# Patient Record
Sex: Female | Born: 1950
Health system: Southern US, Community
[De-identification: ages and names within clinical notes are randomized; demographics above are authoritative.]

## PROBLEM LIST (undated history)

## (undated) DIAGNOSIS — Z923 Personal history of irradiation: Secondary | ICD-10-CM

## (undated) DIAGNOSIS — M8580 Other specified disorders of bone density and structure, unspecified site: Secondary | ICD-10-CM

## (undated) DIAGNOSIS — C50212 Malignant neoplasm of upper-inner quadrant of left female breast: Secondary | ICD-10-CM

## (undated) DIAGNOSIS — I10 Essential (primary) hypertension: Secondary | ICD-10-CM

## (undated) DIAGNOSIS — IMO0001 Reserved for inherently not codable concepts without codable children: Secondary | ICD-10-CM

## (undated) DIAGNOSIS — IMO0002 Reserved for concepts with insufficient information to code with codable children: Secondary | ICD-10-CM

## (undated) HISTORY — DX: Malignant neoplasm of upper-inner quadrant of left female breast: C50.212

## (undated) HISTORY — DX: Reserved for inherently not codable concepts without codable children: IMO0001

## (undated) HISTORY — DX: Other specified disorders of bone density and structure, unspecified site: M85.80

## (undated) HISTORY — DX: Reserved for concepts with insufficient information to code with codable children: IMO0002

---
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
## 1978-06-14 HISTORY — PX: ABDOMINAL HYSTERECTOMY: SHX81

## 2007-08-07 ENCOUNTER — Emergency Department (HOSPITAL_COMMUNITY): Admission: EM | Admit: 2007-08-07 | Discharge: 2007-08-07 | Payer: Self-pay | Admitting: Emergency Medicine

## 2009-03-06 ENCOUNTER — Emergency Department (HOSPITAL_COMMUNITY): Admission: EM | Admit: 2009-03-06 | Discharge: 2009-03-06 | Payer: Self-pay | Admitting: Emergency Medicine

## 2010-03-24 ENCOUNTER — Encounter (INDEPENDENT_AMBULATORY_CARE_PROVIDER_SITE_OTHER): Payer: Self-pay | Admitting: *Deleted

## 2010-03-24 ENCOUNTER — Ambulatory Visit: Payer: Self-pay | Admitting: Internal Medicine

## 2010-03-24 LAB — CONVERTED CEMR LAB
ALT: 12 units/L (ref 0–35)
AST: 16 units/L (ref 0–37)
Basophils Absolute: 0 10*3/uL (ref 0.0–0.1)
Basophils Relative: 0 % (ref 0–1)
CO2: 28 meq/L (ref 19–32)
Calcium: 9.9 mg/dL (ref 8.4–10.5)
Chloride: 104 meq/L (ref 96–112)
Cholesterol: 178 mg/dL (ref 0–200)
Hemoglobin: 13.4 g/dL (ref 12.0–15.0)
Lymphocytes Relative: 41 % (ref 12–46)
Neutro Abs: 3.6 10*3/uL (ref 1.7–7.7)
Neutrophils Relative %: 50 % (ref 43–77)
Platelets: 317 10*3/uL (ref 150–400)
Potassium: 4.6 meq/L (ref 3.5–5.3)
RDW: 14.3 % (ref 11.5–15.5)
Sodium: 144 meq/L (ref 135–145)
TSH: 1.228 microintl units/mL (ref 0.350–4.500)
Total Protein: 7.7 g/dL (ref 6.0–8.3)
VLDL: 23 mg/dL (ref 0–40)

## 2010-05-15 ENCOUNTER — Encounter (INDEPENDENT_AMBULATORY_CARE_PROVIDER_SITE_OTHER): Payer: Self-pay | Admitting: *Deleted

## 2010-05-15 LAB — CONVERTED CEMR LAB: Microalb, Ur: 5.42 mg/dL — ABNORMAL HIGH (ref 0.00–1.89)

## 2010-05-21 ENCOUNTER — Ambulatory Visit (HOSPITAL_COMMUNITY)
Admission: RE | Admit: 2010-05-21 | Discharge: 2010-05-21 | Payer: Self-pay | Source: Home / Self Care | Attending: Internal Medicine | Admitting: Internal Medicine

## 2010-09-18 LAB — CBC
MCV: 88 fL (ref 78.0–100.0)
Platelets: 285 10*3/uL (ref 150–400)
RBC: 5.22 MIL/uL — ABNORMAL HIGH (ref 3.87–5.11)
WBC: 10.3 10*3/uL (ref 4.0–10.5)

## 2010-09-18 LAB — DIFFERENTIAL
Eosinophils Absolute: 0.1 10*3/uL (ref 0.0–0.7)
Lymphocytes Relative: 26 % (ref 12–46)
Lymphs Abs: 2.7 10*3/uL (ref 0.7–4.0)
Monocytes Relative: 6 % (ref 3–12)
Neutrophils Relative %: 67 % (ref 43–77)

## 2010-09-18 LAB — URINALYSIS, ROUTINE W REFLEX MICROSCOPIC
Glucose, UA: NEGATIVE mg/dL
Specific Gravity, Urine: 1.026 (ref 1.005–1.030)
pH: 5.5 (ref 5.0–8.0)

## 2010-09-18 LAB — BASIC METABOLIC PANEL
BUN: 11 mg/dL (ref 6–23)
Chloride: 103 mEq/L (ref 96–112)
Creatinine, Ser: 0.96 mg/dL (ref 0.4–1.2)
GFR calc Af Amer: >60 mL/min (ref >60)
GFR calc non Af Amer: 60 mL/min — ABNORMAL LOW (ref >60)
Potassium: 3.5 mEq/L (ref 3.5–5.1)

## 2010-09-18 LAB — URINE MICROSCOPIC-ADD ON

## 2010-11-25 ENCOUNTER — Emergency Department (HOSPITAL_COMMUNITY)
Admission: EM | Admit: 2010-11-25 | Discharge: 2010-11-25 | Disposition: A | Discharge status: Home / Self Care | Payer: Self-pay | Attending: Emergency Medicine | Admitting: Emergency Medicine

## 2010-11-25 DIAGNOSIS — L2989 Other pruritus: Secondary | ICD-10-CM | POA: Insufficient documentation

## 2010-11-25 DIAGNOSIS — I10 Essential (primary) hypertension: Secondary | ICD-10-CM | POA: Insufficient documentation

## 2010-11-25 DIAGNOSIS — H9209 Otalgia, unspecified ear: Secondary | ICD-10-CM | POA: Insufficient documentation

## 2010-11-25 DIAGNOSIS — H9210 Otorrhea, unspecified ear: Secondary | ICD-10-CM | POA: Insufficient documentation

## 2010-11-25 DIAGNOSIS — L298 Other pruritus: Secondary | ICD-10-CM | POA: Insufficient documentation

## 2010-11-25 DIAGNOSIS — Z79899 Other long term (current) drug therapy: Secondary | ICD-10-CM | POA: Insufficient documentation

## 2010-12-12 ENCOUNTER — Emergency Department (HOSPITAL_COMMUNITY)
Admission: EM | Admit: 2010-12-12 | Discharge: 2010-12-12 | Disposition: A | Discharge status: Home / Self Care | Payer: Self-pay | Attending: Emergency Medicine | Admitting: Emergency Medicine

## 2010-12-12 DIAGNOSIS — L2989 Other pruritus: Secondary | ICD-10-CM | POA: Insufficient documentation

## 2010-12-12 DIAGNOSIS — R21 Rash and other nonspecific skin eruption: Secondary | ICD-10-CM | POA: Insufficient documentation

## 2010-12-12 DIAGNOSIS — I10 Essential (primary) hypertension: Secondary | ICD-10-CM | POA: Insufficient documentation

## 2010-12-12 DIAGNOSIS — IMO0002 Reserved for concepts with insufficient information to code with codable children: Secondary | ICD-10-CM | POA: Insufficient documentation

## 2010-12-12 DIAGNOSIS — L255 Unspecified contact dermatitis due to plants, except food: Secondary | ICD-10-CM | POA: Insufficient documentation

## 2010-12-12 DIAGNOSIS — Z79899 Other long term (current) drug therapy: Secondary | ICD-10-CM | POA: Insufficient documentation

## 2010-12-12 DIAGNOSIS — L298 Other pruritus: Secondary | ICD-10-CM | POA: Insufficient documentation

## 2013-04-08 ENCOUNTER — Emergency Department (HOSPITAL_COMMUNITY)
Admission: EM | Admit: 2013-04-08 | Discharge: 2013-04-08 | Disposition: A | Discharge status: Home / Self Care | Payer: BC Managed Care – PPO | Attending: Emergency Medicine | Admitting: Emergency Medicine

## 2013-04-08 ENCOUNTER — Encounter (HOSPITAL_COMMUNITY): Payer: Self-pay | Admitting: Emergency Medicine

## 2013-04-08 DIAGNOSIS — IMO0002 Reserved for concepts with insufficient information to code with codable children: Secondary | ICD-10-CM | POA: Insufficient documentation

## 2013-04-08 DIAGNOSIS — L0390 Cellulitis, unspecified: Secondary | ICD-10-CM

## 2013-04-08 MED ORDER — CEPHALEXIN 500 MG PO CAPS: 500.0000 mg | ORAL_CAPSULE | Freq: Four times a day (QID) | ORAL | Status: DC

## 2013-04-08 MED ORDER — CEFTRIAXONE SODIUM 1 G IJ SOLR
1.0000 g | Freq: Once | INTRAMUSCULAR | Status: AC
  Administered 2013-04-08: 1 g via INTRAMUSCULAR
  Filled 2013-04-08: qty 10

## 2013-04-08 NOTE — ED Notes (Signed)
Pt presents to department for evaluation of redness/swelling to inner L arm. Ongoing for several days. States she thinks this could be insect bite or bee sting. Respirations unlabored. Pt is alert and oriented x4.

## 2013-04-08 NOTE — ED Provider Notes (Signed)
CSN: [id number]     Arrival date & time 04/08/13  1214 History  This chart was scribed for non-physician practitioner, Wynetta Emery, PA-C,working with Doug Sou, MD, by Karle Plumber, ED Scribe.  This patient was seen in room TR05C/TR05C and the patient's care was started at 12:59 PM.  Chief Complaint  Patient presents with  . Allergic Reaction   The history is provided by the patient. No language interpreter was used.   HPI Comments:  Marissa Williams is a 62 y.o. female who presents to the Emergency Department complaining of an allergic reaction to a bee sting on her left inner arm onset two days ago. Pt states she is not allergic to bees, but she reports worsening itching and swelling. She states that since she is not allergic to bees she did not think she should have it looked at, but with the worsening symptoms she was advised by her supervisor to have it checked. Pt denies fever, nausea, vomiting, or red streaking to the arm. Pt denies any chronic illnesses or allergies to any medications.   No past medical history on file. History reviewed. No pertinent past surgical history. No family history on file. History  Substance Use Topics  . Smoking status: Never Smoker   . Smokeless tobacco: Not on file  . Alcohol Use: No   OB History   Grav Para Term Preterm Abortions TAB SAB Ect Mult Living                 Review of Systems A complete 10 system review of systems was obtained and all systems are negative except as noted in the HPI and PMH.   Allergies  Review of patient's allergies indicates no known allergies.  Home Medications  No current outpatient prescriptions on file. Triage Vitals: BP 155/88  Pulse 97  Temp(Src) 98.3 F (36.8 C) (Oral)  Resp 16  Wt 164 lb 8 oz (74.617 kg)  SpO2 98% Physical Exam  Nursing note and vitals reviewed. Constitutional: She is oriented to person, place, and time. She appears well-developed and well-nourished. No distress.  HENT:   Head: Normocephalic.  Eyes: Conjunctivae and EOM are normal.  Cardiovascular: Normal rate.   Pulmonary/Chest: Effort normal. No stridor.  Musculoskeletal: Normal range of motion.  Neurological: She is alert and oriented to person, place, and time.  Skin:  Expired 10 cm area of cellulitis to left upper medial arm. No signs of abscess or fluctuance.  Psychiatric: She has a normal mood and affect.    ED Course  Procedures (including critical care time) DIAGNOSTIC STUDIES: Oxygen Saturation is 98% on RA, normal by my interpretation.   COORDINATION OF CARE: 01:02 PM- Advised pt to take OTC Motrin for pain and inflammation. Will start pt on oral antibiotic and advised to return with worsened or new symptoms. Outlined area of erythema with pen and advised pt to keep a close eye to see if redness and swelling spread. Pt verbalizes understanding and agrees to plan.  Medications - No data to display  Labs Review Labs Reviewed - No data to display Imaging Review No results found.  EKG Interpretation   None       MDM  No diagnosis found.  Filed Vitals:   04/08/13 1217 04/08/13 1350  BP: 155/88 117/77  Pulse: 97 77  Temp: 98.3 F (36.8 C)   TempSrc: Oral   Resp: 16 15  Weight: 164 lb 8 oz (74.617 kg)   SpO2: 98% 97%  Marissa Williams is a 63 y.o. female with cellulitis to left upper arm after bee sting. No systemic complaints. Area is outlined and patient started on Keflex. We have discussed return precautions extensively. She has verbalized her understanding seems reliable to followup.  Medications  cefTRIAXone (ROCEPHIN) injection 1 g (1 g Intramuscular Given 04/08/13 1327)    Pt is hemodynamically stable, appropriate for, and amenable to discharge at this time. Pt verbalized understanding and agrees with care plan. All questions answered. Outpatient follow-up and specific return precautions discussed.    Discharge Medication List as of 04/08/2013  1:20 PM    START  taking these medications   Details  cephALEXin (KEFLEX) 500 MG capsule Take 1 capsule (500 mg total) by mouth 4 (four) times daily., Starting 04/08/2013, Until Discontinued, Print        I personally performed the services described in this documentation, which was scribed in my presence. The recorded information has been reviewed and is accurate.  Note: Portions of this report may have been transcribed using voice recognition software. Every effort was made to ensure accuracy; however, inadvertent computerized transcription errors may be present    Wynetta Emery, PA-C 04/11/13 [phone]

## 2013-04-12 NOTE — ED Provider Notes (Signed)
Medical screening examination/treatment/procedure(s) were performed by non-physician practitioner and as supervising physician I was immediately available for consultation/collaboration.  EKG Interpretation   None        Doug Sou, MD 04/12/13 [phone]

## 2014-01-17 ENCOUNTER — Other Ambulatory Visit: Payer: Self-pay | Admitting: Obstetrics and Gynecology

## 2014-01-17 DIAGNOSIS — Z1231 Encounter for screening mammogram for malignant neoplasm of breast: Secondary | ICD-10-CM

## 2014-02-12 ENCOUNTER — Ambulatory Visit (HOSPITAL_COMMUNITY)
Admission: RE | Admit: 2014-02-12 | Discharge: 2014-02-12 | Disposition: A | Discharge status: Home / Self Care | Payer: Self-pay | Source: Ambulatory Visit | Attending: Obstetrics and Gynecology | Admitting: Obstetrics and Gynecology

## 2014-02-12 ENCOUNTER — Encounter (HOSPITAL_COMMUNITY): Payer: Self-pay

## 2014-02-12 VITALS — BP 150/82 | Temp 97.3°F | Ht 63.0 in | Wt 165.2 lb

## 2014-02-12 DIAGNOSIS — Z1239 Encounter for other screening for malignant neoplasm of breast: Secondary | ICD-10-CM

## 2014-02-12 DIAGNOSIS — Z1231 Encounter for screening mammogram for malignant neoplasm of breast: Secondary | ICD-10-CM

## 2014-02-12 HISTORY — DX: Essential (primary) hypertension: I10

## 2014-02-12 NOTE — Progress Notes (Signed)
No complaints today.  Pap Smear:  Pap smear not completed today. Last Pap smear was 05/15/2010 and normal. Per patient has no history of an abnormal Pap smear. Patient has a history of a hysterectomy 34 years ago due to a prolapsed uterus. Patient no longer needs Pap smears due to her history of a hysterectomy for benign reasons per BCCCP and ACOG guidelines. Last Pap smear result is in EPIC.  Physical exam: Breasts Right breast is slightly larger than the left breast. No skin abnormalities bilateral breasts. No nipple retraction bilateral breasts. No nipple discharge bilateral breasts. No lymphadenopathy. No lumps palpated bilateral breasts. No complaints of pain or tenderness on exam. Patient escorted to mammography for a screening mammogram.        Pelvic/Bimanual No Pap smear completed today since patient has a history of a hysterectomy for benign reasons. Pap smear not indicated per BCCCP guidelines.

## 2014-02-12 NOTE — Patient Instructions (Signed)
Explained to Marissa Williams that she did not need a Pap smear today due to a history of a hysterectomy for benign reasons. Told patient that she does not need any further Pap smears due to her history of a hysterectomy for benign reasons and no history of an abnormal Pap smear. Let [facility] know [facility] will follow up with her within the next couple weeks with results by letter or phone. Marissa Williams verbalized understanding. Patient escorted to mammography for a screening mammogram.  [name], Arvil Chaco, RN 11:41 AM

## 2014-02-15 ENCOUNTER — Ambulatory Visit: Payer: BC Managed Care – PPO

## 2014-02-15 ENCOUNTER — Other Ambulatory Visit: Payer: BC Managed Care – PPO

## 2014-02-15 ENCOUNTER — Ambulatory Visit (HOSPITAL_BASED_OUTPATIENT_CLINIC_OR_DEPARTMENT_OTHER): Payer: BC Managed Care – PPO

## 2014-02-15 VITALS — BP 144/85 | HR 78 | Temp 98.4°F | Resp 16 | Ht 62.5 in | Wt 164.6 lb

## 2014-02-15 DIAGNOSIS — Z0000 Encounter for general adult medical examination without abnormal findings: Secondary | ICD-10-CM

## 2014-02-15 LAB — HEMOGLOBIN A1C
HEMOGLOBIN A1C: 5.4 % (ref <5.7)
Mean Plasma Glucose: 108 mg/dL (ref <117)

## 2014-02-15 LAB — LIPID PANEL
CHOLESTEROL: 159 mg/dL (ref 0–200)
HDL: 46 mg/dL (ref >39)
LDL Cholesterol: 100 mg/dL — ABNORMAL HIGH (ref 0–99)
TRIGLYCERIDES: 65 mg/dL (ref <150)
Total CHOL/HDL Ratio: 3.5 Ratio
VLDL: 13 mg/dL (ref 0–40)

## 2014-02-15 LAB — GLUCOSE (CC13): GLUCOSE: 104 mg/dL (ref 70–140)

## 2014-02-15 NOTE — Patient Instructions (Signed)
Discussed health assessment with patient. Informed patient of different programs that we have for for nutrition and blood pressure. She will be called with results of lab work and we will then discussed any further follow up the patient needs. . Patient verbalized understanding.

## 2014-02-15 NOTE — Progress Notes (Signed)
Patient is a new patient to the [facility] program and is currently a B[facility]CP patient effective 0/06/2013.   Clinical Measurements: Patient is 5 ft. 21/2 inches, weight 164.6 lbs, waist circumference 32 inches, and hip circumference 42.5 inches.   Medical History: Patient has a history of hypertension and takes HCTZ everyday. Patient does not have a history of diabetes or high cholesterol. Per patient no diagnosed history of coronary heart disease, heart attack, heart failure, stroke/TIA, vascular disease or congenital heart defects.   Blood Pressure, Self-measurement: Patient states has not ever been told to check her blood pressure reason to check Blood pressure outside doctors office.  Nutrition Assessment: Patient stated that eats no fruits every day. Patient states she eats one serving of vegetables a day. Per patient does not eat 3 or more ounces of whole grains daily. Patient doesn't eat two or more servings of fish weekly.  Patient states she does not drink more than 36 ounces or 450 calories of beverages with added sugars weekly. Patient states she drinks a lot of water. Patient stated she does watch her salt intake.   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 Per patient no vigorous exercise.  Smoking Status: Patient quit smoking five years ago and is not around smoking.   Quality of Life Assessment: In assessing patient's quality of life she stated that out of the past 30 days that she has felt her health is good all of them. Patient also stated that in the past 30 days that her mental health was good all days including stress, depression and problems with emotions. Patient did state that out of the past 30 days she felt her physical or mental health had not kept her from doing her usual activities including self-care, work or recreation.   Plan: Lab work will be done today including a lipid  panel, blood glucose, and Hgb A1C. Will call lab results when they are finished. Patient will return for Health Coaching.

## 2014-03-07 ENCOUNTER — Telehealth: Payer: Self-pay

## 2014-03-07 NOTE — Telephone Encounter (Addendum)
Called patient lab results: Bld. Glucose - 104, HgbA1C - 5.4, Cholesterol 159, HDL - 46, LDL - 100, Triglycerides - 65. Discussed the importance of exercise and fiber to improve LDL's. Stated she understood.Patient stated that she needs a PCP for high blood pressure. Per patient has about a months worth of medicine left.  PLAN: Get patient appointment at [facility].

## 2014-04-08 ENCOUNTER — Telehealth: Payer: Self-pay

## 2014-04-08 NOTE — Telephone Encounter (Signed)
Called to tell about doctor's appointment on 04/16/14 at 11:15 AM. Asked patient to return call.

## 2014-04-16 ENCOUNTER — Ambulatory Visit: Payer: Self-pay | Attending: Family Medicine | Admitting: Family Medicine

## 2014-04-16 ENCOUNTER — Encounter: Payer: Self-pay | Admitting: Family Medicine

## 2014-04-16 VITALS — BP 147/90 | HR 83 | Temp 97.7°F | Resp 16 | Ht 63.0 in | Wt 165.0 lb

## 2014-04-16 DIAGNOSIS — Z90710 Acquired absence of both cervix and uterus: Secondary | ICD-10-CM | POA: Insufficient documentation

## 2014-04-16 DIAGNOSIS — I10 Essential (primary) hypertension: Secondary | ICD-10-CM | POA: Insufficient documentation

## 2014-04-16 LAB — COMPLETE METABOLIC PANEL WITH GFR
ALT: 11 U/L (ref 0–35)
AST: 18 U/L (ref 0–37)
Albumin: 4 g/dL (ref 3.5–5.2)
Alkaline Phosphatase: 53 U/L (ref 39–117)
BUN: 12 mg/dL (ref 6–23)
CALCIUM: 9.6 mg/dL (ref 8.4–10.5)
CHLORIDE: 104 meq/L (ref 96–112)
CO2: 28 meq/L (ref 19–32)
CREATININE: 0.68 mg/dL (ref 0.50–1.10)
GFR, Est Non African American: >89 mL/min
Glucose, Bld: 87 mg/dL (ref 70–99)
Potassium: 4.4 mEq/L (ref 3.5–5.3)
Sodium: 140 mEq/L (ref 135–145)
Total Bilirubin: 0.5 mg/dL (ref 0.2–1.2)
Total Protein: 7.1 g/dL (ref 6.0–8.3)

## 2014-04-16 MED ORDER — HYDROCHLOROTHIAZIDE 25 MG PO TABS: 25.0000 mg | ORAL_TABLET | Freq: Every day | ORAL | Status: DC

## 2014-04-16 NOTE — Patient Instructions (Signed)
Marissa Williams,  Thank you for coming in today. It was a pleasure meeting you. I look forward to being your primary doctor.  1. Hypertension  Complete metabolic panel  to check sodium Increases HCTZ to 25 mg daily    F/u in 3-4 weeks for RN BP check   F/u with me in 6 months

## 2014-04-16 NOTE — Progress Notes (Signed)
   Subjective:    Patient ID: Marissa Williams, female    DOB: 04/29/1951, 63 y.o.   MRN: [id number] CC: establish care  HPI 1. CHRONIC HYPERTENSION since 2008   Disease Monitoring  Blood pressure range: checks at work sometimes 130/70  Chest pain: no   Dyspnea: no   Claudication: no   Medication compliance: yes  Medication Side Effects  Lightheadedness: no   Urinary frequency: no   Edema: no   Impotence: no   Preventitive Healthcare:  Exercise: yes, walks 1-2 times every 2-3 weeks   Diet Pattern: 3 meals per day   Salt Restriction: yes   Surg Hx: s/p hysterectomy for uterine prolapse Fam Hx: negative for CAD  Soc hx: non smoker  Review of Systems As per HPI     Objective:   Physical Exam BP 147/90 mmHg  Pulse 83  Temp(Src) 97.7 F (36.5 C) (Oral)  Resp 16  Ht 5\' 3"  (1.6 m)  Wt 165 lb (74.844 kg)  BMI 29.24 kg/m2  SpO2 99%  BP Readings from Last 3 Encounters:  04/16/14 147/90  02/15/14 144/85  02/12/14 150/82  General appearance: alert, cooperative and no distress  Lungs: clear to auscultation bilaterally Heart: regular rate and rhythm, S1, S2 normal, no murmur, click, rub or gallop Extremities: extremities normal, atraumatic, no cyanosis or edema       Assessment & Plan:

## 2014-04-16 NOTE — Progress Notes (Signed)
Establish Care Pt stated doing good and need medication refills Rx HZTC 12.5MG 

## 2014-04-16 NOTE — Assessment & Plan Note (Signed)
A: chronic HTN above goal.  Meds: compliant  P; CMP to check sodium Increases HCTZ to 25 mg daily  F/u in 3-4 weeks for RN BP check  F/u with me in 6 months

## 2014-04-19 ENCOUNTER — Telehealth: Payer: Self-pay | Admitting: *Deleted

## 2014-04-19 NOTE — Telephone Encounter (Signed)
-----   Message from Minerva Ends, MD sent at 04/17/2014  9:07 AM EST ----- Normal CMP Continue medication regimen Repeat labs in 1 year recommended

## 2014-04-19 NOTE — Telephone Encounter (Signed)
Pt aware of lab results Advice to continue medication as prescribe

## 2014-06-14 HISTORY — PX: BREAST LUMPECTOMY: SHX2

## 2014-10-30 ENCOUNTER — Telehealth: Payer: Self-pay

## 2014-10-30 NOTE — Telephone Encounter (Signed)
Called to follow up for health coaching and left message to return call,.

## 2014-11-07 ENCOUNTER — Telehealth: Payer: Self-pay

## 2014-11-07 NOTE — Telephone Encounter (Signed)
Patient returned call and left message. Upon calling the patient back, the patient stated that she had a job with [employer]. Patient also said that she has insurance. Reminded the patient that would no longer be eligible for [facility]CP or [employer]

## 2014-12-17 ENCOUNTER — Other Ambulatory Visit: Payer: Self-pay

## 2014-12-17 DIAGNOSIS — Z1231 Encounter for screening mammogram for malignant neoplasm of breast: Secondary | ICD-10-CM

## 2015-01-08 ENCOUNTER — Ambulatory Visit: Payer: Self-pay | Admitting: Family Medicine

## 2015-01-16 ENCOUNTER — Ambulatory Visit: Payer: 59 | Attending: Family Medicine | Admitting: Family Medicine

## 2015-01-16 ENCOUNTER — Encounter: Payer: Self-pay | Admitting: Family Medicine

## 2015-01-16 VITALS — BP 110/71 | HR 93 | Temp 98.7°F | Resp 16 | Ht 63.0 in | Wt 155.0 lb

## 2015-01-16 DIAGNOSIS — H6121 Impacted cerumen, right ear: Secondary | ICD-10-CM | POA: Insufficient documentation

## 2015-01-16 DIAGNOSIS — N63 Unspecified lump in breast: Secondary | ICD-10-CM | POA: Diagnosis not present

## 2015-01-16 DIAGNOSIS — N951 Menopausal and female climacteric states: Secondary | ICD-10-CM | POA: Insufficient documentation

## 2015-01-16 DIAGNOSIS — N6322 Unspecified lump in the left breast, upper inner quadrant: Secondary | ICD-10-CM | POA: Insufficient documentation

## 2015-01-16 MED ORDER — CARBAMIDE PEROXIDE 6.5 % OT SOLN: 5.0000 [drp] | Freq: Two times a day (BID) | OTIC | Status: DC

## 2015-01-16 MED ORDER — VENLAFAXINE HCL ER 75 MG PO CP24: 75.0000 mg | ORAL_CAPSULE | Freq: Every day | ORAL | Status: DC

## 2015-01-16 NOTE — Progress Notes (Signed)
Annual physical Mass on lt breast no pain

## 2015-01-16 NOTE — Assessment & Plan Note (Signed)
Hot flashes: Due to decline in hormone levels It sounds like what you had before was as estrogen patch called Alora, Climara, or Vivelle-Dot Will start non-hormonal Effexor 75 mg once daily Natural options are Evening primrose or black cohosh

## 2015-01-16 NOTE — Assessment & Plan Note (Signed)
R ear wax:  Debrox drop for 5 days to soften the wax followed by irrigation is recommended

## 2015-01-16 NOTE — Progress Notes (Signed)
   Subjective:    Patient ID: Marissa Williams, female    DOB: February 01, 1951, 64 y.o.   MRN: [id number] CC: wellness visit    HPI 64 yo F presents for f/u visit  1. L breast mass: noted in L upper inner breast one month ago on self breast exam. No pain or tenderness. No nipple discharge.   2. Hot flashes: daily and nightly, unchanged for many years. Had a patch but she cannot remember the name of the medicine for hot flashes but d/cd this due to cost. Would like to avoid estrogens.    History  Substance Use Topics  . Smoking status: Never Smoker   . Smokeless tobacco: Never Used  . Alcohol Use: No   Review of Systems  Constitutional: Negative for fever, chills and unexpected weight change.  Eyes: Negative for visual disturbance.  Respiratory: Negative for shortness of breath.   Cardiovascular: Negative for chest pain.  Gastrointestinal: Negative for abdominal pain and blood in stool.  Genitourinary: Negative for dysuria, vaginal discharge and pelvic pain.  Musculoskeletal: Negative for myalgias, back pain, joint swelling, arthralgias, gait problem and neck pain.  Skin: Negative for rash.  Allergic/Immunologic: Negative for immunocompromised state.  Neurological: Negative for dizziness and headaches.  Hematological: Negative for adenopathy. Does not bruise/bleed easily.  Psychiatric/Behavioral: Negative for suicidal ideas and dysphoric mood.       Objective:   Physical Exam  Constitutional: She is oriented to person, place, and time. She appears well-developed and well-nourished. No distress.  HENT:  Head: Normocephalic and atraumatic.  Right Ear: External ear normal.  Left Ear: Tympanic membrane, external ear and ear canal normal. Left ear injected TM: cerumen in R ear   Eyes: Conjunctivae and EOM are normal. Pupils are equal, round, and reactive to light.  Neck: Normal range of motion. Neck supple. No thyromegaly present.  Cardiovascular: Normal rate, regular rhythm, normal heart  sounds and intact distal pulses.   Pulmonary/Chest: Effort normal and breath sounds normal. Right breast exhibits no inverted nipple, no mass, no nipple discharge, no skin change and no tenderness. Left breast exhibits mass. Left breast exhibits no inverted nipple, no skin change and no tenderness. Breasts are asymmetrical.    Abdominal: Soft. Bowel sounds are normal. She exhibits no mass. There is no tenderness.  Musculoskeletal: Normal range of motion. She exhibits no edema or tenderness.  Lymphadenopathy:    She has no cervical adenopathy.  Neurological: She is alert and oriented to person, place, and time.  Skin: Skin is warm and dry. No rash noted.  Psychiatric: She has a normal mood and affect.  BP 110/71 mmHg  Pulse 93  Temp(Src) 98.7 F (37.1 C) (Oral)  Resp 16  Ht 5\' 3"  (1.6 m)  Wt 155 lb (70.308 kg)  BMI 27.46 kg/m2  SpO2 98%    Assessment & Plan:

## 2015-01-16 NOTE — Patient Instructions (Signed)
Marissa Williams,  Thank you for coming in today It was very nice to see you today  1. Breast lump: area of increase density Diagnostic mammogram and ultrasound ordered  2. Hot flashes: Due to decline in hormone levels It sounds like what you had before was as estrogen patch called Alora, Climara, or Vivelle-Dot Will start non-hormonal Effexor 75 mg once daily Natural options are Evening primrose or black cohosh   3. R ear wax:  Debrox drop for 5 days to soften the wax followed by irrigation is recommended  You can return for RN for visit in 1-2 weeks for R ear irrigation Follow up with me in 6 months  Flu shot are available in late September/early October  Dr. Adrian Blackwater

## 2015-01-16 NOTE — Assessment & Plan Note (Signed)
Breast lump: area of increase density Patient does have a short hx of exogenous estrogen for hot flashes  Diagnostic mammogram and ultrasound ordered

## 2015-01-21 ENCOUNTER — Ambulatory Visit
Admission: RE | Admit: 2015-01-21 | Discharge: 2015-01-21 | Disposition: A | Discharge status: Home / Self Care | Payer: 59 | Source: Ambulatory Visit | Attending: Family Medicine | Admitting: Family Medicine

## 2015-01-21 ENCOUNTER — Other Ambulatory Visit: Payer: Self-pay | Admitting: Family Medicine

## 2015-01-21 DIAGNOSIS — N6322 Unspecified lump in the left breast, upper inner quadrant: Secondary | ICD-10-CM

## 2015-01-23 ENCOUNTER — Telehealth: Payer: Self-pay | Admitting: *Deleted

## 2015-01-23 ENCOUNTER — Telehealth: Payer: Self-pay | Admitting: Family Medicine

## 2015-01-23 DIAGNOSIS — C50912 Malignant neoplasm of unspecified site of left female breast: Secondary | ICD-10-CM

## 2015-01-23 NOTE — Telephone Encounter (Signed)
Called patient. Left VM Requested call back if she has questions about her results or care plan:  Of note, patient is aware that breast biopsy was positive for grade 2 invasive ductal carcinoma in L breast with referral to cancer center, appt 01/29/2015.

## 2015-01-23 NOTE — Telephone Encounter (Signed)
Left vm for pt to return call regarding [facility] appt for 01/29/15 

## 2015-01-24 ENCOUNTER — Telehealth: Payer: Self-pay | Admitting: *Deleted

## 2015-01-24 NOTE — Telephone Encounter (Signed)
Left another vm for pt to return call regarding [facility] appt. Contact information given.

## 2015-01-27 ENCOUNTER — Encounter: Payer: Self-pay | Admitting: *Deleted

## 2015-01-27 ENCOUNTER — Telehealth: Payer: Self-pay | Admitting: *Deleted

## 2015-01-27 DIAGNOSIS — C50212 Malignant neoplasm of upper-inner quadrant of left female breast: Secondary | ICD-10-CM

## 2015-01-27 DIAGNOSIS — Z170 Estrogen receptor positive status [ER+]: Secondary | ICD-10-CM

## 2015-01-27 HISTORY — DX: Malignant neoplasm of upper-inner quadrant of left female breast: C50.212

## 2015-01-27 NOTE — Telephone Encounter (Signed)
Left detailed msg on identified vm of time and location for [facility] appt.

## 2015-01-29 ENCOUNTER — Ambulatory Visit (HOSPITAL_BASED_OUTPATIENT_CLINIC_OR_DEPARTMENT_OTHER): Payer: 59 | Admitting: Oncology

## 2015-01-29 ENCOUNTER — Other Ambulatory Visit (HOSPITAL_BASED_OUTPATIENT_CLINIC_OR_DEPARTMENT_OTHER): Payer: 59

## 2015-01-29 ENCOUNTER — Encounter: Payer: Self-pay | Admitting: Physical Therapy

## 2015-01-29 ENCOUNTER — Encounter: Payer: Self-pay | Admitting: General Practice

## 2015-01-29 ENCOUNTER — Ambulatory Visit
Admission: RE | Admit: 2015-01-29 | Discharge: 2015-01-29 | Disposition: A | Discharge status: Home / Self Care | Payer: 59 | Source: Ambulatory Visit | Attending: Radiation Oncology | Admitting: Radiation Oncology

## 2015-01-29 ENCOUNTER — Ambulatory Visit: Payer: 59

## 2015-01-29 ENCOUNTER — Encounter: Payer: Self-pay | Admitting: Nurse Practitioner

## 2015-01-29 ENCOUNTER — Ambulatory Visit: Payer: 59 | Attending: General Surgery | Admitting: Physical Therapy

## 2015-01-29 ENCOUNTER — Encounter: Payer: Self-pay | Admitting: Oncology

## 2015-01-29 ENCOUNTER — Other Ambulatory Visit: Payer: Self-pay | Admitting: General Surgery

## 2015-01-29 VITALS — BP 149/75 | HR 88 | Temp 97.9°F | Resp 18 | Ht 63.0 in | Wt 150.7 lb

## 2015-01-29 DIAGNOSIS — C50212 Malignant neoplasm of upper-inner quadrant of left female breast: Secondary | ICD-10-CM

## 2015-01-29 DIAGNOSIS — M25512 Pain in left shoulder: Secondary | ICD-10-CM | POA: Insufficient documentation

## 2015-01-29 DIAGNOSIS — Z170 Estrogen receptor positive status [ER+]: Secondary | ICD-10-CM | POA: Diagnosis not present

## 2015-01-29 DIAGNOSIS — C50912 Malignant neoplasm of unspecified site of left female breast: Secondary | ICD-10-CM

## 2015-01-29 DIAGNOSIS — M25612 Stiffness of left shoulder, not elsewhere classified: Secondary | ICD-10-CM | POA: Diagnosis present

## 2015-01-29 DIAGNOSIS — R293 Abnormal posture: Secondary | ICD-10-CM | POA: Diagnosis present

## 2015-01-29 LAB — CBC WITH DIFFERENTIAL/PLATELET
BASO%: 0.6 % (ref 0.0–2.0)
Basophils Absolute: 0 10*3/uL (ref 0.0–0.1)
EOS%: 1.3 % (ref 0.0–7.0)
Eosinophils Absolute: 0.1 10*3/uL (ref 0.0–0.5)
HCT: 39.8 % (ref 34.8–46.6)
HEMOGLOBIN: 13.8 g/dL (ref 11.6–15.9)
LYMPH%: 37.3 % (ref 14.0–49.7)
MCH: 28.4 pg (ref 25.1–34.0)
MCHC: 34.6 g/dL (ref 31.5–36.0)
MCV: 82.2 fL (ref 79.5–101.0)
MONO#: 0.5 10*3/uL (ref 0.1–0.9)
MONO%: 6.9 % (ref 0.0–14.0)
NEUT%: 53.9 % (ref 38.4–76.8)
NEUTROS ABS: 3.7 10*3/uL (ref 1.5–6.5)
Platelets: 304 10*3/uL (ref 145–400)
RBC: 4.84 10*6/uL (ref 3.70–5.45)
RDW: 13.5 % (ref 11.2–14.5)
WBC: 6.8 10*3/uL (ref 3.9–10.3)
lymph#: 2.6 10*3/uL (ref 0.9–3.3)

## 2015-01-29 LAB — COMPREHENSIVE METABOLIC PANEL (CC13)
ALBUMIN: 4 g/dL (ref 3.5–5.0)
ALK PHOS: 56 U/L (ref 40–150)
ALT: 13 U/L (ref 0–55)
AST: 17 U/L (ref 5–34)
Anion Gap: 10 mEq/L (ref 3–11)
BUN: 16.8 mg/dL (ref 7.0–26.0)
CO2: 27 mEq/L (ref 22–29)
Calcium: 9.5 mg/dL (ref 8.4–10.4)
Chloride: 104 mEq/L (ref 98–109)
Creatinine: 0.8 mg/dL (ref 0.6–1.1)
EGFR: 87 mL/min/{1.73_m2} — ABNORMAL LOW (ref >90)
GLUCOSE: 159 mg/dL — AB (ref 70–140)
POTASSIUM: 3.4 meq/L — AB (ref 3.5–5.1)
SODIUM: 141 meq/L (ref 136–145)
TOTAL PROTEIN: 7.8 g/dL (ref 6.4–8.3)
Total Bilirubin: 0.56 mg/dL (ref 0.20–1.20)

## 2015-01-29 NOTE — Therapy (Signed)
[place], [place], [address] Phone: [phone]   Fax:  [phone]  Physical Therapy Evaluation  Patient Details  Name: Marissa Williams MRN: [id number] Date of Birth: September 29, 1950 Referring Provider:  Excell Seltzer, MD  Encounter Date: 01/29/2015      PT End of Session - 01/29/15 1609    Visit Number 1   Number of Visits 1   PT Start Time 1420   PT Stop Time 1445   PT Time Calculation (min) 25 min   Activity Tolerance Patient tolerated treatment well   Behavior During Therapy [facility] for tasks assessed/performed      Past Medical History  Diagnosis Date  . Hypertension Dx 2008  . Breast cancer of upper-inner quadrant of left female breast 01/27/2015    Past Surgical History  Procedure Laterality Date  . Tubal ligation  1970  . Abdominal hysterectomy  1980    uterine prolapse     There were no vitals filed for this visit.  Visit Diagnosis:  Carcinoma of left breast upper inner quadrant - Plan: PT plan of care cert/re-cert  Abnormal posture - Plan: PT plan of care cert/re-cert  Left shoulder pain - Plan: PT plan of care cert/re-cert  Shoulder stiffness, left - Plan: PT plan of care cert/re-cert      Subjective Assessment - 01/29/15 1556    Subjective Patient was seen today for a baseline assessment of her newly diagnosed left breast cancer   Patient is accompained by: Family member   Pertinent History Patient was diagnosed on 01/22/15 with left upper inner quadrant breast cancer.  It is ER positive, PR negative, and HER2 negative.  The Ki67 is 20%.   Patient Stated Goals Reduce lymphedema risk and learn post op shoulder ROM HEP   Currently in Pain? 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  No   Is the patient reluctant to leave their home because of a fear of falling?  No   Home Environment   Living Environment Private residence   Living Arrangements Alone   Available Help at Discharge Family   Prior Function   Level of Independence Independent   Vocation Full time employment   Psychologist, counselling at [place] She does not exercise   Cognition   Overall Cognitive Status Within Functional Limits for tasks assessed   Posture/Postural Control   Posture/Postural Control Postural limitations   Postural Limitations Rounded Shoulders;Forward head  ROM / Strength   AROM / PROM / Strength AROM;Strength   AROM   AROM Assessment Site Shoulder   Right/Left Shoulder Right;Left   Right Shoulder Extension 62 Degrees   Right Shoulder Flexion 152 Degrees   Right Shoulder ABduction 175 Degrees   Right Shoulder Internal Rotation 70 Degrees   Right Shoulder External Rotation 74 Degrees   Left Shoulder Extension 58 Degrees   Left Shoulder Flexion 135 Degrees   Left Shoulder ABduction 146 Degrees   Left Shoulder Internal Rotation 37 Degrees  Pain and tightness with left internal rotation   Left Shoulder External Rotation 65 Degrees   Strength   Overall Strength Within functional limits for tasks performed   Strength Assessment Site  Shoulder   Right/Left Shoulder Right           LYMPHEDEMA/ONCOLOGY QUESTIONNAIRE - 01/29/15 1605    Type   Cancer Type Left breast cancer   Lymphedema Assessments   Lymphedema Assessments Upper extremities   Right Upper Extremity Lymphedema   10 cm Proximal to Olecranon Process 28.4 cm   Olecranon Process 24.3 cm   10 cm Proximal to Ulnar Styloid Process 21.2 cm   Just Proximal to Ulnar Styloid Process 15.9 cm   Across Hand at [employer] 19.7 cm   At [place] of 2nd Digit 6.4 cm   Left Upper Extremity Lymphedema   10 cm Proximal to Olecranon Process 28.4 cm   Olecranon Process 24.4 cm   10 cm Proximal to Ulnar Styloid Process 21 cm   Just Proximal to Ulnar Styloid Process 15.8 cm   Across Hand at [employer] 19.6 cm   At [place] of 2nd Digit 6.3 cm      Patient was instructed today in a home exercise program today for post op shoulder range of motion. These included active assist shoulder flexion in sitting, scapular retraction, wall walking with shoulder abduction, and hands behind head external rotation.  She was encouraged to do these twice a day, holding 3 seconds and repeating 5 times when permitted by her physician.          PT Education - 01/29/15 1607    Education provided Yes   Education Details Post op shoulder ROM HEP and lymphedema risk reduction   Person(s) Educated Patient   Methods Explanation;Demonstration;Handout   Comprehension Returned demonstration;Verbalized understanding              Breast Clinic Goals - 01/29/15 1618    Patient will be able to verbalize understanding of pertinent lymphedema risk reduction practices relevant to her diagnosis specifically related to skin care.   Time 1   Period Days   Status Achieved   Patient will be able to return demonstrate and/or verbalize understanding of the post-op home exercise program related to regaining shoulder range of motion.   Time 1   Period Days   Status Achieved   Patient will be  able to verbalize understanding of the importance of attending the postoperative After Breast Cancer Class for further lymphedema risk reduction education and therapeutic exercise.   Time 1   Period Days   Status Achieved              Plan - 01/29/15 1613    Clinical Impression Statement Patient was diagnosed on 01/22/15 with left upper inner quadrant breast cancer.  It is ER positive, PR negative, and HER2 negative.  The Ki67 is 20%.  She is planning to have a left lumpectomy with  a sentinel node biopsy followed by Oncotype testing, radiation and anti-estrogen therapy.  She may benefit from post op T to regain shoulder ROM and strength and reduce lymphedema risk.   Pt will benefit from skilled therapeutic intervention in order to improve on the following deficits Decreased range of motion;Impaired UE functional use;Pain;Decreased knowledge of precautions;Decreased strength   Rehab Potential Excellent   Clinical Impairments Affecting Rehab Potential none   PT Frequency One time visit   PT Treatment/Interventions Patient/family education;Therapeutic exercise   Consulted and Agree with Plan of Care Patient;Family member/caregiver   Family Member Consulted 2 adult sons     Patient will follow up at outpatient cancer rehab if needed following surgery.  If the patient requires physical therapy at that time, a specific plan will be dictated and sent to the referring physician for approval. The patient was educated today on appropriate basic range of motion exercises to begin post operatively and the importance of attending the After Breast Cancer class following surgery.  Patient was educated today on lymphedema risk reduction practices as it pertains to recommendations that will benefit the patient immediately following surgery.  She verbalized good understanding.  No additional physical therapy is indicated at this time.       Problem List Patient Active Problem List   Diagnosis Date Noted   . Breast cancer of upper-inner quadrant of left female breast 01/27/2015  . Right ear impacted cerumen 01/16/2015  . Hot flashes, menopausal 01/16/2015  . Essential hypertension 04/16/2014   Annia Friendly, PT 01/29/2015 4:20 PM  [place], [place], [address] Phone: [phone]   Fax:  [phone]

## 2015-01-29 NOTE — Progress Notes (Signed)
Marissa Williams is a very pleasant 64 y.o. female from [place], [place] with newly diagnosed grade 1-2 invasive ductal carcinoma of the left breast.  Biopsy results revealed the tumor's prognostic profile is ER positive, PR negative, and HER2/neu negative. Ki67 is 20%.  She presents today with her sons to the [facility]) for treatment consideration and recommendations from the breast surgeon, radiation oncologist, and medical oncologist.     I briefly met with Marissa Williams and her sons during her [facility] visit today. We discussed the purpose of the Survivorship Clinic, which will include monitoring for recurrence, coordinating completion of age and gender-appropriate cancer screenings, promotion of overall wellness, as well as managing potential late/long-term side effects of anti-cancer treatments.    The treatment plan for Marissa Williams will likely include surgery, radiation therapy, and anti-estrogen therapy.  As of today, the intent of treatment for Marissa Williams is cure, therefore she will be eligible for the Survivorship Clinic upon her completion of treatment.  Her survivorship care plan (SCP) document will be drafted and updated throughout the course of her treatment trajectory. She will receive the SCP in an office visit with myself in the Survivorship Clinic once she has completed treatment.   Marissa Williams was encouraged to ask questions and all questions were answered to her satisfaction.  She was given my business card and encouraged to contact me with any concerns regarding survivorship.  I look forward to participating in her care.   Kenn File, [place] [phone]

## 2015-01-29 NOTE — Progress Notes (Signed)
Checked in new patient for [facility]. Pt made pymt today of $40 for copay. Pt was given breast packet. Pt also given my card for any financial questions or concerns. Pt also given ACS application to fill out at earliest convenience.

## 2015-01-29 NOTE — Progress Notes (Signed)
[facility] Psychosocial Distress Screening Spiritual Care  Met with Marissa Williams and sons Marissa Williams and Marissa Williams at [facility] to introduce [facility] team/resources, reviewing distress screen per protocol.  The patient scored a 0 on the Psychosocial Distress Thermometer which indicates mild distress. Also assessed for distress and other psychosocial needs.   ON[facility]N DISTRESS SCREENING 01/29/2015  Screening Type Initial Screening  Distress experienced in past week (1-10) 0  Information Concerns Type Lack of info about treatment;Lack of info about diagnosis;Lack of info about complementary therapy choices;Lack of info about maintaining fitness  Referral to support programs Yes  Other Spiritual Care, Alight Guides   Per pt, information concerns resolved through [facility]  Marissa Williams reports good support from church and family (has several grandchildren and great grands).  She is very excited about support programming.  Marissa Williams was buoyant and built rapport easily (with much genuine laughter), welcoming opportunity to share her story and to make space for her sons to process their feelings.  Provided pastoral presence, reflective listening, normalization of feelings, and encouragement.    Follow up needed: Yes.  Marissa Williams refer to [facility] per pt request.  Pt/family aware of ongoing chaplain/support team availability; Marissa Williams plans to call me.  Please also page as needs arise.    [place], [place] Pager [phone] Voicemail  [phone]

## 2015-01-29 NOTE — Progress Notes (Signed)
Radiation Oncology         [phone] ________________________________  Initial Outpatient Consultation  Name: Marissa Williams MRN: [id number]  Date: 01/29/2015  DOB: August 05, 1950  CX:KGYJEHU, Lennox Laity, MD  Excell Seltzer, MD   REFERRING PHYSICIAN: Excell Seltzer, MD  DIAGNOSIS: The encounter diagnosis was Breast cancer of upper-inner quadrant of left female breast.   invasive mammary carcinoma,  Grade II, ER+ PR(-) HER-2(-) Ki67 20%  HISTORY OF PRESENT ILLNESS::Marissa Williams is a 64 y.o. female who is here out of the courtesy of Dr. Excell Seltzer.  She is seen in the multidisciplinary breast clinic.  Patient noticed a palpable mass in the upper medial left breast 4-6 weeks ago. She underwent an ultrasound on 01/21/15 which revealed a 9 mm mass in the upper inner quadrant of the left breast. A biopsy was performed on 8/9 revealing invasive mammary carcinoma Grade II of the left breast. This was found to be ER+, PR(-), HER-2(-), Ki67 20%. Pathology report included below in laboratory data.      PREVIOUS RADIATION THERAPY: No  PAST MEDICAL HISTORY:  has a past medical history of Hypertension (Dx 2008) and Breast cancer of upper-inner quadrant of left female breast (01/27/2015).    PAST SURGICAL HISTORY: Past Surgical History  Procedure Laterality Date  . Tubal ligation  1970  . Abdominal hysterectomy  1980    uterine prolapse     FAMILY HISTORY: family history includes Alzheimer's disease in her father; Cancer in her mother. There is no history of Heart disease or Diabetes.  SOCIAL HISTORY:  reports that she has never smoked. She has never used smokeless tobacco. She reports that she drinks alcohol. She reports that she does not use illicit drugs.  ALLERGIES: Review of patient's allergies indicates no known allergies.  MEDICATIONS:  Current Outpatient Prescriptions  Medication Sig Dispense Refill  . hydrochlorothiazide (HYDRODIURIL) 25 MG tablet Take 1 tablet (25 mg total) by mouth  daily. 90 tablet 3   No current facility-administered medications for this encounter.    REVIEW OF SYSTEMS:  A 15 point review of systems is documented in the electronic medical record. This was obtained by the nursing staff. However, I reviewed this with the patient to discuss relevant findings and make appropriate changes. She denies any pain within the left or right breast, nipple discharge or bleeding. The patient denies any new bony pain headaches dizziness or blurred vision.   PHYSICAL EXAM:  Vitals with BMI 01/29/2015  Height $Remov'5\' 3"'fGkeka$   Weight 150 lbs 11 oz  BMI 31.4  Systolic 970  Diastolic 75  Pulse 88  Respirations 18   No palpable cervical or supraclavicular adenopathy. Lungs are clear to auscultation. Heart has regular rhythm and rate. Both breasts are pendulous. Right breast reveals no palpable mass or nipple discharge.  Left breast in the high upper inner quadrant there is an approximately 1 cm mass. No other palpable mass in the left breast or any nipple discharge or bleeding.   ECOG = 0  LABORATORY DATA:  Lab Results  Component Value Date   WBC 6.8 01/29/2015   HGB 13.8 01/29/2015   HCT 39.8 01/29/2015   MCV 82.2 01/29/2015   PLT 304 01/29/2015   NEUTROABS 3.7 01/29/2015   Lab Results  Component Value Date   NA 141 01/29/2015   K 3.4* 01/29/2015   CL 104 04/16/2014   CO2 27 01/29/2015   GLUCOSE 159* 01/29/2015   CREATININE 0.8 01/29/2015   CALCIUM 9.5 01/29/2015   Addendum FINAL  for MYKAH, BELLOMO [phone]) Patient: Marissa Williams Collected: 01/21/2015 Client: [facility] Accession: [id number] Received: 01/21/2015 Curlene Dolphin, MD DOB: 11/04/1950 Age: 56 Gender: F Reported: 01/23/2015 [place] Patient Ph: [phone] MRN #: [id number] [place], [place] [address] Client Acc#: Chart #: [id number] Phone: [phone] Fax: CC: [facility] CC CC: Nadean Corwin REPORT OF SURGICAL PATHOLOGY REASON FOR ADDENDUM, AMENDMENT OR  CORRECTION: [id number]: Addendum issued to comment on results of immunohistochemistry for E-cadherin. 01/23/15 08:58:32 AM (ecj) ADDITIONAL INFORMATION: FLUORESCENCE IN-SITU HYBRIDIZATION Results: HER2 - NEGATIVE RATIO OF HER2/CEP17 SIGNALS 1.48 AVERAGE HER2 COPY NUMBER PER CELL 2.00 Reference Range: NEGATIVE HER2/CEP17 Ratio <2.0 and average HER2 copy number <4.0 EQUIVOCAL HER2/CEP17 Ratio <2.0 and average HER2 copy number >=4.0 and <6.0 POSITIVE HER2/CEP17 Ratio >=2.0 or <2.0 and average HER2 copy number >=6.0 Enid Cutter MD Pathologist, Electronic Signature ( Signed 01/28/2015) PROGNOSTIC INDICATORS - ACIS Results: IMMUNOHISTOCHEMICAL AND MORPHOMETRIC ANALYSIS BY THE AUTOMATED CELLULAR IMAGING SYSTEM (ACIS) Estrogen Receptor: 100%, POSITIVE, STRONG STAINING INTENSITY (PERFORMED MANUALLY) Progesterone Receptor: 0%, NEGATIVE (PERFORMED MANUALLY) Proliferation Marker Ki67: 20% (PERFORMED MANUALLY) COMMENT: The negative hormone receptor study(ies) in this case has internal positive control. REFERENCE RANGE 1 of 3 Amended copy Addendum FINAL for Marissa Williams [phone]) ADDITIONAL INFORMATION:(continued) ESTROGEN RECEPTOR NEGATIVE <1% POSITIVE =>1% PROGESTERONE RECEPTOR NEGATIVE <1% POSITIVE =>1% All controls stained appropriately Claudette Laws MD Pathologist, Electronic Signature ( Signed 01/23/2015) FINAL DIAGNOSIS Diagnosis Breast, left, needle core biopsy, 10:30 o'clock, 8 to 9 CMFN - INVASIVE MAMMARY CARCINOMA. Microscopic Comment The features favor grade 2 invasive ductal carcinoma. E-cadherin will be performed. Breast prognostic profile will be performed. Called to [facility] on 01/22/15. (JDP:gt, 01/22/15) Addendum: Immunohistochemistry for E-cadherin shows diffuse strong positivity consistent with ductal carcinoma. (JDP:ecj 01/23/2015) Claudette Laws MD Pathologist, Electronic Signature (Case signed 01/22/2015) Corrected Report  Signer Claudette Laws MD Pathologist, Electronic Signature (Case signed 01/23/2015) Specimen Gross and Clinical Information Specimen Comment In formalin 3:45, extracted <1 min; palp mass upper inner quadrant Specimen(s) Obtained: Breast, left, needle core biopsy, 10:30 o'clock, 8 to 9 CMFN 2 of 3 Amended copy Addendum FINAL for BRETTANY, SYDNEY [phone]) Specimen Clinical Information Palp mass upper inner quadrant Gross Received in formalin are 3 cores of soft, tan-red tissue which measure 0.8 x 0.3 x 0.3 cm and up to 1.7 x 0.3 x 0.2 cm. Time in Formalin 3:45 pm. Submitted in toto in 1 block(s) Stain(s) used in Diagnosis: The following stain(s) were used in diagnosing the case: Her2 FISH, PR-ACIS, KI-67-ACIS, ER-ACIS, E-CAD. The control(s) stained appropriately. Disclaimer HER2 IQFISH pharmDX (code [id number]) is a direct fluorescence in-situ hybridization assay designed to quantitatively determine HER2 gene amplification in formalin-fixed, paraffin-embedded tissue specimens. It is performed at [facility] and is reported using ASCO/CAP scoring criteria published in 2013. Some of these immunohistochemical stains may have been developed and the performance characteristics determined by [facility]. Some may not have been cleared or approved by the U.S. Food and Drug Administration. The FDA has determined that such clearance or approval is not necessary. This test is used for clinical purposes. It should not be regarded as investigational or for research. This laboratory is certified under the [place] (CLIA-88) as qualified to perform high complexity clinical laboratory testing. PR progesterone receptor (PgR 636), immunohistochemical stains are performed on formalin fixed, paraffin embedded tissue using a 3,3"-diaminobenzidine (DAB) chromogen and [employer]AK[employer] Autostainer System. The staining intensity of the nucleus is scored  morphometrically using the Automated  Cellular Imaging System (ACIS) and is reported as the percentage of tumor cell nuclei demonstrating specific nuclear staining. Ki-67 (Mib-1), immunohistochemical stains are performed on formalin fixed, paraffin embedded tissue using a 3,3"-diaminobenzidine (DAB) chromogen and [employer]. The staining intensity of the nucleus is scored morphometrically using the Automated Cellular Imaging System (ACIS) and is reported as the percentage of tumor cell nuclei demonstrating specific nuclear staining. Estrogen receptor (SP1), immunohistochemical stains are performed on formalin fixed, paraffin embedded tissue using a 3,3"-diaminobenzidine (DAB) chromogen and DAKO Autostainer System. The staining intensity of the nucleus is scored morphometrically using the Automated Cellular Imaging System (ACIS) and is reported as the percentage of tumor cell nuclei demonstrating specific nuclear staining. Report signed out from the following location(s) Technical Component was performed at [facility]. [address],[place] [address].JASN:[id number],PFX:[id number]., Interpretation was performed at [place], [place], [place] [address]. CLIA #: [id number], 3 of 3 Amended copy   RADIOGRAPHY: Mm Digital Diagnostic Unilat L  01/21/2015   CLINICAL DATA:  Ultrasound-guided biopsy of a palpable suspicious mass in the upper inner quadrant of the left breast was performed today.  EXAM: DIAGNOSTIC LEFT MAMMOGRAM POST ULTRASOUND BIOPSY  COMPARISON:  Previous exam(s).  FINDINGS: Mammographic images were obtained following ultrasound guided biopsy of the spiculated palpable mass in the 10:30 position of the left breast, approximately 8-9 cm from the nipple. A ribbon shaped biopsy clip is satisfactorily positioned within the mass. Biopsy changes are noted adjacent to the mass.  IMPRESSION: Satisfactory position of ribbon shaped  biopsy clip.  Final Assessment: Post Procedure Mammograms for Marker Placement   Electronically Signed   By: Curlene Dolphin M.D.   On: 01/21/2015 16:39   US Breast Ltd Uni Left Inc Axilla  01/21/2015   CLINICAL DATA:  Palpable lump upper inner left breast in the 10:30 to 11 o'clock position.  EXAM: DIGITAL DIAGNOSTIC LEFT MAMMOGRAM WITH 3D TOMOSYNTHESIS WITH CAD  ULTRASOUND LEFT BREAST  COMPARISON:  Previous exam(s).  ACR Breast Density Category b: There are scattered areas of fibroglandular density.  FINDINGS: Metallic skin marker was placed site of patient concern in the upper inner left breast. Best seen in the CC projection and in the spot tangential view is a spiculated mass in the region of the palpable lump that measures approximately 10 mm on mammography. In the MLO projection, the anterior spiculations are visible, but the dominant portion of the mass is posterior to the imaging field.  Mammographic images were processed with CAD.  On physical exam, there is a palpable lump in the 10:30 position of the left breast approximately 8-9 cm from the nipple.  Targeted ultrasound is performed, showing an irregular hypoechoic mass with angular margins and posterior acoustic shadowing that measures 6 x 7 x 9 mm. No internal vascular flow is appreciated. This mass abuts the pectoralis muscle, but does not appear to invade it. Ultrasound of the left axilla shows normal appearing axillary lymph nodes.  IMPRESSION: 9 mm spiculated mass upper-inner quadrant left breast accounting for the palpable lump. This mass is suspicious for malignancy.  RECOMMENDATION: Ultrasound-guided biopsy is suggested. The patient can stay today for biopsy. Procedure was discussed in detail with the patient and an order was obtained for the biopsy, which will be dictated separately.  I have discussed the findings and recommendations with the patient. Results were also provided in writing at the conclusion of the visit. If applicable, a  reminder letter will be sent to the patient  regarding the next appointment.  BI-RADS CATEGORY  5: Highly suggestive of malignancy.   Electronically Signed   By: Curlene Dolphin M.D.   On: 01/21/2015 16:20   Mm Diag Breast Tomo Uni Left  01/21/2015   CLINICAL DATA:  Palpable lump upper inner left breast in the 10:30 to 11 o'clock position.  EXAM: DIGITAL DIAGNOSTIC LEFT MAMMOGRAM WITH 3D TOMOSYNTHESIS WITH CAD  ULTRASOUND LEFT BREAST  COMPARISON:  Previous exam(s).  ACR Breast Density Category b: There are scattered areas of fibroglandular density.  FINDINGS: Metallic skin marker was placed site of patient concern in the upper inner left breast. Best seen in the CC projection and in the spot tangential view is a spiculated mass in the region of the palpable lump that measures approximately 10 mm on mammography. In the MLO projection, the anterior spiculations are visible, but the dominant portion of the mass is posterior to the imaging field.  Mammographic images were processed with CAD.  On physical exam, there is a palpable lump in the 10:30 position of the left breast approximately 8-9 cm from the nipple.  Targeted ultrasound is performed, showing an irregular hypoechoic mass with angular margins and posterior acoustic shadowing that measures 6 x 7 x 9 mm. No internal vascular flow is appreciated. This mass abuts the pectoralis muscle, but does not appear to invade it. Ultrasound of the left axilla shows normal appearing axillary lymph nodes.  IMPRESSION: 9 mm spiculated mass upper-inner quadrant left breast accounting for the palpable lump. This mass is suspicious for malignancy.  RECOMMENDATION: Ultrasound-guided biopsy is suggested. The patient can stay today for biopsy. Procedure was discussed in detail with the patient and an order was obtained for the biopsy, which will be dictated separately.  I have discussed the findings and recommendations with the patient. Results were also provided in writing at the  conclusion of the visit. If applicable, a reminder letter will be sent to the patient regarding the next appointment.  BI-RADS CATEGORY  5: Highly suggestive of malignancy.   Electronically Signed   By: Curlene Dolphin M.D.   On: 01/21/2015 16:20   [place] Lt Breast Bx W Loc Dev 1st Lesion Img Bx Spec US Guide  01/23/2015   ADDENDUM REPORT: 01/22/2015 16:44  ADDENDUM: Pathology reveals Grade II invasive mammary (ductal) carcinoma of the Left breast. This was found to be concordant by Dr. Curlene Dolphin. Pathology results were discussed with the patient via telephone. The patient reported no problems with the biopsy site and is doing well. Post biopsy instructions were reviewed and questions were answered. The patient was encouraged to call [facility] with any additional questions and or concerns. The patient was referred to [facility] on January 29, 2015.  Pathology results reported by Terie Purser RN on January 22, 2015.   Electronically Signed   By: Curlene Dolphin M.D.   On: 01/22/2015 16:44   01/23/2015   CLINICAL DATA:  Palpable mass upper inner quadrant left breast. Mammogram and ultrasound characteristics of the mass are suspicious for malignancy.  EXAM: ULTRASOUND GUIDED LEFT BREAST CORE NEEDLE BIOPSY  COMPARISON:  Previous exam(s).  FINDINGS: I met with the patient and we discussed the procedure of ultrasound-guided biopsy, including benefits and alternatives. We discussed the high likelihood of a successful procedure. We discussed the risks of the procedure, including infection, bleeding, tissue injury, clip migration, and inadequate sampling. Informed written consent was given.  The usual time-out protocol was performed immediately prior to the procedure.  Using sterile technique and 2% Lidocaine as local anesthetic, under direct ultrasound visualization, a 14 gauge spring-loaded device was used to perform  biopsy of a palpable suspicious mass at 10:30 position approximately 9 cm from the nipple using a medial to lateral approach. At the conclusion of the procedure a tissue marker clip was deployed into the biopsy cavity. Follow up 2 view mammogram was performed and dictated separately.  IMPRESSION: Ultrasound guided biopsy of the left breast. No apparent complications.  Electronically Signed: By: Curlene Dolphin M.D. On: 01/21/2015 16:22      IMPRESSION: Clinical Stage IA invasive ductal carcinoma of the left breast. The patient would be a good candidate for lumpectomy and sentinel biopsy with radiation directed at the left breast. I discussed the treatment course side effects and potential toxicities of radiation therapy with the patient and her sons. The patient appears to understand and wishes to proceed with this treatment approach Patient will also have oncotype DX testing to see if she would benefit from adjuvant chemotherapy prior to radiation therapy. After she completes her radiation therapy she will proceed with adjuvant hormonal therapy.       This document serves as a record of services personally performed by Gery Pray, MD. It was created on his behalf by Arlyce Harman, a trained medical scribe. The creation of this record is based on the scribe's personal observations and the provider's statements to them. This document has been checked and approved by the attending provider.  ------------------------------------------------  Blair Promise, PhD, MD

## 2015-01-29 NOTE — Progress Notes (Signed)
[place]  [phone] Fax:([phone]     ID: Mlissa Tamayo DOB: [birth year]  MR#: [id number]  FXT#:[id number]  Patient Care Team: Boykin Nearing, MD as PCP - General (Family Medicine) Excell Seltzer, MD as Consulting Physician (General Surgery) Chauncey Cruel, MD as Consulting Physician (Oncology) Gery Pray, MD as Consulting Physician (Radiation Oncology) Mauro Kaufmann, RN as Registered Nurse Rockwell Germany, RN as Registered Nurse Sylvan Cheese, NP as Nurse Practitioner (Nurse Practitioner) PCP: Minerva Ends, MD OTHER MD:  CHIEF COMPLAINT: Estrogen receptor positive breast cancer  CURRENT TREATMENT: Awaiting definitive surgery   BREAST CANCER HISTORY: Maribell noted a change in her left breast sometime in June 2016. She had had bilateral screening mammography at [facility] 02/12/2014 with no suspicious findings. Eventually [name] brought her concern to medical attention and her primary care physician set her up for bilateral left diagnostic mammography with tomography and [facility] August 03/04/2015. The breast density was category B. In the region of a palpable mass (10:30 position left breast 9 cm from the nipple) there was a spiculated mass measuring approximately 10 mm on mammography. On ultrasound this was an irregular hypoechoic mass measuring 9 mm. There was no internal vascular flow. The mass abuts the pectoralis muscle but does not appear to invade it. Left axillary ultrasound showed normal appearing lymph nodes.  Biopsy of the left breast mass in question 01/21/2015 showed ([id number]) an invasive ductal carcinoma, E-cadherin positive, grade 2, estrogen receptor 100% positive, with strong staining intensity; progesterone receptor negative, with an MIB-1 of 20% and no HER-2 amplification, the signals ratio being 1.48 and the number per cell 2.00.  Her subsequent history is as  detailed below  INTERVAL HISTORY: [name] was evaluated in the multidisciplinary breast cancer clinic 01/29/2015 accompanied by her sons Delorise Shiner and [place].Her case was also presented in the multidisciplinary breast cancer conference that same morning. At that time a preliminary plan was proposed: Breast conserving surgery with sentinel lymph node sampling, Oncotype DX, then radiation treatments and hormone therapy  REVIEW OF SYSTEMS: Aside from the mass itself, there   There were no specific symptoms leading to the original mammogram, which was routinely scheduled. The patient denies unusual headaches, visual changes, nausea, vomiting, stiff neck, dizziness, or gait imbalance. There has been no cough, phlegm production, or pleurisy, no chest pain or pressure, and no change in bowel or bladder habits. The patient denies fever, rash, bleeding, unexplained fatigue or unexplained weight loss. A detailed review of systems was otherwise entirely negative.  PAST MEDICAL HISTORY: Past Medical History  Diagnosis Date  . Hypertension Dx 2008  . Breast cancer of upper-inner quadrant of left female breast 01/27/2015    PAST SURGICAL HISTORY: Past Surgical History  Procedure Laterality Date  . Tubal ligation  1970  . Abdominal hysterectomy  1980    uterine prolapse     FAMILY HISTORY Family History  Problem Relation Age of Onset  . Alzheimer's disease Father   . Cancer Mother     throat cancer- smoker and ETOH   . Heart disease Neg Hx   . Diabetes Neg Hx    the patient's father died at the age of 26 with Alzheimer's disease. The patient's mother died from what seemed to have been DTs at the age of 54. She had a history of throat cancer and EtOH. The patient was an only child. There is no other history of cancer in the family  to her knowledge   GYNECOLOGIC HISTORY:  No LMP recorded. Patient has had a hysterectomy.  menarche age 13, first live birth age 64. The patient is GX P4. She underwent  hysterectomy without salpingo-oophorectomy in 1980. She did not take hormone replacement.   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 This is a new job for her, only 4 months. She is very keen to continue working there and does not want her cancer treatment to interfere if at all possible. She is single, and lives by herself, with no pets. Her son [name] lives in [place] where he works as an Surveyor, minerals. Her son [name] is a Pharmacist, hospital working in [place]. Daughter [name] lives in [place] currently under unfortunate circumstances. The patient's fourth child a son, was born premature and died at 83 months from lung problems. [name] has 7 grandchildren and 3 great-grandchildren. She attends a local [employer]:  not in place    HEALTH MAINTENANCE: Social History  Substance Use Topics  . Smoking status: Never Smoker   . Smokeless tobacco: Never Used  . Alcohol Use: Yes     Comment: occas     Colonoscopy: October 2014/[facility]  PAP: status post hysterectomy   Bone density: never   Lipid panel:  No Known Allergies  Current Outpatient Prescriptions  Medication Sig Dispense Refill  . hydrochlorothiazide (HYDRODIURIL) 25 MG tablet Take 1 tablet (25 mg total) by mouth daily. 90 tablet 3   No current facility-administered medications for this visit.    OBJECTIVE: Middle-aged African-American woman who appears younger than stated age 64 Vitals:   01/29/15 1249  BP: 149/75  Pulse: 88  Temp: 97.9 F (36.6 C)  Resp: 18     Body mass index is 26.7 kg/(m^2).    ECOG FS:0 - Asymptomatic  Ocular: Sclerae unicteric, pupils equal, round and reactive to light Ear-nose-throat: Oropharynx clear and moist Lymphatic: No cervical or supraclavicular adenopathy Lungs no rales or rhonchi, good excursion bilaterally Heart regular rate and rhythm, no murmur appreciated Abd soft, nontender, positive bowel sounds MSK no  focal spinal tenderness, no joint edema Neuro: non-focal, well-oriented, appropriate affect Breasts: The right breast is unremarkable. The left breast is status post recent biopsy. The palpable masses in the superior aspect of the breast. It is firm, movable, and difficult to delineate. There is no associated erythema. There is no palpable adenopathy  LAB RESULTS:  CMP     Component Value Date/Time   NA 141 01/29/2015 1204   NA 140 04/16/2014 1141   K 3.4* 01/29/2015 1204   K 4.4 04/16/2014 1141   CL 104 04/16/2014 1141   CO2 27 01/29/2015 1204   CO2 28 04/16/2014 1141   GLUCOSE 159* 01/29/2015 1204   GLUCOSE 87 04/16/2014 1141   BUN 16.8 01/29/2015 1204   BUN 12 04/16/2014 1141   CREATININE 0.8 01/29/2015 1204   CREATININE 0.68 04/16/2014 1141   CREATININE 0.76 03/24/2010 2121   CALCIUM 9.5 01/29/2015 1204   CALCIUM 9.6 04/16/2014 1141   PROT 7.8 01/29/2015 1204   PROT 7.1 04/16/2014 1141   ALBUMIN 4.0 01/29/2015 1204   ALBUMIN 4.0 04/16/2014 1141   AST 17 01/29/2015 1204   AST 18 04/16/2014 1141   ALT 13 01/29/2015 1204   ALT 11 04/16/2014 1141   ALKPHOS 56 01/29/2015 1204   ALKPHOS 53 04/16/2014 1141   BILITOT 0.56 01/29/2015 1204   BILITOT 0.5 04/16/2014 1141  GFRNONAA >89 04/16/2014 1141   GFRNONAA 60* 03/06/2009 1800   GFRAA >89 04/16/2014 1141   GFRAA  03/06/2009 1800    >60        The eGFR has been calculated using the MDRD equation. This calculation has not been validated in all clinical situations. eGFR's persistently <60 mL/min signify possible Chronic Kidney Disease.    INo results found for: SPEP, UPEP  Lab Results  Component Value Date   WBC 6.8 01/29/2015   NEUTROABS 3.7 01/29/2015   HGB 13.8 01/29/2015   HCT 39.8 01/29/2015   MCV 82.2 01/29/2015   PLT 304 01/29/2015      Chemistry      Component Value Date/Time   NA 141 01/29/2015 1204   NA 140 04/16/2014 1141   K 3.4* 01/29/2015 1204   K 4.4 04/16/2014 1141   CL 104 04/16/2014  1141   CO2 27 01/29/2015 1204   CO2 28 04/16/2014 1141   BUN 16.8 01/29/2015 1204   BUN 12 04/16/2014 1141   CREATININE 0.8 01/29/2015 1204   CREATININE 0.68 04/16/2014 1141   CREATININE 0.76 03/24/2010 2121      Component Value Date/Time   CALCIUM 9.5 01/29/2015 1204   CALCIUM 9.6 04/16/2014 1141   ALKPHOS 56 01/29/2015 1204   ALKPHOS 53 04/16/2014 1141   AST 17 01/29/2015 1204   AST 18 04/16/2014 1141   ALT 13 01/29/2015 1204   ALT 11 04/16/2014 1141   BILITOT 0.56 01/29/2015 1204   BILITOT 0.5 04/16/2014 1141       No results found for: LABCA2  No components found for: LABCA125  No results for input(s): INR in the last 168 hours.  Urinalysis    Component Value Date/Time   COLORURINE YELLOW 03/06/2009 1804   APPEARANCEUR CLEAR 03/06/2009 1804   LABSPEC 1.026 03/06/2009 1804   PHURINE 5.5 03/06/2009 1804   GLUCOSEU NEGATIVE 03/06/2009 1804   HGBUR LARGE* 03/06/2009 1804   BILIRUBINUR SMALL* 03/06/2009 1804   KETONESUR 40* 03/06/2009 1804   PROTEINUR NEGATIVE 03/06/2009 1804   UROBILINOGEN 1.0 03/06/2009 1804   NITRITE NEGATIVE 03/06/2009 1804   LEUKOCYTESUR TRACE* 03/06/2009 1804    STUDIES: Mm Digital Diagnostic Unilat L  01/21/2015   CLINICAL DATA:  Ultrasound-guided biopsy of a palpable suspicious mass in the upper inner quadrant of the left breast was performed today.  EXAM: DIAGNOSTIC LEFT MAMMOGRAM POST ULTRASOUND BIOPSY  COMPARISON:  Previous exam(s).  FINDINGS: Mammographic images were obtained following ultrasound guided biopsy of the spiculated palpable mass in the 10:30 position of the left breast, approximately 8-9 cm from the nipple. A ribbon shaped biopsy clip is satisfactorily positioned within the mass. Biopsy changes are noted adjacent to the mass.  IMPRESSION: Satisfactory position of ribbon shaped biopsy clip.  Final Assessment: Post Procedure Mammograms for Marker Placement   Electronically Signed   By: Curlene Dolphin M.D.   On: 01/21/2015 16:39    US Breast Ltd Uni Left Inc Axilla  01/21/2015   CLINICAL DATA:  Palpable lump upper inner left breast in the 10:30 to 11 o'clock position.  EXAM: DIGITAL DIAGNOSTIC LEFT MAMMOGRAM WITH 3D TOMOSYNTHESIS WITH CAD  ULTRASOUND LEFT BREAST  COMPARISON:  Previous exam(s).  ACR Breast Density Category b: There are scattered areas of fibroglandular density.  FINDINGS: Metallic skin marker was placed site of patient concern in the upper inner left breast. Best seen in the CC projection and in the spot tangential view is a spiculated mass in the region of  the palpable lump that measures approximately 10 mm on mammography. In the MLO projection, the anterior spiculations are visible, but the dominant portion of the mass is posterior to the imaging field.  Mammographic images were processed with CAD.  On physical exam, there is a palpable lump in the 10:30 position of the left breast approximately 8-9 cm from the nipple.  Targeted ultrasound is performed, showing an irregular hypoechoic mass with angular margins and posterior acoustic shadowing that measures 6 x 7 x 9 mm. No internal vascular flow is appreciated. This mass abuts the pectoralis muscle, but does not appear to invade it. Ultrasound of the left axilla shows normal appearing axillary lymph nodes.  IMPRESSION: 9 mm spiculated mass upper-inner quadrant left breast accounting for the palpable lump. This mass is suspicious for malignancy.  RECOMMENDATION: Ultrasound-guided biopsy is suggested. The patient can stay today for biopsy. Procedure was discussed in detail with the patient and an order was obtained for the biopsy, which will be dictated separately.  I have discussed the findings and recommendations with the patient. Results were also provided in writing at the conclusion of the visit. If applicable, a reminder letter will be sent to the patient regarding the next appointment.  BI-RADS CATEGORY  5: Highly suggestive of malignancy.   Electronically Signed    By: Curlene Dolphin M.D.   On: 01/21/2015 16:20   Mm Diag Breast Tomo Uni Left  01/21/2015   CLINICAL DATA:  Palpable lump upper inner left breast in the 10:30 to 11 o'clock position.  EXAM: DIGITAL DIAGNOSTIC LEFT MAMMOGRAM WITH 3D TOMOSYNTHESIS WITH CAD  ULTRASOUND LEFT BREAST  COMPARISON:  Previous exam(s).  ACR Breast Density Category b: There are scattered areas of fibroglandular density.  FINDINGS: Metallic skin marker was placed site of patient concern in the upper inner left breast. Best seen in the CC projection and in the spot tangential view is a spiculated mass in the region of the palpable lump that measures approximately 10 mm on mammography. In the MLO projection, the anterior spiculations are visible, but the dominant portion of the mass is posterior to the imaging field.  Mammographic images were processed with CAD.  On physical exam, there is a palpable lump in the 10:30 position of the left breast approximately 8-9 cm from the nipple.  Targeted ultrasound is performed, showing an irregular hypoechoic mass with angular margins and posterior acoustic shadowing that measures 6 x 7 x 9 mm. No internal vascular flow is appreciated. This mass abuts the pectoralis muscle, but does not appear to invade it. Ultrasound of the left axilla shows normal appearing axillary lymph nodes.  IMPRESSION: 9 mm spiculated mass upper-inner quadrant left breast accounting for the palpable lump. This mass is suspicious for malignancy.  RECOMMENDATION: Ultrasound-guided biopsy is suggested. The patient can stay today for biopsy. Procedure was discussed in detail with the patient and an order was obtained for the biopsy, which will be dictated separately.  I have discussed the findings and recommendations with the patient. Results were also provided in writing at the conclusion of the visit. If applicable, a reminder letter will be sent to the patient regarding the next appointment.  BI-RADS CATEGORY  5: Highly suggestive  of malignancy.   Electronically Signed   By: Curlene Dolphin M.D.   On: 01/21/2015 16:20   [place] Lt Breast Bx W Loc Dev 1st Lesion Img Bx Spec US Guide  01/23/2015   ADDENDUM REPORT: 01/22/2015 16:44  ADDENDUM: Pathology reveals Grade II  invasive mammary (ductal) carcinoma of the Left breast. This was found to be concordant by Dr. Curlene Dolphin. Pathology results were discussed with the patient via telephone. The patient reported no problems with the biopsy site and is doing well. Post biopsy instructions were reviewed and questions were answered. The patient was encouraged to call [facility] with any additional questions and or concerns. The patient was referred to [facility] on January 29, 2015.  Pathology results reported by Terie Purser RN on January 22, 2015.   Electronically Signed   By: Curlene Dolphin M.D.   On: 01/22/2015 16:44   01/23/2015   CLINICAL DATA:  Palpable mass upper inner quadrant left breast. Mammogram and ultrasound characteristics of the mass are suspicious for malignancy.  EXAM: ULTRASOUND GUIDED LEFT BREAST CORE NEEDLE BIOPSY  COMPARISON:  Previous exam(s).  FINDINGS: I met with the patient and we discussed the procedure of ultrasound-guided biopsy, including benefits and alternatives. We discussed the high likelihood of a successful procedure. We discussed the risks of the procedure, including infection, bleeding, tissue injury, clip migration, and inadequate sampling. Informed written consent was given. The usual time-out protocol was performed immediately prior to the procedure.  Using sterile technique and 2% Lidocaine as local anesthetic, under direct ultrasound visualization, a 14 gauge spring-loaded device was used to perform biopsy of a palpable suspicious mass at 10:30 position approximately 9 cm from the nipple using a medial to lateral approach. At the conclusion of the procedure  a tissue marker clip was deployed into the biopsy cavity. Follow up 2 view mammogram was performed and dictated separately.  IMPRESSION: Ultrasound guided biopsy of the left breast. No apparent complications.  Electronically Signed: By: Curlene Dolphin M.D. On: 01/21/2015 16:22    ASSESSMENT: 64 y.o. [place] woman status post left breast biopsy 01/21/2015 for a clinical T1b N0, stage IA invasive ductal carcinoma, grade 1 or 2, strongly estrogen receptor positive, progesterone receptor negative, with no HER-2 amplification and an MIB-1 of 20%  (1) breast conserving surgery with sentinel lymph node sampling pending  (2) Oncotype to clarify the chemotherapy decision  (3) adjuvant radiation to follow surgery  (4) anti-estrogens for 5-10 years to follow radiation  PLAN: We spent the better part of today's hour-long appointment discussing the biology of breast cancer in general, and the specifics of the patient's tumor in particular. Tuleen has a good understanding of the difference between local and systemic treatments. She is concerned about radiation but was reassured that only the breast received radiation and not any other part of the body.  With regards to systemic treatment she knows she will be a very good candidate for antiestrogen therapy and not at all a candidate for anti-HER-2 treatments. The chemotherapy question is more problematic. The benefit of chemotherapy is likely to be marginal in this stage I node-negative estrogen receptor positive tumor.  For that reason we are going to request an Oncotype DX test to be sent from the definitive surgical sample. We discussed the possible results and I am hopeful the tumor score will fall in the "low risk" category so she can avoid chemotherapy.  Nkechi is very interested in nutrition and I strongly recommended fresh as opposed to processed foods, emphasize vegetables, Iven Finn is fine, and cut back on that "Whites". She has difficulties with dairy  anyway because of lactose intolerance I also recommended exercise (but note that her job is physically  very active).  The patient has a good understanding of the overall plan. She agrees with it. She knows the goal of treatment in her case is cure. She will call with any problems that may develop before her next visit with me, which will be 2-3 weeks after her definitive surgery.Marland Kitchen  Chauncey Cruel, MD   01/29/2015 6:19 PM Medical Oncology and Hematology [facility] [address] [place], [place] [address] Tel. [phone]    Fax. [phone]

## 2015-01-29 NOTE — Patient Instructions (Signed)

## 2015-01-31 ENCOUNTER — Other Ambulatory Visit: Payer: Self-pay | Admitting: General Surgery

## 2015-01-31 DIAGNOSIS — C50912 Malignant neoplasm of unspecified site of left female breast: Secondary | ICD-10-CM

## 2015-02-03 ENCOUNTER — Telehealth: Payer: Self-pay | Admitting: *Deleted

## 2015-02-03 NOTE — Telephone Encounter (Signed)
Left message for a return phone call from [facility] 8/17.  Awaiting patient response.

## 2015-02-03 NOTE — Telephone Encounter (Signed)
-----   Message from Rockwell Germany, RN sent at 02/03/2015  3:11 PM EDT ----- Regarding: Care Plan Patient was seen in [facility] 8/17.  She is scheduled for the following.  Surgery - Lumpectomy with SLN - 8/25 Dr. Jana Hakim - 9/27  An oncotype will be sent when final pathology results are ready.  Please let me know if you have any questions.  Thanks,  Varney Biles

## 2015-02-05 ENCOUNTER — Encounter (HOSPITAL_BASED_OUTPATIENT_CLINIC_OR_DEPARTMENT_OTHER): Payer: Self-pay | Admitting: *Deleted

## 2015-02-05 ENCOUNTER — Encounter (HOSPITAL_BASED_OUTPATIENT_CLINIC_OR_DEPARTMENT_OTHER)
Admission: RE | Admit: 2015-02-05 | Discharge: 2015-02-05 | Disposition: A | Discharge status: Home / Self Care | Payer: 59 | Source: Ambulatory Visit | Attending: General Surgery | Admitting: General Surgery

## 2015-02-05 ENCOUNTER — Ambulatory Visit
Admission: RE | Admit: 2015-02-05 | Discharge: 2015-02-05 | Disposition: A | Discharge status: Home / Self Care | Payer: 59 | Source: Ambulatory Visit | Attending: General Surgery | Admitting: General Surgery

## 2015-02-05 DIAGNOSIS — C50912 Malignant neoplasm of unspecified site of left female breast: Secondary | ICD-10-CM

## 2015-02-05 DIAGNOSIS — Z170 Estrogen receptor positive status [ER+]: Secondary | ICD-10-CM | POA: Diagnosis not present

## 2015-02-05 DIAGNOSIS — I10 Essential (primary) hypertension: Secondary | ICD-10-CM | POA: Diagnosis not present

## 2015-02-05 DIAGNOSIS — Z811 Family history of alcohol abuse and dependence: Secondary | ICD-10-CM | POA: Diagnosis not present

## 2015-02-05 DIAGNOSIS — F1010 Alcohol abuse, uncomplicated: Secondary | ICD-10-CM | POA: Diagnosis not present

## 2015-02-05 DIAGNOSIS — Z9889 Other specified postprocedural states: Secondary | ICD-10-CM | POA: Diagnosis not present

## 2015-02-05 DIAGNOSIS — Z90711 Acquired absence of uterus with remaining cervical stump: Secondary | ICD-10-CM | POA: Diagnosis not present

## 2015-02-05 NOTE — Progress Notes (Signed)
Dr. Orene Desanctis reviewed EKG and ok for surgery

## 2015-02-05 NOTE — H&P (Signed)
History of Present Illness Marissa Williams T. Kalan Yeley MD; 01/29/2015 2:11 PM) The patient is a 64 year old female who presents with breast cancer. She is a postmenopausal female referred by Dr. Curlene Dolphin for evaluation of recently diagnosed carcinoma of the left breast. She recently noticed a lump in the upper inner left breast. she saw her primary physician and was referred to the breast center. Subsequent imaging included diagnostic mamogram showing aa spiculated mass in the upper inner left breast measuring approximately 10 mm and ultrasound showing aan irregular hypoechoic mass measuring 6 x 7 x 9 millimeters.. An ultrasound guided breast biopsy was performed on 01/21/2015 with pathology revealing invasive ductal carcinoma of the breast. She is seen now in breast multidisciplinary clinic for initial treatment planning. She has experienced no other symptoms, specifically nipple discharge, skin change or pain. She does not have a personal history of any previous breast problems.  Findings at that time were the following: Tumor size: 0.9 cm Tumor grade: 1-2 Estrogen Receptor: positive Progesterone Receptor: negative Her-2 neu: negative Lymph node status: negative    Other Problems Patsey Berthold, CMA; 01/29/2015 10:25 AM) Alcohol Abuse Breast Cancer High blood pressure Lump In Breast  Past Surgical History Patsey Berthold, [place]; 01/29/2015 10:25 AM) Breast Biopsy Left. Hysterectomy (not due to cancer) - Partial Tonsillectomy  Diagnostic Studies History Patsey Berthold, [place]; 01/29/2015 10:25 AM) Colonoscopy 1-5 years ago Mammogram within last year Pap Smear >5 years ago  Medication History Patsey Berthold, [place]; 01/29/2015 10:25 AM) No Current Medications Medications Reconciled  Social History Patsey Berthold, CMA; 01/29/2015 10:25 AM) Alcohol use Occasional alcohol use. Caffeine use Coffee. No drug use Tobacco use Never smoker.  Family History Patsey Berthold, [place];  01/29/2015 10:25 AM) Alcohol Abuse Father, Mother.  Pregnancy / Birth History Patsey Berthold, [place]; 01/29/2015 10:25 AM) Age at menarche 57 years. Age of menopause 4-50 Gravida 4 Irregular periods Maternal age 75-20 Para 4  Review of Systems Patsey Berthold CMA; 01/29/2015 10:25 AM) General Not Present- Appetite Loss, Chills, Fatigue, Fever, Night Sweats, Weight Gain and Weight Loss. Skin Not Present- Change in Wart/Mole, Dryness, Hives, Jaundice, New Lesions, Non-Healing Wounds, Rash and Ulcer. HEENT Present- Wears glasses/contact lenses. Not Present- Earache, Hearing Loss, Hoarseness, Nose Bleed, Oral Ulcers, Ringing in the Ears, Seasonal Allergies, Sinus Pain, Sore Throat, Visual Disturbances and Yellow Eyes. Respiratory Not Present- Bloody sputum, Chronic Cough, Difficulty Breathing, Snoring and Wheezing. Breast Present- Breast Mass. Not Present- Breast Pain, Nipple Discharge and Skin Changes. Cardiovascular Not Present- Chest Pain, Difficulty Breathing Lying Down, Leg Cramps, Palpitations, Rapid Heart Rate, Shortness of Breath and Swelling of Extremities. Gastrointestinal Not Present- Abdominal Pain, Bloating, Bloody Stool, Change in Bowel Habits, Chronic diarrhea, Constipation, Difficulty Swallowing, Excessive gas, Gets full quickly at meals, Hemorrhoids, Indigestion, Nausea, Rectal Pain and Vomiting. Musculoskeletal Not Present- Back Pain, Joint Pain, Joint Stiffness, Muscle Pain, Muscle Weakness and Swelling of Extremities. Neurological Not Present- Decreased Memory, Fainting, Headaches, Numbness, Seizures, Tingling, Tremor, Trouble walking and Weakness. Psychiatric Not Present- Anxiety, Bipolar, Change in Sleep Pattern, Depression, Fearful and Frequent crying. Endocrine Present- Hot flashes. Not Present- Cold Intolerance, Excessive Hunger, Hair Changes, Heat Intolerance and New Diabetes. Hematology Not Present- Easy Bruising, Excessive bleeding, Gland problems, HIV and Persistent  Infections.   Physical Exam Marissa Williams T. Ayoub Arey MD; 01/29/2015 2:12 PM) The physical exam findings are as follows: Note:General: Alert, well-developed and well nourished African-American female, in no distress Skin: Warm and dry without rash or infection. HEENT: No palpable masses or thyromegaly. Sclera nonicteric.  Pupils equal round and reactive. Breasts: near the extreme upper inner left breast is an approximately 1 cm indistinct mass with some swelling post biopsy Lymph nodes: No cervical, supraclavicular, or axillary nodes palpable. Lungs: Breath sounds clear and equal. No wheezing or increased work of breathing. Cardiovascular: Regular rate and rhythm without murmer. No JVD or edema. Peripheral pulses intact. No carotid bruits. Abdomen: Nondistended. Soft and nontender. No masses palpable. No organomegaly. No palpable hernias. Extremities: No edema or joint swelling or deformity. No chronic venous stasis changes. Neurologic: Alert and fully oriented. Gait normal. Psychiatric: Normal mood and affect. Thought content appropriate with normal judgement and insight    Assessment & Plan Marissa Williams T. Levana Minetti MD; 01/29/2015 2:21 PM) PRIMARY CANCER OF UPPER INNER QUADRANT OF LEFT BREAST (174.2  C50.212) Impression: 64 year old female with a new diagnosis of cancer of the left breast, pper inner quadrant. Clinical stage 1 a, ER positive, PR negative, HER-2 negative. I discussed with the patient and family members present today initial surgical treatment options. We discussed options of breast conservation with lumpectomy or total mastectomy and sentinal lymph node biopsy/dissection. she would apion with lumpectomy candidate for breast conservation with lumpectomy and axillary sentinel lymph node biopsy. After discussion they have elected to proceed with left breast lumpectomy with axillary sentinel lymph node biopsy. We discussed the indications and nature of the procedure, and expected recovery,  in detail. Surgical risks including anesthetic complications, cardiorespiratory complications, bleeding, infection, wound healing complications, blood clots, lymphedema, local and distant recurrence and possible need for further surgery based on the final pathology was discussed and understood. Chemotherapy, hormonal therapy and radiation therapy have been discussed. They have been provided with literature regarding the treatment of breast cancer. All questions were answered. They understand and agree to proceed and we will go ahead with scheduling. Current Plans  Pt Education - [facility] Breast Biopsy HCI: discussed with patient and provided information. radioactive seed localized left breast liopsytomy with left axillary sentinel lymph node biopsy

## 2015-02-06 ENCOUNTER — Encounter (HOSPITAL_BASED_OUTPATIENT_CLINIC_OR_DEPARTMENT_OTHER): Payer: Self-pay | Admitting: *Deleted

## 2015-02-06 ENCOUNTER — Encounter (HOSPITAL_BASED_OUTPATIENT_CLINIC_OR_DEPARTMENT_OTHER)
Admission: RE | Disposition: A | Discharge status: Home / Self Care | Payer: Self-pay | Source: Ambulatory Visit | Attending: General Surgery

## 2015-02-06 ENCOUNTER — Ambulatory Visit (HOSPITAL_BASED_OUTPATIENT_CLINIC_OR_DEPARTMENT_OTHER)
Admission: RE | Admit: 2015-02-06 | Discharge: 2015-02-06 | Disposition: A | Discharge status: Home / Self Care | Payer: 59 | Source: Ambulatory Visit | Attending: General Surgery | Admitting: General Surgery

## 2015-02-06 ENCOUNTER — Ambulatory Visit
Admission: RE | Admit: 2015-02-06 | Discharge: 2015-02-06 | Disposition: A | Discharge status: Home / Self Care | Payer: 59 | Source: Ambulatory Visit | Attending: General Surgery | Admitting: General Surgery

## 2015-02-06 ENCOUNTER — Ambulatory Visit (HOSPITAL_BASED_OUTPATIENT_CLINIC_OR_DEPARTMENT_OTHER): Payer: 59 | Admitting: Anesthesiology

## 2015-02-06 ENCOUNTER — Ambulatory Visit (HOSPITAL_COMMUNITY)
Admission: RE | Admit: 2015-02-06 | Discharge: 2015-02-06 | Disposition: A | Discharge status: Home / Self Care | Payer: 59 | Source: Ambulatory Visit | Attending: General Surgery | Admitting: General Surgery

## 2015-02-06 DIAGNOSIS — Z170 Estrogen receptor positive status [ER+]: Secondary | ICD-10-CM | POA: Insufficient documentation

## 2015-02-06 DIAGNOSIS — C50912 Malignant neoplasm of unspecified site of left female breast: Secondary | ICD-10-CM

## 2015-02-06 DIAGNOSIS — C50212 Malignant neoplasm of upper-inner quadrant of left female breast: Secondary | ICD-10-CM

## 2015-02-06 DIAGNOSIS — F1010 Alcohol abuse, uncomplicated: Secondary | ICD-10-CM | POA: Insufficient documentation

## 2015-02-06 DIAGNOSIS — Z9889 Other specified postprocedural states: Secondary | ICD-10-CM | POA: Insufficient documentation

## 2015-02-06 DIAGNOSIS — Z811 Family history of alcohol abuse and dependence: Secondary | ICD-10-CM | POA: Insufficient documentation

## 2015-02-06 DIAGNOSIS — I10 Essential (primary) hypertension: Secondary | ICD-10-CM | POA: Insufficient documentation

## 2015-02-06 DIAGNOSIS — Z90711 Acquired absence of uterus with remaining cervical stump: Secondary | ICD-10-CM | POA: Insufficient documentation

## 2015-02-06 HISTORY — PX: BREAST LUMPECTOMY WITH RADIOACTIVE SEED AND SENTINEL LYMPH NODE BIOPSY: SHX6550

## 2015-02-06 SURGERY — BREAST LUMPECTOMY WITH RADIOACTIVE SEED AND SENTINEL LYMPH NODE BIOPSY
Anesthesia: Regional | Site: Breast | Laterality: Left

## 2015-02-06 MED ORDER — HYDROMORPHONE HCL 1 MG/ML IJ SOLN
0.2500 mg | INTRAMUSCULAR | Status: DC | PRN
  Administered 2015-02-06: 0.25 mg via INTRAVENOUS
  Administered 2015-02-06: 0.5 mg via INTRAVENOUS
  Administered 2015-02-06: 0.25 mg via INTRAVENOUS

## 2015-02-06 MED ORDER — CEFAZOLIN SODIUM-DEXTROSE 2-3 GM-% IV SOLR
INTRAVENOUS | Status: AC
  Filled 2015-02-06: qty 50

## 2015-02-06 MED ORDER — BUPIVACAINE-EPINEPHRINE (PF) 0.5% -1:200000 IJ SOLN
INTRAMUSCULAR | Status: AC
  Filled 2015-02-06: qty 30

## 2015-02-06 MED ORDER — MIDAZOLAM HCL 2 MG/2ML IJ SOLN
INTRAMUSCULAR | Status: AC
  Filled 2015-02-06: qty 2

## 2015-02-06 MED ORDER — CHLORHEXIDINE GLUCONATE 4 % EX LIQD: 1.0000 "application " | Freq: Once | CUTANEOUS | Status: DC

## 2015-02-06 MED ORDER — SUCCINYLCHOLINE CHLORIDE 20 MG/ML IJ SOLN
INTRAMUSCULAR | Status: AC
  Filled 2015-02-06: qty 1

## 2015-02-06 MED ORDER — SCOPOLAMINE 1 MG/3DAYS TD PT72: 1.0000 | MEDICATED_PATCH | Freq: Once | TRANSDERMAL | Status: DC | PRN

## 2015-02-06 MED ORDER — SODIUM CHLORIDE 0.9 % IJ SOLN
INTRAMUSCULAR | Status: DC | PRN
  Administered 2015-02-06: 5 mL via INTRAMUSCULAR

## 2015-02-06 MED ORDER — HYDROCODONE-ACETAMINOPHEN 5-325 MG PO TABS: 1.0000 | ORAL_TABLET | ORAL | Status: DC | PRN

## 2015-02-06 MED ORDER — BUPIVACAINE-EPINEPHRINE (PF) 0.5% -1:200000 IJ SOLN
INTRAMUSCULAR | Status: DC | PRN
  Administered 2015-02-06: 5 mL

## 2015-02-06 MED ORDER — TECHNETIUM TC 99M SULFUR COLLOID FILTERED
1.0000 | Freq: Once | INTRAVENOUS | Status: AC | PRN
  Administered 2015-02-06: 1 via INTRADERMAL

## 2015-02-06 MED ORDER — CEFAZOLIN SODIUM-DEXTROSE 2-3 GM-% IV SOLR
2.0000 g | INTRAVENOUS | Status: AC
  Administered 2015-02-06: 2 g via INTRAVENOUS

## 2015-02-06 MED ORDER — FENTANYL CITRATE (PF) 100 MCG/2ML IJ SOLN
INTRAMUSCULAR | Status: AC
  Filled 2015-02-06: qty 2

## 2015-02-06 MED ORDER — SODIUM CHLORIDE 0.9 % IJ SOLN
INTRAMUSCULAR | Status: AC
  Filled 2015-02-06: qty 10

## 2015-02-06 MED ORDER — GLYCOPYRROLATE 0.2 MG/ML IJ SOLN: 0.2000 mg | Freq: Once | INTRAMUSCULAR | Status: DC | PRN

## 2015-02-06 MED ORDER — FENTANYL CITRATE (PF) 100 MCG/2ML IJ SOLN
50.0000 ug | INTRAMUSCULAR | Status: DC | PRN
  Administered 2015-02-06: 100 ug via INTRAVENOUS
  Administered 2015-02-06: 50 ug via INTRAVENOUS

## 2015-02-06 MED ORDER — BUPIVACAINE-EPINEPHRINE (PF) 0.5% -1:200000 IJ SOLN
INTRAMUSCULAR | Status: DC | PRN
  Administered 2015-02-06: 30 mL

## 2015-02-06 MED ORDER — DEXAMETHASONE SODIUM PHOSPHATE 4 MG/ML IJ SOLN
INTRAMUSCULAR | Status: DC | PRN
  Administered 2015-02-06: 10 mg via INTRAVENOUS

## 2015-02-06 MED ORDER — PROMETHAZINE HCL 25 MG/ML IJ SOLN: 6.2500 mg | INTRAMUSCULAR | Status: DC | PRN

## 2015-02-06 MED ORDER — HYDROMORPHONE HCL 1 MG/ML IJ SOLN
INTRAMUSCULAR | Status: AC
  Filled 2015-02-06: qty 1

## 2015-02-06 MED ORDER — LACTATED RINGERS IV SOLN
INTRAVENOUS | Status: DC
  Administered 2015-02-06 (×2): via INTRAVENOUS

## 2015-02-06 MED ORDER — SCOPOLAMINE 1 MG/3DAYS TD PT72: 1.0000 | MEDICATED_PATCH | TRANSDERMAL | Status: DC

## 2015-02-06 MED ORDER — PROPOFOL 500 MG/50ML IV EMUL
INTRAVENOUS | Status: AC
  Filled 2015-02-06: qty 50

## 2015-02-06 MED ORDER — PROPOFOL 10 MG/ML IV BOLUS
INTRAVENOUS | Status: DC | PRN
  Administered 2015-02-06: 200 mg via INTRAVENOUS

## 2015-02-06 MED ORDER — METHYLENE BLUE 1 % INJ SOLN
INTRAMUSCULAR | Status: AC
  Filled 2015-02-06: qty 10

## 2015-02-06 MED ORDER — MIDAZOLAM HCL 2 MG/2ML IJ SOLN
1.0000 mg | INTRAMUSCULAR | Status: DC | PRN
  Administered 2015-02-06: 2 mg via INTRAVENOUS

## 2015-02-06 MED ORDER — FENTANYL CITRATE (PF) 100 MCG/2ML IJ SOLN
INTRAMUSCULAR | Status: AC
  Filled 2015-02-06: qty 4

## 2015-02-06 MED ORDER — ONDANSETRON HCL 4 MG/2ML IJ SOLN
INTRAMUSCULAR | Status: DC | PRN
  Administered 2015-02-06: 4 mg via INTRAVENOUS

## 2015-02-06 SURGICAL SUPPLY — 48 items
APPLIER CLIP 9.375 MED OPEN (MISCELLANEOUS) ×2
BINDER BREAST LRG (GAUZE/BANDAGES/DRESSINGS) IMPLANT
BINDER BREAST MEDIUM (GAUZE/BANDAGES/DRESSINGS) IMPLANT
BINDER BREAST XLRG (GAUZE/BANDAGES/DRESSINGS) ×2 IMPLANT
BINDER BREAST XXLRG (GAUZE/BANDAGES/DRESSINGS) IMPLANT
BLADE SURG 15 STRL LF DISP TIS (BLADE) ×1 IMPLANT
BLADE SURG 15 STRL SS (BLADE) ×1
CANISTER SUC SOCK COL 7IN (MISCELLANEOUS) IMPLANT
CANISTER SUCT 1200ML W/VALVE (MISCELLANEOUS) ×2 IMPLANT
CHLORAPREP W/TINT 26ML (MISCELLANEOUS) ×2 IMPLANT
CLIP APPLIE 9.375 MED OPEN (MISCELLANEOUS) ×1 IMPLANT
COVER BACK TABLE 60X90IN (DRAPES) ×2 IMPLANT
COVER MAYO STAND STRL (DRAPES) ×2 IMPLANT
COVER PROBE W GEL 5X96 (DRAPES) ×2 IMPLANT
DECANTER SPIKE VIAL GLASS SM (MISCELLANEOUS) IMPLANT
DEVICE DUBIN W/COMP PLATE 8390 (MISCELLANEOUS) ×2 IMPLANT
DRAPE LAPAROSCOPIC ABDOMINAL (DRAPES) ×2 IMPLANT
DRAPE UTILITY XL STRL (DRAPES) ×2 IMPLANT
ELECT COATED BLADE 2.86 ST (ELECTRODE) ×2 IMPLANT
ELECT REM PT RETURN 9FT ADLT (ELECTROSURGICAL) ×2
ELECTRODE REM PT RTRN 9FT ADLT (ELECTROSURGICAL) ×1 IMPLANT
GLOVE BIOGEL PI IND STRL 7.0 (GLOVE) ×2 IMPLANT
GLOVE BIOGEL PI IND STRL 8 (GLOVE) ×1 IMPLANT
GLOVE BIOGEL PI INDICATOR 7.0 (GLOVE) ×2
GLOVE BIOGEL PI INDICATOR 8 (GLOVE) ×1
GLOVE ECLIPSE 6.5 STRL STRAW (GLOVE) ×2 IMPLANT
GLOVE ECLIPSE 7.5 STRL STRAW (GLOVE) ×2 IMPLANT
GOWN STRL REUS W/ TWL LRG LVL3 (GOWN DISPOSABLE) ×1 IMPLANT
GOWN STRL REUS W/ TWL XL LVL3 (GOWN DISPOSABLE) ×1 IMPLANT
GOWN STRL REUS W/TWL LRG LVL3 (GOWN DISPOSABLE) ×1
GOWN STRL REUS W/TWL XL LVL3 (GOWN DISPOSABLE) ×1
KIT MARKER MARGIN INK (KITS) ×2 IMPLANT
LIQUID BAND (GAUZE/BANDAGES/DRESSINGS) ×2 IMPLANT
NDL SAFETY ECLIPSE 18X1.5 (NEEDLE) ×1 IMPLANT
NEEDLE HYPO 18GX1.5 SHARP (NEEDLE) ×1
NEEDLE HYPO 25X1 1.5 SAFETY (NEEDLE) ×4 IMPLANT
NS IRRIG 1000ML POUR BTL (IV SOLUTION) ×2 IMPLANT
PACK BASIN DAY SURGERY FS (CUSTOM PROCEDURE TRAY) ×2 IMPLANT
PENCIL BUTTON HOLSTER BLD 10FT (ELECTRODE) ×2 IMPLANT
SLEEVE SCD COMPRESS KNEE MED (MISCELLANEOUS) ×2 IMPLANT
SPONGE LAP 4X18 X RAY DECT (DISPOSABLE) ×2 IMPLANT
SUT MON AB 5-0 PS2 18 (SUTURE) ×2 IMPLANT
SUT VICRYL 3-0 CR8 SH (SUTURE) ×2 IMPLANT
SYR CONTROL 10ML LL (SYRINGE) ×4 IMPLANT
TOWEL OR 17X24 6PK STRL BLUE (TOWEL DISPOSABLE) ×2 IMPLANT
TOWEL OR NON WOVEN STRL DISP B (DISPOSABLE) ×2 IMPLANT
TUBE CONNECTING 20X1/4 (TUBING) ×2 IMPLANT
YANKAUER SUCT BULB TIP NO VENT (SUCTIONS) ×2 IMPLANT

## 2015-02-06 NOTE — Progress Notes (Signed)
Assisted Dr. Singer with left, ultrasound guided, pectoralis block. Side rails up, monitors on throughout procedure. See vital signs in flow sheet. Tolerated Procedure well. °

## 2015-02-06 NOTE — Discharge Instructions (Signed)
[facility] °Office Phone Number [phone] ° °BREAST BIOPSY/ PARTIAL MASTECTOMY: POST OP INSTRUCTIONS ° °Always review your discharge instruction sheet given to you by the facility where your surgery was performed. ° °IF YOU HAVE DISABILITY OR FAMILY LEAVE FORMS, YOU MUST BRING THEM TO THE OFFICE FOR PROCESSING.  DO NOT GIVE THEM TO YOUR DOCTOR. ° °1. A prescription for pain medication may be given to you upon discharge.  Take your pain medication as prescribed, if needed.  If narcotic pain medicine is not needed, then you may take acetaminophen (Tylenol) or ibuprofen (Advil) as needed. °2. Take your usually prescribed medications unless otherwise directed °3. If you need a refill on your pain medication, please contact your pharmacy.  They will contact our office to request authorization.  Prescriptions will not be filled after 5pm or on week-ends. °4. You should eat very light the first 24 hours after surgery, such as soup, crackers, pudding, etc.  Resume your normal diet the day after surgery. °5. Most patients will experience some swelling and bruising in the breast.  Ice packs and a good support bra will help.  Swelling and bruising can take several days to resolve.  °6. It is common to experience some constipation if taking pain medication after surgery.  Increasing fluid intake and taking a stool softener will usually help or prevent this problem from occurring.  A mild laxative (Milk of Magnesia or Miralax) should be taken according to package directions if there are no bowel movements after 48 hours. °7. Unless discharge instructions indicate otherwise, you may remove your bandages 24-48 hours after surgery, and you may shower at that time.  You may have steri-strips (small skin tapes) in place directly over the incision.  These strips should be left on the skin for 7-10 days.  If your surgeon used skin glue on the incision, you may shower in 24 hours.  The glue will flake off over the  next 2-3 weeks.  Any sutures or staples will be removed at the office during your follow-up visit. °8. ACTIVITIES:  You may resume regular daily activities (gradually increasing) beginning the next day.  Wearing a good support bra or sports bra minimizes pain and swelling.  You may have sexual intercourse when it is comfortable. °a. You may drive when you no longer are taking prescription pain medication, you can comfortably wear a seatbelt, and you can safely maneuver your car and apply brakes. °b. RETURN TO WORK:  ______________________________________________________________________________________ °9. You should see your doctor in the office for a follow-up appointment approximately two weeks after your surgery.  Your doctor’s nurse will typically make your follow-up appointment when she calls you with your pathology report.  Expect your pathology report 2-3 business days after your surgery.  You may call to check if you do not hear from us after three days. °10. OTHER INSTRUCTIONS: _______________________________________________________________________________________________ _____________________________________________________________________________________________________________________________________ °_____________________________________________________________________________________________________________________________________ °_____________________________________________________________________________________________________________________________________ ° °WHEN TO CALL YOUR DOCTOR: °1. Fever over 101.0 °2. Nausea and/or vomiting. °3. Extreme swelling or bruising. °4. Continued bleeding from incision. °5. Increased pain, redness, or drainage from the incision. ° °The clinic staff is available to answer your questions during regular business hours.  Please don’t hesitate to call and ask to speak to one of the nurses for clinical concerns.  If you have a medical emergency, go to the nearest  emergency room or call 911.  A surgeon from [facility] is always on call at the hospital. ° °For further questions, please visit [email or web address]  ° ° ° °  Post Anesthesia Home Care Instructions ° °Activity: °Get plenty of rest for the remainder of the day. A responsible adult should stay with you for 24 hours following the procedure.  °For the next 24 hours, DO NOT: °-Drive a car °-Operate machinery °-Drink alcoholic beverages °-Take any medication unless instructed by your physician °-Make any legal decisions or sign important papers. ° °Meals: °Start with liquid foods such as gelatin or soup. Progress to regular foods as tolerated. Avoid greasy, spicy, heavy foods. If nausea and/or vomiting occur, drink only clear liquids until the nausea and/or vomiting subsides. Call your physician if vomiting continues. ° °Special Instructions/Symptoms: °Your throat may feel dry or sore from the anesthesia or the breathing tube placed in your throat during surgery. If this causes discomfort, gargle with warm salt water. The discomfort should disappear within 24 hours. ° °If you had a scopolamine patch placed behind your ear for the management of post- operative nausea and/or vomiting: ° °1. The medication in the patch is effective for 72 hours, after which it should be removed.  Wrap patch in a tissue and discard in the trash. Wash hands thoroughly with soap and water. °2. You may remove the patch earlier than 72 hours if you experience unpleasant side effects which may include dry mouth, dizziness or visual disturbances. °3. Avoid touching the patch. Wash your hands with soap and water after contact with the patch. °  ° °

## 2015-02-06 NOTE — Interval H&P Note (Signed)
History and Physical Interval Note:  02/06/2015 7:33 AM  Marissa Williams  has presented today for surgery, with the diagnosis of left breast cancer  The various methods of treatment have been discussed with the patient and family. After consideration of risks, benefits and other options for treatment, the patient has consented to  Procedure(s): LEFT BREAST LUMPECTOMY WITH RADIOACTIVE SEED AND LEFT AXILLARY SENTINEL LYMPH NODE BIOPSY (Left) as a surgical intervention .  The patient's history has been reviewed, patient examined, no change in status, stable for surgery.  I have reviewed the patient's chart and labs.  Questions were answered to the patient's satisfaction.     Jaquae Rieves T

## 2015-02-06 NOTE — Transfer of Care (Signed)
Immediate Anesthesia Transfer of Care Note  Patient: Marissa Williams  Procedure(s) Performed: Procedure(s): LEFT BREAST LUMPECTOMY WITH RADIOACTIVE SEED AND LEFT AXILLARY SENTINEL LYMPH NODE BIOPSY (Left)  Patient Location: PACU  Anesthesia Type:GA combined with regional for post-op pain  Level of Consciousness: awake, alert  and patient cooperative  Airway & Oxygen Therapy: Patient Spontanous Breathing and Patient connected to face mask oxygen  Post-op Assessment: Report given to RN, Post -op Vital signs reviewed and stable and Patient moving all extremities  Post vital signs: Reviewed and stable  Last Vitals:  Filed Vitals:   02/06/15 0717  BP:   Pulse: 89  Temp:   Resp: 18    Complications: No apparent anesthesia complications

## 2015-02-06 NOTE — Anesthesia Procedure Notes (Addendum)
Anesthesia Regional Block:  Pectoralis block  Pre-Anesthetic Checklist: ,, timeout performed, Correct Patient, Correct Site, Correct Laterality, Correct Procedure, Correct Position, site marked, Risks and benefits discussed,  Surgical consent,  Pre-op evaluation,  At surgeon's request and post-op pain management  Laterality: Left  Prep: chloraprep       Needles:  Injection technique: Single-shot  Needle Type: Echogenic Stimulator Needle     Needle Length: 10cm 10 cm Needle Gauge: 21 and 21 G    Additional Needles:  Procedures: ultrasound guided (picture in chart) Pectoralis block Narrative:  Start time: 02/06/2015 7:01 AM End time: 02/06/2015 7:11 AM Injection made incrementally with aspirations every 5 mL.  Performed by: Personally    Procedure Name: LMA Insertion Date/Time: 02/06/2015 7:42 AM Performed by: Baxter Flattery Pre-anesthesia Checklist: Patient identified, Emergency Drugs available, Suction available and Patient being monitored Patient Re-evaluated:Patient Re-evaluated prior to inductionOxygen Delivery Method: Circle System Utilized Preoxygenation: Pre-oxygenation with 100% oxygen Intubation Type: IV induction Ventilation: Mask ventilation without difficulty LMA: LMA inserted LMA Size: 4.0 Number of attempts: 1 Airway Equipment and Method: Bite block Placement Confirmation: positive ETCO2 and breath sounds checked- equal and bilateral Tube secured with: Tape Dental Injury: Teeth and Oropharynx as per pre-operative assessment

## 2015-02-06 NOTE — Op Note (Signed)
Preoperative Diagnosis: left breast cancer  Postoprative Diagnosis: left breast cancer  Procedure: Procedure(s): BLUE DYE INJECTION LEFT BREAST, LEFT BREAST LUMPECTOMY WITH RADIOACTIVE SEED AND LEFT AXILLARY SENTINEL LYMPH NODE BIOPSY   Surgeon: Excell Seltzer T   Assistants: None  Anesthesia:  General LMA anesthesia  Indications: Patient has a recent diagnosis of stage IA cancer of the upper inner quadrant of the left breast. Following extensive preoperative workup and discussion detailed elsewhere we elected to proceed with radioactive seed localized left breast lumpectomy and left axillary sentinel lymph node biopsy as initial surgical therapy. She has previously undergone accurate placement of I-131 radioactive seed at the tumor site. In the holding area she underwent injection of 1 mCi of technetium sulfur colloid intradermally around the left nipple. Seed placement was confirmed with the neoprobe in the holding area.    Procedure Detail: Patient was brought to the operating room, placed in supine position on the operating table, and laryngeal mask general anesthesia induced. She received preoperative IV antibiotics. PAS were in place. Under sterile technique after patient timeout injected 5 mL of dilute methylene blue subcutaneously beneath the left nipple and massaged this for several minutes. Following this the entire anterior chest, breast, axilla and upper arm were widely sterilely prepped and draped. Patient timeout was again performed. The lumpectomy was approached initially. The cecum was localized in the extreme upper inner left breast. I made a transverse incision in the skin crease at this point the dissection was carried down into the saphenous tissue. Due to thin breast tissue in this area I raised fairly thin skin and subcutaneous flaps over the tumor. The dissection was then deepened out in all directions around the seed and taken down to the chest wall and the specimen was  dissected up off the chest wall including the pectoralis fascia. The cecum was confirmed in the specimen. It was inked for margins. Specimen mammogram showed the marking clip in the seed centrally located within the specimen. This was sent for permanent pathology. Hemostasis was obtained in the wound. I mobilized the breast tissue superiorly and inferiorly off the chest wall into the cavity and after marking with clips closest with interrupted 3-0 Vicryl. Subcutaneous tissue was closed with interrupted 3-0 Vicryl. Attention was turned to the sentinel lymph node biopsy. An area of high counts was identified in the left axilla and a small transverse incision made. Dissection was carried down through the subcutaneous tissue and the clavipectoral fascia was incised and the axilla entered. Using the neoprobe for guidance I initially dissected down onto a hot node which was excised with cautery and ex vivo had counts of about 350. This was sent as hot blue axillary sentinel lymph node #1. More posterior to this I found an area of somewhat increased counts and excised this and ex vivo had counts of about 40 but could not definitely identify any nodes in the specimen. This was sent as hot axillary tissue possible sentinel node #2. Following this more inferiorly an area of high counts was again noted and with blunt dissection I came down onto a very high counts and blue dye which was completely excised and ex vivo had counts of over 1000. This was sent as hot blue axillary sentinel lymph node #3. Following this I could not detect any significant elevated counts in the axilla and there were no palpable abnormalities. Hemostasis was assured. The deep and subcutaneous Tissue was closed with interrupted 3-0 Vicryl. Both skin incisions were closed with subcuticular 5-0  Monocryl and Liquiban. Sponge needle and instrument counts were correct.   Findings: As above  Estimated Blood Loss:  Minimal         Drains:  nnone  Blood Given: none          Specimens: #1 left breast lumpectomy  #2 through 4 left axillary sentinel lymph nodes X3        Complications:  * No complications entered in OR log *         Disposition: PACU - hemodynamically stable.         Condition: stable

## 2015-02-06 NOTE — Anesthesia Postprocedure Evaluation (Signed)
Anesthesia Post Note  Patient: Marissa Williams  Procedure(s) Performed: Procedure(s) (LRB): LEFT BREAST LUMPECTOMY WITH RADIOACTIVE SEED AND LEFT AXILLARY SENTINEL LYMPH NODE BIOPSY (Left)  Anesthesia type: general  Patient location: PACU  Post pain: Pain level controlled  Post assessment: Patient's Cardiovascular Status Stable  Last Vitals:  Filed Vitals:   02/06/15 0930  BP: 165/89  Pulse: 70  Temp:   Resp: 13    Post vital signs: Reviewed and stable  Level of consciousness: sedated  Complications: No apparent anesthesia complications

## 2015-02-06 NOTE — Progress Notes (Signed)
Emotional support during breast injections °

## 2015-02-06 NOTE — Anesthesia Preprocedure Evaluation (Addendum)
Anesthesia Evaluation  Patient identified by MRN, date of birth, ID band Patient awake    Reviewed: NPO status , Patient's Chart, lab work & pertinent test results  Airway Mallampati: II  TM Distance: >3 FB Neck ROM: Full    Dental  (+) Teeth Intact, Dental Advisory Given   Pulmonary neg pulmonary ROS,    Pulmonary exam normal       Cardiovascular hypertension, Pt. on medications Normal cardiovascular exam    Neuro/Psych negative neurological ROS  negative psych ROS   GI/Hepatic negative GI ROS, Neg liver ROS,   Endo/Other  negative endocrine ROS  Renal/GU negative Renal ROS     Musculoskeletal   Abdominal   Peds  Hematology   Anesthesia Other Findings   Reproductive/Obstetrics                            Anesthesia Physical Anesthesia Plan  ASA: II  Anesthesia Plan: General   Post-op Pain Management: GA combined w/ Regional for post-op pain   Induction: Rapid sequence  Airway Management Planned: LMA  Additional Equipment:   Intra-op Plan:   Post-operative Plan: Extubation in OR  Informed Consent: I have reviewed the patients History and Physical, chart, labs and discussed the procedure including the risks, benefits and alternatives for the proposed anesthesia with the patient or authorized representative who has indicated his/her understanding and acceptance.   Dental advisory given  Plan Discussed with: CRNA, Anesthesiologist and Surgeon  Anesthesia Plan Comments:        Anesthesia Quick Evaluation

## 2015-02-07 ENCOUNTER — Encounter (HOSPITAL_BASED_OUTPATIENT_CLINIC_OR_DEPARTMENT_OTHER): Payer: Self-pay | Admitting: General Surgery

## 2015-02-11 ENCOUNTER — Telehealth: Payer: Self-pay | Admitting: *Deleted

## 2015-02-11 ENCOUNTER — Telehealth: Payer: Self-pay | Admitting: Family Medicine

## 2015-02-11 NOTE — Telephone Encounter (Signed)
Please inform patient that two FMLA forms received Form reviewed and forwarded to patient's oncologist who will be better able to fill out of the form based on patient's treatment plan.

## 2015-02-11 NOTE — Telephone Encounter (Signed)
Date of birth verified  Pt notified FMLA form fax to pt oncologist today. Pt verbalize understanding

## 2015-02-11 NOTE — Telephone Encounter (Signed)
Received order per Dr. Jana Hakim for oncotype testing. Requisition sent to pathology. Received by Alyse Low.

## 2015-02-19 ENCOUNTER — Encounter (HOSPITAL_COMMUNITY): Payer: Self-pay

## 2015-02-20 ENCOUNTER — Encounter: Payer: Self-pay | Admitting: *Deleted

## 2015-02-20 ENCOUNTER — Telehealth: Payer: Self-pay | Admitting: *Deleted

## 2015-02-20 NOTE — Telephone Encounter (Signed)
Spoke to Marissa Williams concerning new appt with Dr. Jana Hakim on 02/21/15 at 8:00AM. Confirmed and r/s appt.

## 2015-02-20 NOTE — Progress Notes (Signed)
Received Oncotype score of 29/19%. Copy given to Dr. Jana Hakim. Original to HIM for scanning.

## 2015-02-21 ENCOUNTER — Telehealth: Payer: Self-pay | Admitting: *Deleted

## 2015-02-21 ENCOUNTER — Ambulatory Visit (HOSPITAL_BASED_OUTPATIENT_CLINIC_OR_DEPARTMENT_OTHER): Payer: 59 | Admitting: Oncology

## 2015-02-21 VITALS — BP 154/83 | HR 79 | Temp 97.7°F | Resp 20 | Ht 63.0 in | Wt 151.2 lb

## 2015-02-21 DIAGNOSIS — Z170 Estrogen receptor positive status [ER+]: Secondary | ICD-10-CM | POA: Diagnosis not present

## 2015-02-21 DIAGNOSIS — C50212 Malignant neoplasm of upper-inner quadrant of left female breast: Secondary | ICD-10-CM | POA: Diagnosis not present

## 2015-02-21 NOTE — Progress Notes (Signed)
[place]  [phone] [phone]     ID: Jaicey Sweaney DOB: 05/29/1951  MR#: [id number]  ZWC#:[id number]  Patient Care Team: Boykin Nearing, MD as PCP - General (Family Medicine) Excell Seltzer, MD as Consulting Physician (General Surgery) Chauncey Cruel, MD as Consulting Physician (Oncology) Gery Pray, MD as Consulting Physician (Radiation Oncology) Mauro Kaufmann, RN as Registered Nurse Rockwell Germany, RN as Registered Nurse Sylvan Cheese, NP as Nurse Practitioner (Nurse Practitioner) PCP: Minerva Ends, MD OTHER MD:  CHIEF COMPLAINT: Estrogen receptor positive breast cancer  CURRENT TREATMENT: considering chemotherapy   BREAST CANCER HISTORY: From the original intake note:  Jane noted a change in her left breast sometime in June 2016. She had had bilateral screening mammography at [facility] 02/12/2014 with no suspicious findings. Eventually [name] brought her concern to medical attention and her primary care physician set her up for bilateral left diagnostic mammography with tomography and [facility] August 03/04/2015. The breast density was category B. In the region of a palpable mass (10:30 position left breast 9 cm from the nipple) there was a spiculated mass measuring approximately 10 mm on mammography. On ultrasound this was an irregular hypoechoic mass measuring 9 mm. There was no internal vascular flow. The mass abuts the pectoralis muscle but does not appear to invade it. Left axillary ultrasound showed normal appearing lymph nodes.  Biopsy of the left breast mass in question 01/21/2015 showed ([id number]) an invasive ductal carcinoma, E-cadherin positive, grade 2, estrogen receptor 100% positive, with strong staining intensity; progesterone receptor negative, with an MIB-1 of 20% and no HER-2 amplification, the signals ratio being 1.48 and the number per cell  2.00.  Her subsequent history is as detailed below  INTERVAL HISTORY: Shekita returns today for follow-up of her breast cancer accompanied by her son Will. Since her last visit here she underwent left lumpectomy and sentinel lymph node sampling on 02/06/2015. Final pathology ([id number]) showed an invasive ductal carcinoma, grade 2, measuring 1.5 cm. Both sentinel lymph nodes were clear. Margins were negative.--An Oncotype DX was sent from this sample. It gave her a score of 29. This predicts a 10 year risk of recurrence outside the breast of 19%, if the patient's only systemic treatment is tamoxifen for 5 years.  The patient is here today to discuss these results and specifically decide for against chemotherapy.  REVIEW OF SYSTEMS: In general. he she did well with the surgery, with no unusual pain, bleeding, or fever. She does have numbness and discomfort in the lateral aspect of the left breast and the axilla and reduced range of motion of the left upper extremity. She has not yet gone to the ABC class. There has not been any left upper extremity lymphedema. A detailed review of systems today was otherwise stable.  PAST MEDICAL HISTORY: Past Medical History  Diagnosis Date  . Hypertension Dx 2008  . Breast cancer of upper-inner quadrant of left female breast 01/27/2015    PAST SURGICAL HISTORY: Past Surgical History  Procedure Laterality Date  . Tubal ligation  1970  . Abdominal hysterectomy  1980    uterine prolapse   . Breast lumpectomy with radioactive seed and sentinel lymph node biopsy Left 02/06/2015    Procedure: LEFT BREAST LUMPECTOMY WITH RADIOACTIVE SEED AND LEFT AXILLARY SENTINEL LYMPH NODE BIOPSY;  Surgeon: Excell Seltzer, MD;  Location: [facility];  Service: General;  Laterality: Left;    FAMILY HISTORY  Family History  Problem Relation Age of Onset  . Alzheimer's disease Father   . Cancer Mother     throat cancer- smoker and ETOH   . Heart disease Neg  Hx   . Diabetes Neg Hx    the patient's father died at the age of 27 with Alzheimer's disease. The patient's mother died from what seemed to have been DTs at the age of 76. She had a history of throat cancer and EtOH. The patient was an only child. There is no other history of cancer in the family to her knowledge   GYNECOLOGIC HISTORY:  No LMP recorded. Patient has had a hysterectomy.  menarche age 46, first live birth age 8. The patient is GX P4. She underwent hysterectomy without salpingo-oophorectomy in 1980. She did not take hormone replacement.   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 She is very keen to continue working there and does not want her cancer treatment to interfere if at all possible. She is single, and lives by herself, with no pets. Her son [name] lives in [place] where he works as an Surveyor, minerals. Her son [name] is a Pharmacist, hospital working in [place]. Daughter [name] lives in [place] currently under unfortunate circumstances. The patient's fourth child a son, was born premature and died at 29 months from lung problems. [name] has 7 grandchildren and 3 great-grandchildren. She attends a local [employer]:  not in place    HEALTH MAINTENANCE: Social History  Substance Use Topics  . Smoking status: Never Smoker   . Smokeless tobacco: Never Used  . Alcohol Use: Yes     Comment: occas     Colonoscopy: October 2014/[facility]  PAP: status post hysterectomy   Bone density: never   Lipid panel:  No Known Allergies  Current Outpatient Prescriptions  Medication Sig Dispense Refill  . hydrochlorothiazide (HYDRODIURIL) 25 MG tablet Take 1 tablet (25 mg total) by mouth daily. 90 tablet 3   No current facility-administered medications for this visit.    OBJECTIVE: Middle-aged African-American woman in no acute distress  Filed Vitals:   02/21/15 0810   BP: 154/83  Pulse: 79  Temp: 97.7 F (36.5 C)  Resp: 20     Body mass index is 26.79 kg/(m^2).    ECOG FS:1 - Symptomatic but completely ambulatory  Sclerae unicteric, pupils round and equal Oropharynx clear and moist-- no thrush or other lesions No cervical or supraclavicular adenopathy Lungs no rales or rhonchi Heart regular rate and rhythm Abd soft, nontender, positive bowel sounds MSK no focal spinal tenderness, no upper extremity lymphedema Neuro: nonfocal, well oriented, appropriate affect Breasts: The right breast is unremarkable. The left breast is status post recent lumpectomy. The incision is healing nicely, with no dehiscence, erythema, or swelling. There is minimal induration subjacent to the incision. The left axillary incision is similarly healing well.    LAB RESULTS:  CMP     Component Value Date/Time   NA 141 01/29/2015 1204   NA 140 04/16/2014 1141   K 3.4* 01/29/2015 1204   K 4.4 04/16/2014 1141   CL 104 04/16/2014 1141   CO2 27 01/29/2015 1204   CO2 28 04/16/2014 1141   GLUCOSE 159* 01/29/2015 1204   GLUCOSE 87 04/16/2014 1141   BUN 16.8 01/29/2015 1204   BUN 12 04/16/2014 1141   CREATININE 0.8 01/29/2015 1204  CREATININE 0.68 04/16/2014 1141   CREATININE 0.76 03/24/2010 2121   CALCIUM 9.5 01/29/2015 1204   CALCIUM 9.6 04/16/2014 1141   PROT 7.8 01/29/2015 1204   PROT 7.1 04/16/2014 1141   ALBUMIN 4.0 01/29/2015 1204   ALBUMIN 4.0 04/16/2014 1141   AST 17 01/29/2015 1204   AST 18 04/16/2014 1141   ALT 13 01/29/2015 1204   ALT 11 04/16/2014 1141   ALKPHOS 56 01/29/2015 1204   ALKPHOS 53 04/16/2014 1141   BILITOT 0.56 01/29/2015 1204   BILITOT 0.5 04/16/2014 1141   GFRNONAA >89 04/16/2014 1141   GFRNONAA 60* 03/06/2009 1800   GFRAA >89 04/16/2014 1141   GFRAA  03/06/2009 1800    >60        The eGFR has been calculated using the MDRD equation. This calculation has not been validated in all clinical situations. eGFR's persistently <60  mL/min signify possible Chronic Kidney Disease.    INo results found for: SPEP, UPEP  Lab Results  Component Value Date   WBC 6.8 01/29/2015   NEUTROABS 3.7 01/29/2015   HGB 13.8 01/29/2015   HCT 39.8 01/29/2015   MCV 82.2 01/29/2015   PLT 304 01/29/2015      Chemistry      Component Value Date/Time   NA 141 01/29/2015 1204   NA 140 04/16/2014 1141   K 3.4* 01/29/2015 1204   K 4.4 04/16/2014 1141   CL 104 04/16/2014 1141   CO2 27 01/29/2015 1204   CO2 28 04/16/2014 1141   BUN 16.8 01/29/2015 1204   BUN 12 04/16/2014 1141   CREATININE 0.8 01/29/2015 1204   CREATININE 0.68 04/16/2014 1141   CREATININE 0.76 03/24/2010 2121      Component Value Date/Time   CALCIUM 9.5 01/29/2015 1204   CALCIUM 9.6 04/16/2014 1141   ALKPHOS 56 01/29/2015 1204   ALKPHOS 53 04/16/2014 1141   AST 17 01/29/2015 1204   AST 18 04/16/2014 1141   ALT 13 01/29/2015 1204   ALT 11 04/16/2014 1141   BILITOT 0.56 01/29/2015 1204   BILITOT 0.5 04/16/2014 1141       No results found for: LABCA2  No components found for: LABCA125  No results for input(s): INR in the last 168 hours.  Urinalysis    Component Value Date/Time   COLORURINE YELLOW 03/06/2009 1804   APPEARANCEUR CLEAR 03/06/2009 1804   LABSPEC 1.026 03/06/2009 1804   PHURINE 5.5 03/06/2009 1804   GLUCOSEU NEGATIVE 03/06/2009 1804   HGBUR LARGE* 03/06/2009 1804   BILIRUBINUR SMALL* 03/06/2009 1804   KETONESUR 40* 03/06/2009 1804   PROTEINUR NEGATIVE 03/06/2009 1804   UROBILINOGEN 1.0 03/06/2009 1804   NITRITE NEGATIVE 03/06/2009 1804   LEUKOCYTESUR TRACE* 03/06/2009 1804    STUDIES: Nm Sentinel Node Inj-no Rpt (breast)  02/06/2015   CLINICAL DATA: cancer left breast   Sulfur colloid was injected intradermally by the nuclear medicine  technologist for breast cancer sentinel node localization.    Mm Breast Surgical Specimen  02/06/2015   CLINICAL DATA:  Intraoperative imaging, left breast lumpectomy  EXAM: SPECIMEN  RADIOGRAPH OF THE left BREAST  COMPARISON:  Previous exam(s).  FINDINGS: Status post excision of the left breast. The radioactive seed and biopsy marker clip are present, completely intact, and were marked for pathology. Findings discussed with the operating room by Dr. Nyoka Cowden at 8:10 a.m.  IMPRESSION: Specimen radiograph of the left breast.   Electronically Signed   By: Conchita Paris M.D.   On: 02/06/2015 08:12  Mm Lt Radioactive Seed Loc Mammo Guide  02/05/2015   CLINICAL DATA:  Patient presents for radioactive seed localization prior to left lumpectomy.  EXAM: MAMMOGRAPHIC GUIDED RADIOACTIVE SEED LOCALIZATION OF THE LEFT BREAST  COMPARISON:  Previous exam(s).  FINDINGS: Patient presents for radioactive seed localization prior to lumpectomy. I met with the patient and we discussed the procedure of seed localization including benefits and alternatives. We discussed the high likelihood of a successful procedure. We discussed the risks of the procedure including infection, bleeding, tissue injury and further surgery. We discussed the low dose of radioactivity involved in the procedure. Informed, written consent was given.  The usual time-out protocol was performed immediately prior to the procedure.  Using mammographic guidance, sterile technique, 2% lidocaine and an I-125 radioactive seed, mass and clip in the medial portion of the left breast was localized using a medial approach. The follow-up mammogram images confirm the seed in the expected location and were marked for Dr. Excell Seltzer.  Follow-up survey of the patient confirms presence of the radioactive seed.  Order number of I-125 seed:  [id number].  Total activity:  0.249 mCi  Reference Date: 01/31/2015  The patient tolerated the procedure well and was released from the [facility]. She was given instructions regarding seed removal.  IMPRESSION: Radioactive seed localization of the left breast. No apparent complications.   Electronically Signed   By:  Nolon Nations M.D.   On: 02/05/2015 15:33    ASSESSMENT: 64 y.o. [place] woman status post left breast biopsy 01/21/2015 for a clinical T1b N0, stage IA invasive ductal carcinoma, grade 1 or 2, strongly estrogen receptor positive, progesterone receptor negative, with no HER-2 amplification and an MIB-1 of 20%  (1) left lumpectomy and sentinel lymph node sampling 02/06/2015 showed a pT1c pN0, stage IA invasive ductal carcinoma, grade 2, with negative margins   (2) OncotypeDX score of 29 falls in the intermediate range and predicts a 10 year risk of outside the breast recurrence of 19% if the patient's only systemic therapy is tamoxifen for 5 years   (3) adjuvant radiation to follow surgery  (4) anti-estrogens for 5-10 years to follow radiation  PLAN: I discussed the results of the surgery and the Oncotype in detail with Caylei and her son. I gave them that information in writing. She understands she had an early stage breast cancer and that from a local treatment point of view she will need radiation to "clean up" after the surgery and further reduce the risk of the cancer coming back in the breast.  She has a good understanding of the possibility of occult systemic disease, which would have occurred, if it has occurred, before her surgery. She understands that CT scans and CAT scans cannot rule out microscopic occult disease.  In this regard the Oncotype is very useful. It says that if she takes tamoxifen for 5 years as her only systemic treatment the risk of recurrence outside the breast, generally in the liver, lungs, or bones, would be 19%.  In general, chemotherapy can lower risk by about a third so I quoted her a 7% risk reduction with chemotherapy. This means if I treated 100 women like her with chemotherapy, in addition to the anti-estrogens, the recurrence rate would be 12% instead of 19%. Another way to look at it is that 7 out of 100 women like her would get a very major benefit  from chemotherapy, and 93 do not get any benefit (81 of them were already "cured" without chemotherapy, and  an additional 12 have the cancer come back despite chemotherapy  In this situation our recommendation is for chemotherapy and specifically she would receive cyclophosphamide and docetaxel every 3 weeks 4, by way of a port. The patient is very concerned about her job. In many cases people can work through this chemotherapy--day mass chemotherapy day and the next 3 days. That is possible in Almyra's case. 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  For that reason on my recommendation to her is that she consider temporary disability for 3 months.  Again she is very concerned about her job. Accordingly the plan at this point is for her to meet with human resources. If she can be assured that she will have a job at the end of 3 months she would prefer to undergo disability and receive her chemotherapy. Otherwise she will have to decide what to do.   She understands that whatever she decides regarding chemotherapy she still will need radiation and anti-estrogens as discussed  Nialah will call me back within the next few days, after she has met with human resources, and we will finalize a treatment plan for her at that time.  Chauncey Cruel, MD   02/21/2015 8:36 AM Medical Oncology and Hematology [facility] [address] [place], [place] [address] Tel. [phone]    Fax. [phone]

## 2015-02-21 NOTE — Telephone Encounter (Signed)
This RN returned call to pt per her message stating " I have decided not to do the chemo "  Per phone discussion pt states reason for deciding not to do chemo as-  She is not eligible for FMLA.  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  This RN informed Santita MD is now out of the office- but this call will be reviewed with him.  This RN will return call to pt next week.

## 2015-02-25 ENCOUNTER — Telehealth: Payer: Self-pay | Admitting: *Deleted

## 2015-02-25 ENCOUNTER — Encounter: Payer: Self-pay | Admitting: *Deleted

## 2015-02-25 ENCOUNTER — Telehealth: Payer: Self-pay | Admitting: Oncology

## 2015-02-25 ENCOUNTER — Other Ambulatory Visit: Payer: Self-pay | Admitting: Oncology

## 2015-02-25 DIAGNOSIS — C50212 Malignant neoplasm of upper-inner quadrant of left female breast: Secondary | ICD-10-CM

## 2015-02-25 MED ORDER — ANASTROZOLE 1 MG PO TABS: 1.0000 mg | ORAL_TABLET | Freq: Every day | ORAL | Status: DC

## 2015-02-25 NOTE — Telephone Encounter (Signed)
-----  Message from Mauro Kaufmann, RN sent at 02/25/2015  9:35 AM EDT ----- Regarding: care plan Hello Team,  Marissa Williams had an oncotype score of 29. She met with Dr. Jana Hakim to discuss chemo. After the pt discussed this with her family, she has decided to forgo chemo and xrt. She is going to take anastrozole and join the "holliness place" to get on a diet plan and take some supplements. She will f/u with Dr. Jana Hakim in 2-3 months.  Thanks, [employer]

## 2015-02-25 NOTE — Telephone Encounter (Signed)
Called patient and she is aware of her survivorship appointment

## 2015-02-25 NOTE — Telephone Encounter (Signed)
Left vm for pt to return call concerning her plans for treatment. Contact information given.

## 2015-02-25 NOTE — Telephone Encounter (Signed)
No entry 

## 2015-02-25 NOTE — Telephone Encounter (Signed)
Spoke to pt concerning her decision on treatment plan. Pt relate she would like to forgo chemotherapy and xrt and only take "the pill for 5 yrs". Informed pt that we will call in a prescription for anastrozole and make a f/u appt with Dr. Jana Hakim in 2-3 months. Received verbal understanding. Discussed support groups and support activities.  Denies further needs at this time. Encourage pt to call with questions or concerns.

## 2015-02-25 NOTE — Progress Notes (Signed)
Left vm informing pt of survivorship program referral.

## 2015-02-26 ENCOUNTER — Telehealth: Payer: Self-pay | Admitting: *Deleted

## 2015-02-26 NOTE — Telephone Encounter (Signed)
Spoke to pt to see if she would be willing to speak to Dr. Sondra Come about xrt. Relate she would talk to him even though she is choosing not to get xrt. Referral placed.

## 2015-02-27 ENCOUNTER — Encounter: Payer: Self-pay | Admitting: Oncology

## 2015-02-27 NOTE — Progress Notes (Signed)
Pt came in to go over applications given for [employer] for assistance. I will look over them and contact patient if additional information needed to submit. Faxed application for patient to [employer]. Approved pt for $1000 [employer].

## 2015-03-03 ENCOUNTER — Other Ambulatory Visit: Payer: Self-pay | Admitting: Oncology

## 2015-03-03 ENCOUNTER — Other Ambulatory Visit: Payer: Self-pay

## 2015-03-03 DIAGNOSIS — C50212 Malignant neoplasm of upper-inner quadrant of left female breast: Secondary | ICD-10-CM

## 2015-03-04 ENCOUNTER — Other Ambulatory Visit (HOSPITAL_BASED_OUTPATIENT_CLINIC_OR_DEPARTMENT_OTHER): Payer: 59

## 2015-03-04 DIAGNOSIS — C50212 Malignant neoplasm of upper-inner quadrant of left female breast: Secondary | ICD-10-CM

## 2015-03-04 LAB — CBC WITH DIFFERENTIAL/PLATELET
BASO%: 0.5 % (ref 0.0–2.0)
BASOS ABS: 0 10*3/uL (ref 0.0–0.1)
EOS ABS: 0.1 10*3/uL (ref 0.0–0.5)
EOS%: 1.5 % (ref 0.0–7.0)
HEMATOCRIT: 38 % (ref 34.8–46.6)
HEMOGLOBIN: 13.3 g/dL (ref 11.6–15.9)
LYMPH%: 30.2 % (ref 14.0–49.7)
MCH: 28.6 pg (ref 25.1–34.0)
MCHC: 35 g/dL (ref 31.5–36.0)
MCV: 81.5 fL (ref 79.5–101.0)
MONO#: 0.7 10*3/uL (ref 0.1–0.9)
MONO%: 7.2 % (ref 0.0–14.0)
NEUT#: 5.5 10*3/uL (ref 1.5–6.5)
NEUT%: 60.6 % (ref 38.4–76.8)
Platelets: 312 10*3/uL (ref 145–400)
RBC: 4.66 10*6/uL (ref 3.70–5.45)
RDW: 13.4 % (ref 11.2–14.5)
WBC: 9.1 10*3/uL (ref 3.9–10.3)
lymph#: 2.8 10*3/uL (ref 0.9–3.3)

## 2015-03-04 LAB — COMPREHENSIVE METABOLIC PANEL (CC13)
ALBUMIN: 3.8 g/dL (ref 3.5–5.0)
ALK PHOS: 53 U/L (ref 40–150)
ALT: 13 U/L (ref 0–55)
AST: 14 U/L (ref 5–34)
Anion Gap: 7 mEq/L (ref 3–11)
BUN: 16.6 mg/dL (ref 7.0–26.0)
CALCIUM: 9.7 mg/dL (ref 8.4–10.4)
CO2: 29 mEq/L (ref 22–29)
Chloride: 104 mEq/L (ref 98–109)
Creatinine: 0.8 mg/dL (ref 0.6–1.1)
Glucose: 84 mg/dl (ref 70–140)
POTASSIUM: 3.8 meq/L (ref 3.5–5.1)
SODIUM: 141 meq/L (ref 136–145)
Total Bilirubin: 0.43 mg/dL (ref 0.20–1.20)
Total Protein: 7.3 g/dL (ref 6.4–8.3)

## 2015-03-05 ENCOUNTER — Ambulatory Visit: Payer: 59

## 2015-03-05 NOTE — Progress Notes (Signed)
Location of Breast Cancer: Breast cancer of upper-inner quadrant of left female breast.  Histology per Pathology Report:   02/06/15 Diagnosis 1. Breast, lumpectomy, Left - INVASIVE DUCTAL CARCINOMA, SEE COMMENT. - POSITIVE FOR LYMPHOVASCULAR INVASION. - POSITIVE FOR PERINEURAL INVASION. - PREVIOUS BIOPSY SITE. - SEE TUMOR SYNOPTIC TEMPLATE BELOW. 2. Lymph node, sentinel, biopsy, Left - ONE LYMPH NODE, NEGATIVE FOR TUMOR (0/1). 3. Fatty tissue, ? lymph node, left axillary - BENIGN FIBROFATTY SOFT TISSUE. - NEGATIVE FOR LYMPH NODE. - NEGATIVE FOR ATYPIA OR MALIGNANCY. 4. Lymph node, sentinel, biopsy, Left - ONE LYMPH NODE, NEGATIVE FOR TUMOR (0/1).  01/21/15 Diagnosis Breast, left, needle core biopsy, 10:30 o'clock, 8 to 9 CMFN - INVASIVE MAMMARY CARCINOMA.  Receptor Status: ER(100%), PR (0%), Her2-neu (negative)  Did patient present with symptoms (if so, please note symptoms) or was this found on screening mammography?: Patient noticed a palpable mass in the upper medial left breast 4-6 weeks ago.  Past/Anticipated interventions by surgeon, if any: 02/06/15  -Procedure: LEFT BREAST LUMPECTOMY WITH RADIOACTIVE SEED AND LEFT AXILLARY SENTINEL LYMPH NODE BIOPSY;  Surgeon: Excell Seltzer, MD;  Location: [facility];  Service: General;  Laterality: Left;   Past/Anticipated interventions by medical oncology, if any: Per Dr. Jana Hakim: "chemotherapy and specifically she would receive cyclophosphamide and docetaxel every 3 weeks 4, by way of a port."  Patient states she is not doing chemotherapy.  She started taking Arimidex last Friday.   Lymphedema issues, if any:  No  Pain issues, if any:  Yes in left shoulder with lifting her left arm.  She rates it at an 8/10.  She has not had physical therapy.  GYNECOLOGIC HISTORY: No LMP recorded. Patient has had a hysterectomy. menarche age 71, first live birth age 87. The patient is GX P4. She underwent hysterectomy without  salpingo-oophorectomy in 1980. She did not take hormone replacement.   SAFETY ISSUES:  Prior radiation? no  Pacemaker/ICD? no  Possible current pregnancy?no  Is the patient on methotrexate? no  Current Complaints / other details:  Patient works in housekeeping at [employer].  BP 120/81 mmHg  Pulse 75  Temp(Src) 97.7 F (36.5 C) (Oral)  Resp 16  Ht $R'5\' 3"'gP$  (1.6 m)  Wt 150 lb 4.8 oz (68.176 kg)  BMI 26.63 kg/m2

## 2015-03-06 ENCOUNTER — Encounter: Payer: Self-pay | Admitting: Radiation Oncology

## 2015-03-06 ENCOUNTER — Ambulatory Visit
Admission: RE | Admit: 2015-03-06 | Discharge: 2015-03-06 | Disposition: A | Discharge status: Home / Self Care | Payer: 59 | Source: Ambulatory Visit | Attending: Radiation Oncology | Admitting: Radiation Oncology

## 2015-03-06 VITALS — BP 120/81 | HR 75 | Temp 97.7°F | Resp 16 | Ht 63.0 in | Wt 150.3 lb

## 2015-03-06 DIAGNOSIS — Z809 Family history of malignant neoplasm, unspecified: Secondary | ICD-10-CM | POA: Insufficient documentation

## 2015-03-06 DIAGNOSIS — F1010 Alcohol abuse, uncomplicated: Secondary | ICD-10-CM | POA: Insufficient documentation

## 2015-03-06 DIAGNOSIS — C50212 Malignant neoplasm of upper-inner quadrant of left female breast: Secondary | ICD-10-CM | POA: Insufficient documentation

## 2015-03-06 DIAGNOSIS — I10 Essential (primary) hypertension: Secondary | ICD-10-CM | POA: Diagnosis not present

## 2015-03-06 DIAGNOSIS — Z510 Encounter for antineoplastic radiation therapy: Secondary | ICD-10-CM | POA: Diagnosis not present

## 2015-03-06 DIAGNOSIS — Z820 Family history of epilepsy and other diseases of the nervous system: Secondary | ICD-10-CM | POA: Diagnosis not present

## 2015-03-06 DIAGNOSIS — Z170 Estrogen receptor positive status [ER+]: Secondary | ICD-10-CM | POA: Insufficient documentation

## 2015-03-06 DIAGNOSIS — Z90710 Acquired absence of both cervix and uterus: Secondary | ICD-10-CM | POA: Insufficient documentation

## 2015-03-06 NOTE — Progress Notes (Signed)
Radiation Oncology         [phone] ________________________________   Reevaluation note  Name: Marissa Williams MRN: [id number]  Date: 03/06/2015  DOB: 09/01/1950  CC:FUNCHES, JOSALYN C, MD  Hoxworth, Benjamin, MD   REFERRING PHYSICIAN: Hoxworth, Benjamin, MD  DIAGNOSIS: The encounter diagnosis was Breast cancer of upper-inner quadrant of left female breast. Invasive mammary carcinoma,  Grade II, ER+ PR(-) HER-2(-) Ki67 20%, pT1c,pN0  HISTORY OF PRESENT ILLNESS:Marissa Williams is a 64 y.o. female who is presenting to clinic in regards to her breast cancer. She was initially seen in the multidisciplinary breast clinic She has recently completed surgery on 02/06/2015. She has no complaints in regards to her recovery from her surgery. The patient projects a healthy mental status and was not accompanied by family for today's radiation oncology visit. She has been self-administering her hormone therapy for about two weeks. In the past she has expressed concerns about being able to attend work during treatment. She is currently employed by [employer] for 8:00am-4:30pm shifts.  PREVIOUS RADIATION THERAPY: No  PAST MEDICAL HISTORY:  has a past medical history of Hypertension (Dx 2008) and Breast cancer of upper-inner quadrant of left female breast (01/27/2015).    PAST SURGICAL HISTORY: Past Surgical History  Procedure Laterality Date  . Tubal ligation  1970  . Abdominal hysterectomy  1980    uterine prolapse   . Breast lumpectomy with radioactive seed and sentinel lymph node biopsy Left 02/06/2015    Procedure: LEFT BREAST LUMPECTOMY WITH RADIOACTIVE SEED AND LEFT AXILLARY SENTINEL LYMPH NODE BIOPSY;  Surgeon: Benjamin Hoxworth, MD;  Location: [facility];  Service: General;  Laterality: Left;    FAMILY HISTORY: family history includes Alzheimer's disease in her father; Cancer in her mother. There is no history of Heart disease or Diabetes.  SOCIAL HISTORY:  reports that she has  never smoked. She has never used smokeless tobacco. She reports that she drinks alcohol. She reports that she does not use illicit drugs.  ALLERGIES: Review of patient's allergies indicates no known allergies.  MEDICATIONS:  Current Outpatient Prescriptions  Medication Sig Dispense Refill  . anastrozole (ARIMIDEX) 1 MG tablet Take 1 tablet (1 mg total) by mouth daily. 90 tablet 4  . hydrochlorothiazide (HYDRODIURIL) 25 MG tablet Take 1 tablet (25 mg total) by mouth daily. 90 tablet 3   No current facility-administered medications for this encounter.    REVIEW OF SYSTEMS:  A 15 point review of systems is documented in the electronic medical record. This was obtained by the nursing staff. However, I reviewed this with the patient to discuss relevant findings and make appropriate changes. No breast pain, arm swelling. No problems after her surgery   PHYSICAL EXAM:  height is 5' 3" (1.6 m) and weight is 150 lb 4.8 oz (68.176 kg). Her oral temperature is 97.7 F (36.5 C). Her blood pressure is 120/81 and her pulse is 75. Her respiration is 16.   BP 120/81 mmHg  Pulse 75  Temp(Src) 97.7 F (36.5 C) (Oral)  Resp 16  Ht 5' 3" (1.6 m)  Wt 150 lb 4.8 oz (68.176 kg)  BMI 26.63 kg/m2  the left breast area shows a well healing scar in the upper inner quadrant. A small scar noted in the axillary region also without signs of infection. The lungs are clear or the heart has a regular rhythm and rate. No palpable supraclavicular or axillary adenopathy.                                                                   ECOG = 0  LABORATORY DATA:  Lab Results  Component Value Date   WBC 9.1 03/04/2015   HGB 13.3 03/04/2015   HCT 38.0 03/04/2015   MCV 81.5 03/04/2015   PLT 312 03/04/2015   NEUTROABS 5.5 03/04/2015   Lab Results  Component Value Date   NA 141 03/04/2015   K 3.8 03/04/2015   CL 104 04/16/2014   CO2 29 03/04/2015   GLUCOSE 84 03/04/2015   CREATININE 0.8  03/04/2015   CALCIUM 9.7 03/04/2015      RADIOGRAPHY: Nm Sentinel Node Inj-no Rpt (breast)  02/06/2015   CLINICAL DATA: cancer left breast   Sulfur colloid was injected intradermally by the nuclear medicine  technologist for breast cancer sentinel node localization.    Mm Breast Surgical Specimen  02/06/2015   CLINICAL DATA:  Intraoperative imaging, left breast lumpectomy  EXAM: SPECIMEN RADIOGRAPH OF THE left BREAST  COMPARISON:  Previous exam(s).  FINDINGS: Status post excision of the left breast. The radioactive seed and biopsy marker clip are present, completely intact, and were marked for pathology. Findings discussed with the operating room by Dr. Green at 8:10 a.m.  IMPRESSION: Specimen radiograph of the left breast.   Electronically Signed   By: Gretchen  Green M.D.   On: 02/06/2015 08:12   Mm Lt Radioactive Seed Loc Mammo Guide  02/05/2015   CLINICAL DATA:  Patient presents for radioactive seed localization prior to left lumpectomy.  EXAM: MAMMOGRAPHIC GUIDED RADIOACTIVE SEED LOCALIZATION OF THE LEFT BREAST  COMPARISON:  Previous exam(s).  FINDINGS: Patient presents for radioactive seed localization prior to lumpectomy. I met with the patient and we discussed the procedure of seed localization including benefits and alternatives. We discussed the high likelihood of a successful procedure. We discussed the risks of the procedure including infection, bleeding, tissue injury and further surgery. We discussed the low dose of radioactivity involved in the procedure. Informed, written consent was given.  The usual time-out protocol was performed immediately prior to the procedure.  Using mammographic guidance, sterile technique, 2% lidocaine and an I-125 radioactive seed, mass and clip in the medial portion of the left breast was localized using a medial approach. The follow-up mammogram images confirm the seed in the expected location and were marked for Dr. Hoxworth.  Follow-up survey of the patient  confirms presence of the radioactive seed.  Order number of I-125 seed:  [id number].  Total activity:  0.249 mCi  Reference Date: 01/31/2015  The patient tolerated the procedure well and was released from the [facility]. She was given instructions regarding seed removal.  IMPRESSION: Radioactive seed localization of the left breast. No apparent complications.   Electronically Signed   By: Elizabeth  Brown M.D.   On: 02/05/2015 15:33      IMPRESSION: Invasive mammary carcinoma,  Grade II, ER+ PR(-) HER-2(-) Ki67 20%, pT1c,pN0. The patient would be a good candidate for breast conservation therapy with radiation treatments directed at the left breast.  She appears to understand wishes to proceed with planned course of treatment. The patient has met with medical oncology and she has decided this time not to proceed with adjuvant chemotherapy.    PLAN: The patient will discontinue appropriate administration of hormone therapy as previously instructed until after she completes her radiation therapy. The benefits, risks, and purpose of radiation therapy was reviewed in detail. The patient was educated on common symptoms to expect during radiation treatment and healthy methods of management to address these symptoms if they are to occur. The   traditional process she will experience during radiation therapy treatments, including the purpose and definition of a CT scan, simulation planning session, and the appropriate time frame to set aside daily for treatments was discussed. She was sent home with additional educational resources that she can access via the Internet. She is aware of her CT scan and simulation planning session to take place as scheduled, Tuesday 03/11/2015 at 11:00pm . She is aware of her follow-up appointment with radiation oncology to take place as scheduled and to notify Dr. Sondra Come, MD if any symptoms of concern occur. In regards to the specific area in which the patient is being treated, symptoms  of dry skin "like a sunburn" were explained and she understands that she can treat this with provided cream. If she needs more cream at any point during her treatment in the future, it will be provided. All vocalized questions and concerns have been addressed. If the patient develops any further questions or concerns in regards to her treatment and recovery, she has been encouraged to contact Dr. Sondra Come, MD. Consent documentation was reviewed and signed.    This document serves as a record of services personally performed by Gery Pray, MD. It was created on his behalf by Lenn Cal, a trained medical scribe. The creation of this record is based on the scribe's personal observations and the Antonietta Lansdowne's statements to them. This document has been checked and approved by the attending Maram Bently.    -----------------------------------  Blair Promise, PhD, MD

## 2015-03-06 NOTE — Progress Notes (Signed)
Please see the Nurse Progress Note in the MD Initial Consult Encounter for this patient. 

## 2015-03-10 ENCOUNTER — Encounter: Payer: Self-pay | Admitting: *Deleted

## 2015-03-11 ENCOUNTER — Ambulatory Visit
Admission: RE | Admit: 2015-03-11 | Discharge: 2015-03-11 | Disposition: A | Discharge status: Home / Self Care | Payer: 59 | Source: Ambulatory Visit | Attending: Radiation Oncology | Admitting: Radiation Oncology

## 2015-03-11 ENCOUNTER — Ambulatory Visit: Payer: 59 | Admitting: Oncology

## 2015-03-11 ENCOUNTER — Other Ambulatory Visit: Payer: Self-pay | Admitting: *Deleted

## 2015-03-11 DIAGNOSIS — Z510 Encounter for antineoplastic radiation therapy: Secondary | ICD-10-CM | POA: Diagnosis not present

## 2015-03-11 DIAGNOSIS — C50212 Malignant neoplasm of upper-inner quadrant of left female breast: Secondary | ICD-10-CM

## 2015-03-11 NOTE — Progress Notes (Signed)
  Radiation Oncology         (336) [phone] ________________________________  Name: Marissa Williams MRN: [id number]  Date: 03/11/2015  DOB: 1950/10/17  SIMULATION AND TREATMENT PLANNING NOTE    ICD-9-CM ICD-10-CM   1. Breast cancer of upper-inner quadrant of left female breast 174.2 C50.212     DIAGNOSIS:  Breast cancer of upper-inner quadrant of left female breast. Invasive mammary carcinoma, Grade II, ER+ PR(-) HER-2(-) Ki67 20%, pT1c,pN0  NARRATIVE:  The patient was brought to the [facility].  Identity was confirmed.  All relevant records and images related to the planned course of therapy were reviewed.  The patient freely provided informed written consent to proceed with treatment after reviewing the details related to the planned course of therapy. The consent form was witnessed and verified by the simulation staff.  Then, the patient was set-up in a stable reproducible  supine position for radiation therapy.  CT images were obtained.  Surface markings were placed.  The CT images were loaded into the planning software.  Then the target and avoidance structures were contoured.  Treatment planning then occurred.  The radiation prescription was entered and confirmed.  Then, I designed and supervised the construction of a total of 3 medically necessary complex treatment devices.  I have requested : 3D Simulation  I have requested a DVH of the following structures: lump cavity, lungs, heart.  I have ordered:dose calc.  PLAN:  The patient will receive 50.4 Gy in 28 fractions followed by a boost to the lumpectomy cavity of 10 gray and a cumulative dose of 60.4 gray  ________________________________   Special treatment procedure note  Special treatment procedure was performed today due to the extra time and effort required by myself to plan and prepare this patient for deep inspiration breath hold technique.  I have determined cardiac sparing to be of benefit to this patient to  prevent long term cardiac damage due to radiation of the heart.  Bellows were placed on the patient's abdomen. To facilitate cardiac sparing, the patient was coached by the radiation therapists on breath hold techniques and breathing practice was performed. Practice waveforms were obtained. The patient was then scanned while maintaining breath hold in the treatment position.  This image was then transferred over to the imaging specialist. The imaging specialist then created a fusion of the free breathing and breath hold scans using the chest wall as the stable structure. I personally reviewed the fusion in axial, coronal and sagittal image planes.  Excellent cardiac sparing was obtained.  I felt the patient is an appropriate candidate for breath hold and the patient will be treated as such.  The image fusion was then reviewed with the patient to reinforce the necessity of reproducible breath hold. -----------------------------------  Blair Promise, PhD, MD

## 2015-03-11 NOTE — Progress Notes (Signed)
  Radiation Oncology         [phone] ________________________________  Name: Hydie Langan MRN: [id number]  Date: 03/11/2015  DOB: 09/24/1950  Optical Surface Tracking Plan:  Since intensity modulated radiotherapy (IMRT) and 3D conformal radiation treatment methods are predicated on accurate and precise positioning for treatment, intrafraction motion monitoring is medically necessary to ensure accurate and safe treatment delivery.  The ability to quantify intrafraction motion without excessive ionizing radiation dose can only be performed with optical surface tracking. Accordingly, surface imaging offers the opportunity to obtain 3D measurements of patient position throughout IMRT and 3D treatments without excessive radiation exposure.  I am ordering optical surface tracking for this patient's upcoming course of radiotherapy. ________________________________  Gery Pray, MD 03/11/2015 8:00 PM    Reference:   Ursula Alert, J, et al. Surface imaging-based analysis of intrafraction motion for breast radiotherapy patients.Journal of [employer], n. 6, nov. 2014. ISSN [id number].   Available at: <http://www.jacmp.org/index.php/jacmp/article/view/4957>.

## 2015-03-12 ENCOUNTER — Encounter: Payer: Self-pay | Admitting: Oncology

## 2015-03-12 DIAGNOSIS — Z510 Encounter for antineoplastic radiation therapy: Secondary | ICD-10-CM | POA: Diagnosis not present

## 2015-03-12 NOTE — Progress Notes (Signed)
Faxed supporting documents to [employer] in [place] for financial assistance and emailed [employer] application.

## 2015-03-13 ENCOUNTER — Telehealth: Payer: Self-pay | Admitting: Oncology

## 2015-03-13 NOTE — Telephone Encounter (Signed)
Per 9/26 pof moved SCP visit to 1st week in December and per 9/27 pof moved SCP visit to after 11/18 radiation complete. Moved 10/17 SCP visit to 12/6. Left message for patient and mailed schedule.

## 2015-03-18 ENCOUNTER — Ambulatory Visit
Admission: RE | Admit: 2015-03-18 | Discharge: 2015-03-18 | Disposition: A | Discharge status: Home / Self Care | Payer: 59 | Source: Ambulatory Visit | Attending: Radiation Oncology | Admitting: Radiation Oncology

## 2015-03-18 DIAGNOSIS — Z510 Encounter for antineoplastic radiation therapy: Secondary | ICD-10-CM | POA: Diagnosis not present

## 2015-03-19 ENCOUNTER — Ambulatory Visit
Admission: RE | Admit: 2015-03-19 | Discharge: 2015-03-19 | Disposition: A | Discharge status: Home / Self Care | Payer: 59 | Source: Ambulatory Visit | Attending: Radiation Oncology | Admitting: Radiation Oncology

## 2015-03-19 DIAGNOSIS — C50212 Malignant neoplasm of upper-inner quadrant of left female breast: Secondary | ICD-10-CM

## 2015-03-19 DIAGNOSIS — Z510 Encounter for antineoplastic radiation therapy: Secondary | ICD-10-CM | POA: Diagnosis not present

## 2015-03-19 MED ORDER — ALRA NON-METALLIC DEODORANT (RAD-ONC)
1.0000 | Freq: Once | TOPICAL | Status: AC
Start: 2015-03-19 — End: 2015-03-19
  Administered 2015-03-19: 1 via TOPICAL

## 2015-03-19 MED ORDER — RADIAPLEXRX EX GEL
Freq: Once | CUTANEOUS | Status: AC
  Administered 2015-03-19: 17:00:00 via TOPICAL

## 2015-03-19 NOTE — Progress Notes (Signed)
Pt here for patient teaching.  Pt given Radiation and You booklet, skin care instructions, Alra deodorant and Radiaplex gel. Pt reports they have not watched the Radiation Therapy Education video . She has been given a link to watch the video at home per her request.  Reviewed areas of pertinence such as fatigue, skin changes, breast tenderness and breast swelling . Pt able to give teach back of to pat skin and use unscented/gentle soap,apply Radiaplex bid, avoid applying anything to skin within 4 hours of treatment and avoid wearing an under wire bra. Pt demonstrated understanding and verbalizes understanding of information given and will contact nursing with any questions or concerns.

## 2015-03-20 ENCOUNTER — Ambulatory Visit
Admission: RE | Admit: 2015-03-20 | Discharge: 2015-03-20 | Disposition: A | Discharge status: Home / Self Care | Payer: 59 | Source: Ambulatory Visit | Attending: Radiation Oncology | Admitting: Radiation Oncology

## 2015-03-20 DIAGNOSIS — Z510 Encounter for antineoplastic radiation therapy: Secondary | ICD-10-CM | POA: Diagnosis not present

## 2015-03-21 ENCOUNTER — Ambulatory Visit
Admission: RE | Admit: 2015-03-21 | Discharge: 2015-03-21 | Disposition: A | Discharge status: Home / Self Care | Payer: 59 | Source: Ambulatory Visit | Attending: Radiation Oncology | Admitting: Radiation Oncology

## 2015-03-21 DIAGNOSIS — Z510 Encounter for antineoplastic radiation therapy: Secondary | ICD-10-CM | POA: Diagnosis not present

## 2015-03-24 ENCOUNTER — Ambulatory Visit
Admission: RE | Admit: 2015-03-24 | Discharge: 2015-03-24 | Disposition: A | Discharge status: Home / Self Care | Payer: 59 | Source: Ambulatory Visit | Attending: Radiation Oncology | Admitting: Radiation Oncology

## 2015-03-24 DIAGNOSIS — Z510 Encounter for antineoplastic radiation therapy: Secondary | ICD-10-CM | POA: Diagnosis not present

## 2015-03-25 ENCOUNTER — Ambulatory Visit
Admission: RE | Admit: 2015-03-25 | Discharge: 2015-03-25 | Disposition: A | Discharge status: Home / Self Care | Payer: 59 | Source: Ambulatory Visit | Attending: Radiation Oncology | Admitting: Radiation Oncology

## 2015-03-25 ENCOUNTER — Encounter: Payer: Self-pay | Admitting: Radiation Oncology

## 2015-03-25 ENCOUNTER — Telehealth: Payer: Self-pay | Admitting: *Deleted

## 2015-03-25 VITALS — BP 122/75 | HR 103 | Temp 98.5°F | Ht 63.0 in | Wt 150.7 lb

## 2015-03-25 DIAGNOSIS — Z510 Encounter for antineoplastic radiation therapy: Secondary | ICD-10-CM | POA: Diagnosis not present

## 2015-03-25 DIAGNOSIS — C50212 Malignant neoplasm of upper-inner quadrant of left female breast: Secondary | ICD-10-CM

## 2015-03-25 NOTE — Progress Notes (Signed)
  Radiation Oncology         [phone] ________________________________  Name: Marissa Williams MRN: [id number]  Date: 03/25/2015  DOB: 10-20-1950  Weekly Radiation Therapy Management    ICD-9-CM ICD-10-CM   1. Breast cancer of upper-inner quadrant of left female breast (HCC) 174.2 C50.212      Current Dose: 9 Gy     Planned Dose:  60.4 Gy  Narrative . . . . . . . . The patient presents for routine under treatment assessment.                                   The patient is without complaint. here for her 5th fraction of radiation to her Left Breast. She denies any fatigue today, and reports she just started back to work today full time at [employer] Her skin is intact, she does have some darkening underneath her left breast. She is using the radiaplex as directed.                                  Set-up films were reviewed.                                 The chart was checked. Physical Findings. . .  height is 5\' 3"  (1.6 m) and weight is 150 lb 11.2 oz (68.357 kg). Her temperature is 98.5 F (36.9 C). Her blood pressure is 122/75 and her pulse is 103. . Weight essentially stable.  No significant changes. Her skin is intact, she does have some darkening underneath her left breast. The lungs are clear. The heart has a regular rhythm and rate Impression . . . . . . . The patient is tolerating radiation. Plan . . . . . . . . . . . . Continue treatment as planned.  ________________________________   Blair Promise, PhD, MD

## 2015-03-25 NOTE — Progress Notes (Signed)
Ms. Marissa Williams is here for her 5th fraction of radiation to her Left Breast. She denies any fatigue today, and reports she just started back to work today full time at [employer]. Her skin is intact, she does have some darkening underneath her left breast. She is using the radiaplex as directed.  BP 122/75 mmHg  Pulse 103  Temp(Src) 98.5 F (36.9 C)  Ht 5\' 3"  (1.6 m)  Wt 150 lb 11.2 oz (68.357 kg)  BMI 26.70 kg/m2  Wt Readings from Last 3 Encounters:  03/25/15 150 lb 11.2 oz (68.357 kg)  03/06/15 150 lb 4.8 oz (68.176 kg)  02/21/15 151 lb 3.2 oz (68.584 kg)

## 2015-03-25 NOTE — Telephone Encounter (Signed)
Spoke with patient to follow up after start of radiation.  She is doing great.  Discussed survivorship program with her and she already has an appointment for 12/6.  Encouraged her to call with any needs or concerns.

## 2015-03-26 ENCOUNTER — Ambulatory Visit
Admission: RE | Admit: 2015-03-26 | Discharge: 2015-03-26 | Disposition: A | Discharge status: Home / Self Care | Payer: 59 | Source: Ambulatory Visit | Attending: Radiation Oncology | Admitting: Radiation Oncology

## 2015-03-26 DIAGNOSIS — Z510 Encounter for antineoplastic radiation therapy: Secondary | ICD-10-CM | POA: Diagnosis not present

## 2015-03-27 ENCOUNTER — Ambulatory Visit
Admission: RE | Admit: 2015-03-27 | Discharge: 2015-03-27 | Disposition: A | Discharge status: Home / Self Care | Payer: 59 | Source: Ambulatory Visit | Attending: Radiation Oncology | Admitting: Radiation Oncology

## 2015-03-27 DIAGNOSIS — Z510 Encounter for antineoplastic radiation therapy: Secondary | ICD-10-CM | POA: Diagnosis not present

## 2015-03-28 ENCOUNTER — Ambulatory Visit
Admission: RE | Admit: 2015-03-28 | Discharge: 2015-03-28 | Disposition: A | Discharge status: Home / Self Care | Payer: 59 | Source: Ambulatory Visit | Attending: Radiation Oncology | Admitting: Radiation Oncology

## 2015-03-28 DIAGNOSIS — Z510 Encounter for antineoplastic radiation therapy: Secondary | ICD-10-CM | POA: Diagnosis not present

## 2015-03-31 ENCOUNTER — Telehealth: Payer: Self-pay | Admitting: *Deleted

## 2015-03-31 ENCOUNTER — Ambulatory Visit
Admission: RE | Admit: 2015-03-31 | Discharge: 2015-03-31 | Disposition: A | Discharge status: Home / Self Care | Payer: 59 | Source: Ambulatory Visit | Attending: Radiation Oncology | Admitting: Radiation Oncology

## 2015-03-31 ENCOUNTER — Encounter: Payer: 59 | Admitting: Nurse Practitioner

## 2015-03-31 DIAGNOSIS — Z510 Encounter for antineoplastic radiation therapy: Secondary | ICD-10-CM | POA: Diagnosis not present

## 2015-03-31 NOTE — Telephone Encounter (Signed)
Called pt to assess needs. Relate she is doing well and is planning on attending support group.

## 2015-04-01 ENCOUNTER — Ambulatory Visit
Admission: RE | Admit: 2015-04-01 | Discharge: 2015-04-01 | Disposition: A | Discharge status: Home / Self Care | Payer: 59 | Source: Ambulatory Visit | Attending: Radiation Oncology | Admitting: Radiation Oncology

## 2015-04-01 ENCOUNTER — Encounter: Payer: Self-pay | Admitting: Radiation Oncology

## 2015-04-01 VITALS — BP 133/75 | HR 79 | Temp 97.5°F | Ht 63.0 in | Wt 153.2 lb

## 2015-04-01 DIAGNOSIS — Z510 Encounter for antineoplastic radiation therapy: Secondary | ICD-10-CM | POA: Diagnosis not present

## 2015-04-01 DIAGNOSIS — C50212 Malignant neoplasm of upper-inner quadrant of left female breast: Secondary | ICD-10-CM

## 2015-04-01 NOTE — Progress Notes (Signed)
  Radiation Oncology         [phone] ________________________________  Name: Marissa Williams MRN: [id number]  Date: 04/01/2015  DOB: [birth year]  Weekly Radiation Therapy Management    ICD-9-CM ICD-10-CM   1. Breast cancer of upper-inner quadrant of left female breast (HCC) 174.2 C50.212     Current Dose: 18 Gy     Planned Dose:  60.4 Gy  Narrative . . . . . . . . The patient presents for routine under treatment assessment.                                   The patient is without complaint. 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 She worked at some mild fatigue and some tenderness within the breast area                                 Set-up films were reviewed.                                 The chart was checked. Physical Findings. . .  height is 5\' 3"  (1.6 m) and weight is 153 lb 3.2 oz (69.491 kg). Her temperature is 97.5 F (36.4 C). Her blood pressure is 133/75 and her pulse is 79. . Mild Erythema and hyperpigmentation changes noted in the breast. No skin breakdown. Impression . . . . . . . The patient is tolerating radiation. Plan . . . . . . . . . . . . Continue treatment as planned.  ________________________________   Blair Promise, PhD, MD

## 2015-04-01 NOTE — Progress Notes (Signed)
Ms. Marissa Williams is here for her 10th fraction of radiation to her Left breast. 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 Her skin is intact with no abnormalities. She does report tenderness, and throbbing pain occasionally, and states her skin feels "real sensitive" to touch, and clothes. She is using her radiaplex gel twice a day as directed.   BP 133/75 mmHg  Pulse 79  Temp(Src) 97.5 F (36.4 C)  Ht 5\' 3"  (1.6 m)  Wt 153 lb 3.2 oz (69.491 kg)  BMI 27.14 kg/m2   Wt Readings from Last 3 Encounters:  04/01/15 153 lb 3.2 oz (69.491 kg)  03/25/15 150 lb 11.2 oz (68.357 kg)  03/06/15 150 lb 4.8 oz (68.176 kg)

## 2015-04-02 ENCOUNTER — Ambulatory Visit
Admission: RE | Admit: 2015-04-02 | Discharge: 2015-04-02 | Disposition: A | Discharge status: Home / Self Care | Payer: 59 | Source: Ambulatory Visit | Attending: Radiation Oncology | Admitting: Radiation Oncology

## 2015-04-02 DIAGNOSIS — Z510 Encounter for antineoplastic radiation therapy: Secondary | ICD-10-CM | POA: Diagnosis not present

## 2015-04-03 ENCOUNTER — Ambulatory Visit
Admission: RE | Admit: 2015-04-03 | Discharge: 2015-04-03 | Disposition: A | Discharge status: Home / Self Care | Payer: 59 | Source: Ambulatory Visit | Attending: Radiation Oncology | Admitting: Radiation Oncology

## 2015-04-03 DIAGNOSIS — Z510 Encounter for antineoplastic radiation therapy: Secondary | ICD-10-CM | POA: Diagnosis not present

## 2015-04-04 ENCOUNTER — Ambulatory Visit
Admission: RE | Admit: 2015-04-04 | Discharge: 2015-04-04 | Disposition: A | Discharge status: Home / Self Care | Payer: 59 | Source: Ambulatory Visit | Attending: Radiation Oncology | Admitting: Radiation Oncology

## 2015-04-04 DIAGNOSIS — Z510 Encounter for antineoplastic radiation therapy: Secondary | ICD-10-CM | POA: Diagnosis not present

## 2015-04-07 ENCOUNTER — Ambulatory Visit
Admission: RE | Admit: 2015-04-07 | Discharge: 2015-04-07 | Disposition: A | Discharge status: Home / Self Care | Payer: 59 | Source: Ambulatory Visit | Attending: Radiation Oncology | Admitting: Radiation Oncology

## 2015-04-07 DIAGNOSIS — Z510 Encounter for antineoplastic radiation therapy: Secondary | ICD-10-CM | POA: Diagnosis not present

## 2015-04-08 ENCOUNTER — Ambulatory Visit
Admission: RE | Admit: 2015-04-08 | Discharge: 2015-04-08 | Disposition: A | Discharge status: Home / Self Care | Payer: 59 | Source: Ambulatory Visit | Attending: Radiation Oncology | Admitting: Radiation Oncology

## 2015-04-08 ENCOUNTER — Encounter: Payer: Self-pay | Admitting: Radiation Oncology

## 2015-04-08 VITALS — BP 131/78 | HR 73 | Temp 97.6°F | Resp 18 | Ht 63.0 in | Wt 153.7 lb

## 2015-04-08 DIAGNOSIS — Z510 Encounter for antineoplastic radiation therapy: Secondary | ICD-10-CM | POA: Diagnosis not present

## 2015-04-08 DIAGNOSIS — C50212 Malignant neoplasm of upper-inner quadrant of left female breast: Secondary | ICD-10-CM

## 2015-04-08 NOTE — Progress Notes (Signed)
Marissa Williams has completed 15 fractions to her left breast.  She denies pain other than occasional sharp pains in her left breast.  She reports fatigue and continues to work full time.  The skin on her left breast is intact with slight hyperpigmentation under the breast.  She is using radiaplex gel.  BP 131/78 mmHg  Pulse 73  Temp(Src) 97.6 F (36.4 C) (Oral)  Resp 18  Ht 5\' 3"  (1.6 m)  Wt 153 lb 11.2 oz (69.718 kg)  BMI 27.23 kg/m2

## 2015-04-08 NOTE — Progress Notes (Signed)
  Radiation Oncology         (336) [phone] ________________________________  Name: Marissa Williams MRN: [id number]  Date: 04/08/2015  DOB: 1950/10/26  Weekly Radiation Therapy Management    ICD-9-CM ICD-10-CM   1. Breast cancer of upper-inner quadrant of left female breast (HCC) 174.2 C50.212      Current Dose: 27 Gy     Planned Dose:  60.4 Gy  Narrative . . . . . . . . The patient presents for routine under treatment assessment.                                   The patient is without complaint. Occasional sharp pains within the left breast. She continues to work full-time, has noticed some fatigue                                 Set-up films were reviewed.                                 The chart was checked. Physical Findings. . .  height is 5\' 3"  (1.6 m) and weight is 153 lb 11.2 oz (69.718 kg). Her oral temperature is 97.6 F (36.4 C). Her blood pressure is 131/78 and her pulse is 73. Her respiration is 18. . The lungs are clear. The heart has a regular rhythm and rate. The left breast area shows some hyperpigmentation changes but no significant reaction at this point. Impression . . . . . . . The patient is tolerating radiation. Plan . . . . . . . . . . . . Continue treatment as planned.  ________________________________   Blair Promise, PhD, MD

## 2015-04-09 ENCOUNTER — Ambulatory Visit
Admission: RE | Admit: 2015-04-09 | Discharge: 2015-04-09 | Disposition: A | Discharge status: Home / Self Care | Payer: 59 | Source: Ambulatory Visit | Attending: Radiation Oncology | Admitting: Radiation Oncology

## 2015-04-09 DIAGNOSIS — Z510 Encounter for antineoplastic radiation therapy: Secondary | ICD-10-CM | POA: Diagnosis not present

## 2015-04-10 ENCOUNTER — Ambulatory Visit
Admission: RE | Admit: 2015-04-10 | Discharge: 2015-04-10 | Disposition: A | Discharge status: Home / Self Care | Payer: 59 | Source: Ambulatory Visit | Attending: Radiation Oncology | Admitting: Radiation Oncology

## 2015-04-10 DIAGNOSIS — Z510 Encounter for antineoplastic radiation therapy: Secondary | ICD-10-CM | POA: Diagnosis not present

## 2015-04-11 ENCOUNTER — Ambulatory Visit
Admission: RE | Admit: 2015-04-11 | Discharge: 2015-04-11 | Disposition: A | Discharge status: Home / Self Care | Payer: 59 | Source: Ambulatory Visit | Attending: Radiation Oncology | Admitting: Radiation Oncology

## 2015-04-11 DIAGNOSIS — Z510 Encounter for antineoplastic radiation therapy: Secondary | ICD-10-CM | POA: Diagnosis not present

## 2015-04-13 ENCOUNTER — Ambulatory Visit: Admission: RE | Admit: 2015-04-13 | Payer: 59 | Source: Ambulatory Visit

## 2015-04-14 ENCOUNTER — Ambulatory Visit
Admission: RE | Admit: 2015-04-14 | Discharge: 2015-04-14 | Disposition: A | Discharge status: Home / Self Care | Payer: 59 | Source: Ambulatory Visit | Attending: Radiation Oncology | Admitting: Radiation Oncology

## 2015-04-14 ENCOUNTER — Ambulatory Visit: Payer: 59

## 2015-04-14 DIAGNOSIS — Z510 Encounter for antineoplastic radiation therapy: Secondary | ICD-10-CM | POA: Diagnosis not present

## 2015-04-15 ENCOUNTER — Ambulatory Visit
Admission: RE | Admit: 2015-04-15 | Discharge: 2015-04-15 | Disposition: A | Discharge status: Home / Self Care | Payer: 59 | Source: Ambulatory Visit | Attending: Radiation Oncology | Admitting: Radiation Oncology

## 2015-04-15 ENCOUNTER — Encounter: Payer: Self-pay | Admitting: Radiation Oncology

## 2015-04-15 VITALS — BP 120/75 | HR 67 | Temp 98.4°F | Resp 18 | Ht 63.0 in | Wt 153.8 lb

## 2015-04-15 DIAGNOSIS — C50212 Malignant neoplasm of upper-inner quadrant of left female breast: Secondary | ICD-10-CM

## 2015-04-15 DIAGNOSIS — Z510 Encounter for antineoplastic radiation therapy: Secondary | ICD-10-CM | POA: Diagnosis not present

## 2015-04-15 NOTE — Progress Notes (Signed)
  Radiation Oncology         [phone] ________________________________  Name: Marissa Williams MRN: [id number]  Date: 04/15/2015  DOB: 1951/02/02  Weekly Radiation Therapy Management    ICD-9-CM ICD-10-CM   1. Breast cancer of upper-inner quadrant of left female breast (HCC) 174.2 C50.212      Current Dose: 36 Gy     Planned Dose:  60.4 Gy  Narrative . . . . . . . . The patient presents for routine under treatment assessment.                                   The patient has noticed some discomfort in the breast and minimal fatigue. She continues to work full-time at [employer].                                 Set-up films were reviewed.                                 The chart was checked. Physical Findings. . .  height is 5\' 3"  (1.6 m) and weight is 153 lb 12.8 oz (69.763 kg). Her oral temperature is 98.4 F (36.9 C). Her blood pressure is 120/75 and her pulse is 67. Her respiration is 18. . Weight essentially stable.  No significant changes. The lungs are clear. The heart has a regular rhythm and rate. The left breast area shows some hyperpigmentation changes. Patient appears to have a mole in the upper aspect of the breast is darkened some during the course of her treatment. Impression . . . . . . . The patient is tolerating radiation. Plan . . . . . . . . . . . . Continue treatment as planned.  ________________________________   Blair Promise, PhD, MD

## 2015-04-15 NOTE — Progress Notes (Signed)
Marissa Williams has completed 19 fractions to her left breast.  She denies pain.  She reports having fatigue.  She is working full time.  She noticed a small scabbed area on her left breast this morning.  The skin on her left breast has hyperpigmentation.  She is using radiaplex.  BP 120/75 mmHg  Pulse 67  Temp(Src) 98.4 F (36.9 C) (Oral)  Resp 18  Ht 5\' 3"  (1.6 m)  Wt 153 lb 12.8 oz (69.763 kg)  BMI 27.25 kg/m2

## 2015-04-16 ENCOUNTER — Ambulatory Visit
Admission: RE | Admit: 2015-04-16 | Discharge: 2015-04-16 | Disposition: A | Discharge status: Home / Self Care | Payer: 59 | Source: Ambulatory Visit | Attending: Radiation Oncology | Admitting: Radiation Oncology

## 2015-04-16 DIAGNOSIS — Z510 Encounter for antineoplastic radiation therapy: Secondary | ICD-10-CM | POA: Diagnosis not present

## 2015-04-17 ENCOUNTER — Ambulatory Visit
Admission: RE | Admit: 2015-04-17 | Discharge: 2015-04-17 | Disposition: A | Discharge status: Home / Self Care | Payer: 59 | Source: Ambulatory Visit | Attending: Radiation Oncology | Admitting: Radiation Oncology

## 2015-04-17 DIAGNOSIS — Z510 Encounter for antineoplastic radiation therapy: Secondary | ICD-10-CM | POA: Diagnosis not present

## 2015-04-18 ENCOUNTER — Ambulatory Visit
Admission: RE | Admit: 2015-04-18 | Discharge: 2015-04-18 | Disposition: A | Discharge status: Home / Self Care | Payer: 59 | Source: Ambulatory Visit | Attending: Radiation Oncology | Admitting: Radiation Oncology

## 2015-04-18 DIAGNOSIS — Z510 Encounter for antineoplastic radiation therapy: Secondary | ICD-10-CM | POA: Diagnosis not present

## 2015-04-21 ENCOUNTER — Ambulatory Visit
Admission: RE | Admit: 2015-04-21 | Discharge: 2015-04-21 | Disposition: A | Discharge status: Home / Self Care | Payer: 59 | Source: Ambulatory Visit | Attending: Radiation Oncology | Admitting: Radiation Oncology

## 2015-04-21 DIAGNOSIS — Z510 Encounter for antineoplastic radiation therapy: Secondary | ICD-10-CM | POA: Diagnosis not present

## 2015-04-22 ENCOUNTER — Ambulatory Visit
Admission: RE | Admit: 2015-04-22 | Discharge: 2015-04-22 | Disposition: A | Discharge status: Home / Self Care | Payer: 59 | Source: Ambulatory Visit | Attending: Radiation Oncology | Admitting: Radiation Oncology

## 2015-04-22 ENCOUNTER — Encounter: Payer: Self-pay | Admitting: Radiation Oncology

## 2015-04-22 VITALS — BP 133/75 | HR 82 | Temp 97.7°F | Ht 63.0 in | Wt 151.2 lb

## 2015-04-22 DIAGNOSIS — C50212 Malignant neoplasm of upper-inner quadrant of left female breast: Secondary | ICD-10-CM

## 2015-04-22 DIAGNOSIS — Z510 Encounter for antineoplastic radiation therapy: Secondary | ICD-10-CM | POA: Diagnosis not present

## 2015-04-22 NOTE — Progress Notes (Signed)
Marissa Williams presents for her 25th fraction of radiation to her Left Breast. She reports some mild fatigue, but relates this to being back at work full-time. Her Left breast has mild redness, but she denies any pain. She has hyperpigmentation under her Left Axilla and she reports mild pain when she moves her arm. She also has hyperpigmentation under her breast. She denies any other problems at this time.   BP 133/75 mmHg  Pulse 82  Temp(Src) 97.7 F (36.5 C)  Ht 5\' 3"  (1.6 m)  Wt 151 lb 3.2 oz (68.584 kg)  BMI 26.79 kg/m2

## 2015-04-22 NOTE — Progress Notes (Signed)
  Radiation Oncology         [phone] ________________________________  Name: Marissa Williams MRN: [id number]  Date: 04/22/2015  DOB: 19-Oct-1950  Weekly Radiation Therapy Management    ICD-9-CM ICD-10-CM   1. Breast cancer of upper-inner quadrant of left female breast (HCC) 174.2 C50.212      Current Dose: 45 Gy     Planned Dose:  60.4 Gy  Narrative . . . . . . . . The patient presents for routine under treatment assessment.                                   The patient is without complaint. She continues to work her usual schedule at [facility] hospital. He denies any itching or discomfort within the breast.                                 Set-up films were reviewed.                                 The chart was checked. Physical Findings. . .  height is 5\' 3"  (1.6 m) and weight is 151 lb 3.2 oz (68.584 kg). Her temperature is 97.7 F (36.5 C). Her blood pressure is 133/75 and her pulse is 82. . The left breast area shows some hyperpigmentation changes. No skin breakdown is appreciated. Impression . . . . . . . The patient is tolerating radiation. Plan . . . . . . . . . . . . Continue treatment as planned.  ________________________________   Blair Promise, PhD, MD

## 2015-04-23 ENCOUNTER — Encounter: Payer: Self-pay | Admitting: Radiation Oncology

## 2015-04-23 ENCOUNTER — Ambulatory Visit: Admission: RE | Admit: 2015-04-23 | Payer: 59 | Source: Ambulatory Visit

## 2015-04-23 DIAGNOSIS — Z510 Encounter for antineoplastic radiation therapy: Secondary | ICD-10-CM | POA: Diagnosis not present

## 2015-04-23 NOTE — Progress Notes (Signed)
  Radiation Oncology         [phone]) [phone] ________________________________  Name: Marissa Williams MRN: [id number]  Date: 04/23/2015  DOB: 1950-07-26  Electron beam simulation  Note   Status: outpatient  NARRATIVE: Earlier today the patient underwent additional planning for radiation therapy directed at the left breast. The patient's treatment planning CT scan was reviewed and she had set up of a custom electron cutout field directed at the lumpectomy cavity within the left breast. Patient will be treated with 12 megavoltage electrons. A monte carlo isodose plan was generated for treatment. A special port plan is requested for treatment. The patient will receive 5 additional treatments at 2 gray per fraction for a boost dose of 10 gray.  -----------------------------------  Blair Promise, PhD, MD

## 2015-04-24 ENCOUNTER — Ambulatory Visit
Admission: RE | Admit: 2015-04-24 | Discharge: 2015-04-24 | Disposition: A | Discharge status: Home / Self Care | Payer: 59 | Source: Ambulatory Visit | Attending: Radiation Oncology | Admitting: Radiation Oncology

## 2015-04-24 DIAGNOSIS — Z510 Encounter for antineoplastic radiation therapy: Secondary | ICD-10-CM | POA: Diagnosis not present

## 2015-04-25 ENCOUNTER — Encounter: Payer: Self-pay | Admitting: Radiation Oncology

## 2015-04-25 ENCOUNTER — Ambulatory Visit
Admission: RE | Admit: 2015-04-25 | Discharge: 2015-04-25 | Disposition: A | Discharge status: Home / Self Care | Payer: 59 | Source: Ambulatory Visit | Attending: Radiation Oncology | Admitting: Radiation Oncology

## 2015-04-25 DIAGNOSIS — Z510 Encounter for antineoplastic radiation therapy: Secondary | ICD-10-CM | POA: Diagnosis not present

## 2015-04-25 NOTE — Progress Notes (Signed)
Paperwork received from doctor and faxed to [facility] @ [phone], confirmation received, 04/25/15 Ardeen Fillers)

## 2015-04-27 ENCOUNTER — Encounter (HOSPITAL_COMMUNITY): Payer: Self-pay | Admitting: Emergency Medicine

## 2015-04-27 ENCOUNTER — Emergency Department (HOSPITAL_COMMUNITY)
Admission: EM | Admit: 2015-04-27 | Discharge: 2015-04-27 | Disposition: A | Discharge status: Home / Self Care | Payer: 59 | Attending: Emergency Medicine | Admitting: Emergency Medicine

## 2015-04-27 DIAGNOSIS — S41112A Laceration without foreign body of left upper arm, initial encounter: Secondary | ICD-10-CM

## 2015-04-27 DIAGNOSIS — Y998 Other external cause status: Secondary | ICD-10-CM | POA: Diagnosis not present

## 2015-04-27 DIAGNOSIS — Z853 Personal history of malignant neoplasm of breast: Secondary | ICD-10-CM | POA: Diagnosis not present

## 2015-04-27 DIAGNOSIS — S41102A Unspecified open wound of left upper arm, initial encounter: Secondary | ICD-10-CM | POA: Insufficient documentation

## 2015-04-27 DIAGNOSIS — Z79899 Other long term (current) drug therapy: Secondary | ICD-10-CM | POA: Insufficient documentation

## 2015-04-27 DIAGNOSIS — X58XXXA Exposure to other specified factors, initial encounter: Secondary | ICD-10-CM | POA: Insufficient documentation

## 2015-04-27 DIAGNOSIS — S4992XA Unspecified injury of left shoulder and upper arm, initial encounter: Secondary | ICD-10-CM | POA: Diagnosis present

## 2015-04-27 DIAGNOSIS — I10 Essential (primary) hypertension: Secondary | ICD-10-CM | POA: Insufficient documentation

## 2015-04-27 DIAGNOSIS — Y9289 Other specified places as the place of occurrence of the external cause: Secondary | ICD-10-CM | POA: Diagnosis not present

## 2015-04-27 DIAGNOSIS — Y9389 Activity, other specified: Secondary | ICD-10-CM | POA: Insufficient documentation

## 2015-04-27 MED ORDER — TRIPLE ANTIBIOTIC 5-400-5000 EX OINT: TOPICAL_OINTMENT | Freq: Four times a day (QID) | CUTANEOUS | Status: DC

## 2015-04-27 NOTE — ED Notes (Addendum)
Pt is stage 1 Breast Cancer and is currently under radiation treatment. Pt had a spot show up under her arm and covered it with a bandaid. Today when she took the bandaid off she took some of her skin off. Area red in color and painful. Pt also put some kind of OTC cream on area.

## 2015-04-27 NOTE — Discharge Instructions (Signed)
We saw you in the ER for your WOUND. Please read the instructions provided on wound care. Keep the area clean and dry, apply bacitracin ointment daily and take the medications provided. RETURN TO THE ER IF THERE IS INCREASED PAIN, REDNESS, PUS COMING OUT from the wound site.   Wound Care Taking care of your wound properly can help to prevent pain and infection. It can also help your wound to heal more quickly.  HOW TO CARE FOR YOUR WOUND  Take or apply over-the-counter and prescription medicines only as told by your health care provider.  If you were prescribed antibiotic medicine, take or apply it as told by your health care provider. Do not stop using the antibiotic even if your condition improves.  Clean the wound each day or as told by your health care provider.  Wash the wound with mild soap and water.  Rinse the wound with water to remove all soap.  Pat the wound dry with a clean towel. Do not rub it.  There are many different ways to close and cover a wound. For example, a wound can be covered with stitches (sutures), skin glue, or adhesive strips. Follow instructions from your health care provider about:  How to take care of your wound.  When and how you should change your bandage (dressing).  When you should remove your dressing.  Removing whatever was used to close your wound.  Check your wound every day for signs of infection. Watch for:  Redness, swelling, or pain.  Fluid, blood, or pus.  Keep the dressing dry until your health care provider says it can be removed. Do not take baths, swim, use a hot tub, or do anything that would put your wound underwater until your health care provider approves.  Raise (elevate) the injured area above the level of your heart while you are sitting or lying down.  Do not scratch or pick at the wound.  Keep all follow-up visits as told by your health care provider. This is important. SEEK MEDICAL CARE IF:  You received a  tetanus shot and you have swelling, severe pain, redness, or bleeding at the injection site.  You have a fever.  Your pain is not controlled with medicine.  You have increased redness, swelling, or pain at the site of your wound.  You have fluid, blood, or pus coming from your wound.  You notice a bad smell coming from your wound or your dressing. SEEK IMMEDIATE MEDICAL CARE IF:  You have a red streak going away from your wound.   This information is not intended to replace advice given to you by your health care provider. Make sure you discuss any questions you have with your health care provider.   Document Released: 03/09/2008 Document Revised: 10/15/2014 Document Reviewed: 05/27/2014 Elsevier Interactive Patient Education [employer].

## 2015-04-27 NOTE — ED Provider Notes (Signed)
CSN: [id number]     Arrival date & time 04/27/15  1832 History   First MD Initiated Contact with Patient 04/27/15 2017     Chief Complaint  Patient presents with  . Open Wound     (Consider location/radiation/quality/duration/timing/severity/associated sxs/prior Treatment) HPI Comments: 64 year old female with history significant for breast cancer (currently receiving radiation) and HTN who presents with complaint of left axillary wound.  Of note, the patient is currently recieving radiation therapy for left breast cancer. Some portions of radiation therapy involve administration under the patient's left arm. This week will begin her last week of radiation therapy, and she notes that radiation will not be administered under her left arm.   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 She applied antibiotic ointment and bandage to the wound. Upon removing bandage, the wound size grew. She desires that the wound be dressed appropriately. She denies fever, chills, or drainage from the wound site.     The history is provided by the patient.    Past Medical History  Diagnosis Date  . Hypertension Dx 2008  . Breast cancer of upper-inner quadrant of left female breast ([employer] [facility]) 01/27/2015   Past Surgical History  Procedure Laterality Date  . Tubal ligation  1970  . Abdominal hysterectomy  1980    uterine prolapse   . Breast lumpectomy with radioactive seed and sentinel lymph node biopsy Left 02/06/2015    Procedure: LEFT BREAST LUMPECTOMY WITH RADIOACTIVE SEED AND LEFT AXILLARY SENTINEL LYMPH NODE BIOPSY;  Surgeon: Excell Seltzer, MD;  Location: [facility];  Service: General;  Laterality: Left;   Family History  Problem Relation Age of Onset  . Alzheimer's disease Father   . Cancer Mother     throat cancer- smoker and ETOH   . Heart disease Neg Hx   . Diabetes Neg Hx    Social History  Substance Use  Topics  . Smoking status: Never Smoker   . Smokeless tobacco: Never Used  . Alcohol Use: Yes     Comment: occas   OB History    Gravida Para Term Preterm AB TAB SAB Ectopic Multiple Living   4 4 4       4      Review of Systems  Constitutional: Positive for activity change.  Skin: Positive for rash and wound.  Allergic/Immunologic: Negative for immunocompromised state.      Allergies  Review of patient's allergies indicates no known allergies.  Home Medications   Prior to Admission medications   Medication Sig Start Date End Date Taking? Authorizing Provider  hydrochlorothiazide (HYDRODIURIL) 25 MG tablet Take 1 tablet (25 mg total) by mouth daily. 04/16/14  Yes Josalyn Funches, MD  anastrozole (ARIMIDEX) 1 MG tablet Take 1 tablet (1 mg total) by mouth daily. Patient not taking: Reported on 03/25/2015 02/25/15   Chauncey Cruel, MD  hyaluronate sodium (RADIAPLEXRX) GEL Apply 1 application topically 2 (two) times daily.    Historical Provider, MD  neomycin-bacitracin-polymyxin (NEOSPORIN) [phone] ointment Apply topically 4 (four) times daily. 04/27/15   Kaleb Sek, MD   BP 160/97 mmHg  Pulse 74  Temp(Src) 98.4 F (36.9 C) (Oral)  Resp 16  SpO2 100% Physical Exam  Constitutional: She is oriented to person, place, and time. She appears well-developed.  HENT:  Head: Normocephalic and atraumatic.  Eyes: EOM are normal.  Neck: Normal range of motion. Neck supple.  Cardiovascular: Normal rate.  Pulmonary/Chest: Effort normal.  Abdominal: Bowel sounds are normal.  Neurological: She is alert and oriented to person, place, and time.  Skin: Skin is warm and dry.  L axilla has skin tear, no signs of infection - no redness, drainage.  Nursing note and vitals reviewed.   ED Course  Procedures (including critical care time) Labs Review Labs Reviewed - No data to display  Imaging Review No results found. I have personally reviewed and evaluated these images and lab  results as part of my medical decision-making.   EKG Interpretation None      MDM   Final diagnoses:  Skin tear of left upper arm without complication, initial encounter    Pt with axillary skin tear. Will tx with neosporin and gauze. Advised caution with the use of the arm to allow for swift healing.   Varney Biles, MD 04/27/15 [phone]

## 2015-04-28 ENCOUNTER — Ambulatory Visit
Admission: RE | Admit: 2015-04-28 | Discharge: 2015-04-28 | Disposition: A | Discharge status: Home / Self Care | Payer: 59 | Source: Ambulatory Visit | Attending: Radiation Oncology | Admitting: Radiation Oncology

## 2015-04-28 ENCOUNTER — Telehealth: Payer: Self-pay | Admitting: Oncology

## 2015-04-28 DIAGNOSIS — Z510 Encounter for antineoplastic radiation therapy: Secondary | ICD-10-CM | POA: Diagnosis not present

## 2015-04-28 NOTE — Progress Notes (Signed)
Marissa Williams came to the clinic before her treatment time.  She had a dressing placed by the ER on Sunday.  The dressing was removed and she has one large area of desquamation under her left arm as well as several small areas of desquamation.  She was advised to put neosporin on the open areas and to only use non stick telfa pads.  She was given extra telfa pads to use at home.

## 2015-04-28 NOTE — Telephone Encounter (Signed)
Marissa Williams called and said that she noticed a small open area in her skin under her left arm on Sunday.  She had to go to work so she covered it with a band-aid.  When she took the band-aid off, it took more skin with it.  She went to the ER on Sunday night and they applied antibiotic ointment to the area and covered it with a dressing.  She would like it to be looked at today before treatment.  Advised her to come in at 4:00 today.  Marissa Williams verbalized agreement.

## 2015-04-29 ENCOUNTER — Ambulatory Visit
Admission: RE | Admit: 2015-04-29 | Discharge: 2015-04-29 | Disposition: A | Discharge status: Home / Self Care | Payer: 59 | Source: Ambulatory Visit | Attending: Radiation Oncology | Admitting: Radiation Oncology

## 2015-04-29 ENCOUNTER — Encounter: Payer: Self-pay | Admitting: Radiation Oncology

## 2015-04-29 ENCOUNTER — Ambulatory Visit: Payer: 59

## 2015-04-29 VITALS — BP 126/83 | HR 79 | Temp 98.3°F | Ht 63.0 in | Wt 151.2 lb

## 2015-04-29 DIAGNOSIS — Z510 Encounter for antineoplastic radiation therapy: Secondary | ICD-10-CM | POA: Diagnosis not present

## 2015-04-29 DIAGNOSIS — C50212 Malignant neoplasm of upper-inner quadrant of left female breast: Secondary | ICD-10-CM

## 2015-04-29 NOTE — Progress Notes (Signed)
Painted patient's left underarm area and under her left breast with gentian violet.

## 2015-04-29 NOTE — Progress Notes (Signed)
  Radiation Oncology         [phone] ________________________________  Name: Marissa Williams MRN: [id number]  Date: 04/29/2015  DOB: Mar 19, 1951  Weekly Radiation Therapy Management    ICD-9-CM ICD-10-CM   1. Breast cancer of upper-inner quadrant of left female breast (HCC) 174.2 C50.212      Current Dose: 54.4 Gy     Planned Dose:  60.4 Gy  Narrative . . . . . . . . The patient presents for routine under treatment assessment.                                   The patient has had a lot of discomfort in the inframammary fold and upper outer aspect of the breast. She presented to the emergency room Sunday evening light of her skin irritation. She however has continued to work her usual schedule.                                 Set-up films were reviewed.                                 The chart was checked. Physical Findings. . .  height is 5\' 3"  (1.6 m) and weight is 151 lb 3.2 oz (68.584 kg). Her oral temperature is 98.3 F (36.8 C). Her blood pressure is 126/83 and her pulse is 79. . The lungs are clear. The heart has regular rhythm and rate. Examination of left breast reveals hyperpigmentation changes erythema. She also has moist desquamation in the lateral upper aspect of the breast and inframammary fold area. Impression . . . . . . . The patient is tolerating radiation. Plan . . . . . . . . . . . . Continue treatment as planned. The left under arm area and inframammary fold area was treated today with gentian violet. Patient will be seen again in a couple of days for further evaluation.  ________________________________   Blair Promise, PhD, MD

## 2015-04-29 NOTE — Progress Notes (Signed)
Marissa Williams has completed 30 fractions to her left breast.  She reports pain in her left nipple area and underarm at a 5/10.  She reports fatigue.  She has an area of desquamation under her left arm and a small area under her left breast.  She is using neosporin on the open areas and radiaplex on her left breast.    BP 126/83 mmHg  Pulse 79  Temp(Src) 98.3 F (36.8 C) (Oral)  Ht 5\' 3"  (1.6 m)  Wt 151 lb 3.2 oz (68.584 kg)  BMI 26.79 kg/m2

## 2015-04-30 ENCOUNTER — Ambulatory Visit: Payer: 59

## 2015-04-30 DIAGNOSIS — Z510 Encounter for antineoplastic radiation therapy: Secondary | ICD-10-CM | POA: Diagnosis not present

## 2015-05-01 ENCOUNTER — Ambulatory Visit
Admission: RE | Admit: 2015-05-01 | Discharge: 2015-05-01 | Disposition: A | Discharge status: Home / Self Care | Payer: 59 | Source: Ambulatory Visit | Attending: Radiation Oncology | Admitting: Radiation Oncology

## 2015-05-01 DIAGNOSIS — Z510 Encounter for antineoplastic radiation therapy: Secondary | ICD-10-CM | POA: Diagnosis not present

## 2015-05-01 DIAGNOSIS — C50212 Malignant neoplasm of upper-inner quadrant of left female breast: Secondary | ICD-10-CM

## 2015-05-01 MED ORDER — SILVER SULFADIAZINE 1 % EX CREA
TOPICAL_CREAM | Freq: Every day | CUTANEOUS | Status: DC
  Administered 2015-05-01: 18:00:00 via TOPICAL

## 2015-05-01 NOTE — Progress Notes (Signed)
  Radiation Oncology         [phone] ________________________________  Name: Marissa Williams MRN: [id number]  Date: 05/01/2015  DOB: 1951/05/13  Weekly Radiation Therapy Management    ICD-9-CM ICD-10-CM   1. Breast cancer of upper-inner quadrant of left female breast (HCC) 174.2 C50.212 silver sulfADIAZINE (SILVADENE) 1 % cream     Current Dose: 58.4 Gy     Planned Dose:  60.4 Gy  Narrative . . . . . . . . The patient presents for routine under treatment assessment.                                   The patient is seen today for weekly assessment. She has one more treatment remaining directed at the left breast. She is having a lot of discomfort in the inframammary fold and axillary region but has continued to work despite her skin reaction. Patient did have gentian violet placed along these areas earlier in the week.                                 Set-up films were reviewed.                                 The chart was checked. Physical Findings. . .  The lungs are clear. The heart has a regular rhythm and rate. The left breast area shows hyperpigmentation changes and dry desquamation. Healing moist desquamation is noted in the inframammary fold and left axillary region. No signs of infection. Impression . . . . . . . The patient is tolerating radiation. Plan . . . . . . . . . . . . Continue treatment as planned. Patient is been given Silvadene to place on her areas of skin breakdown. She will return next week for skin check and will complete her treatments tomorrow.  ________________________________   Blair Promise, PhD, MD

## 2015-05-01 NOTE — Progress Notes (Signed)
Weekly rd txs 32/33 completed moist desquamation since this past Saturday  under axilla and under inframmary fold, using neosporin there and  radiaplex elsewhere on breast, pain 5/10"stinging stated patient,  Appetite okay, fatigued BP 128/72 mmHg  Pulse 75  Temp(Src) 98.2 F (36.8 C) (Oral)  Resp 20  Wt Readings from Last 3 Encounters:  04/29/15 151 lb 3.2 oz (68.584 kg)  04/22/15 151 lb 3.2 oz (68.584 kg)  04/15/15 153 lb 12.8 oz (69.763 kg)   .

## 2015-05-02 ENCOUNTER — Encounter: Payer: Self-pay | Admitting: Radiation Oncology

## 2015-05-02 ENCOUNTER — Ambulatory Visit
Admission: RE | Admit: 2015-05-02 | Discharge: 2015-05-02 | Disposition: A | Discharge status: Home / Self Care | Payer: 59 | Source: Ambulatory Visit | Attending: Radiation Oncology | Admitting: Radiation Oncology

## 2015-05-02 DIAGNOSIS — Z510 Encounter for antineoplastic radiation therapy: Secondary | ICD-10-CM | POA: Diagnosis not present

## 2015-05-05 ENCOUNTER — Telehealth: Payer: Self-pay | Admitting: *Deleted

## 2015-05-05 ENCOUNTER — Encounter: Payer: Self-pay | Admitting: Oncology

## 2015-05-05 ENCOUNTER — Telehealth: Payer: Self-pay | Admitting: Oncology

## 2015-05-05 NOTE — Telephone Encounter (Signed)
Spoke with patient to follow up after completion of radiation therapy.  She states she is doing well with no complaints.  Encouraged her to call with any needs or concerns.

## 2015-05-05 NOTE — Telephone Encounter (Addendum)
Called Marissa Williams and advised her that a follow up appointment with Dr. Sondra Come has been made for 05/07/15 at 4:30 pm.  Vitoria verbalized agreement and understanding.

## 2015-05-07 ENCOUNTER — Encounter: Payer: Self-pay | Admitting: Radiation Oncology

## 2015-05-07 ENCOUNTER — Ambulatory Visit
Admission: RE | Admit: 2015-05-07 | Discharge: 2015-05-07 | Disposition: A | Discharge status: Home / Self Care | Payer: 59 | Source: Ambulatory Visit | Attending: Radiation Oncology | Admitting: Radiation Oncology

## 2015-05-07 ENCOUNTER — Other Ambulatory Visit: Payer: Self-pay | Admitting: Adult Health

## 2015-05-07 VITALS — BP 136/92 | HR 82 | Temp 97.8°F | Ht 63.0 in | Wt 151.9 lb

## 2015-05-07 DIAGNOSIS — C50212 Malignant neoplasm of upper-inner quadrant of left female breast: Secondary | ICD-10-CM

## 2015-05-07 NOTE — Progress Notes (Signed)
Marissa Williams is here for follow-up of radiation to her Left Breast completed 05/02/15. She reports "soreness when she raise her Left arm". Her skin is improved, she has a nickel size open area to her axilla area that silvadene to twice a day. The area surrounding that is healed without any open areas present. Under her breast she has an area of pink skin with dry skin visible. There are no open areas visible under her breast. I will supply her with several non-stick pads to use with silvadene for when she is working.   BP 136/92 mmHg  Pulse 82  Temp(Src) 97.8 F (36.6 C)  Ht 5\' 3"  (1.6 m)  Wt 151 lb 14.4 oz (68.901 kg)  BMI 26.91 kg/m2

## 2015-05-07 NOTE — Progress Notes (Signed)
  Radiation Oncology         [phone] ________________________________  Name: Marissa Williams MRN: [id number]  Date: 05/07/2015  DOB: [birth year]  Follow-Up Visit Note  CC: Minerva Ends, MD  Magrinat, Virgie Dad, MD   Diagnosis: Invasive mammary carcinoma of the left breast, Grade II, ER+ PR(-) HER-2(-), Ki67 20%, ( pT1c, pN0)  Interval Since Last Radiation:  5  days  Narrative:  The patient returns today for close follow-up. She developed moist desquamation in the left breast area. She has been using Silvadene. Overall her discomfort has improved over the past few days. She denies any chills or fever.                             ALLERGIES:  has No Known Allergies.  Meds: Current Outpatient Prescriptions  Medication Sig Dispense Refill  . anastrozole (ARIMIDEX) 1 MG tablet Take 1 tablet (1 mg total) by mouth daily. 90 tablet 4  . hyaluronate sodium (RADIAPLEXRX) GEL Apply 1 application topically 2 (two) times daily.    . hydrochlorothiazide (HYDRODIURIL) 25 MG tablet Take 1 tablet (25 mg total) by mouth daily. 90 tablet 3  . neomycin-bacitracin-polymyxin (NEOSPORIN) [phone] ointment Apply topically 4 (four) times daily. 28.3 g 0  . silver sulfADIAZINE (SILVADENE) 1 % cream Apply 1 application topically 2 (two) times daily.     No current facility-administered medications for this encounter.    Physical Findings: The patient is in no acute distress. Patient is alert and oriented.  height is _0  (1.6 m) and weight is 151 lb 14.4 oz (68.901 kg). Her temperature is 97.8 F (36.6 C). Her blood pressure is 136/92 and her pulse is 82. . The lungs are clear. The heart has a regular rhythm and rate. The left breast area shows healing moist desquamation in the lateral aspect of the breast. No signs of infection. The remainder of the breast shows hyperpigmentation changes and dry desquamation. No further moist desquamation in the inframammary fold.  Lab Findings: Lab Results    Component Value Date   WBC 9.1 03/04/2015   HGB 13.3 03/04/2015   HCT 38.0 03/04/2015   MCV 81.5 03/04/2015   PLT 312 03/04/2015    Radiographic Findings: No results found.  Impression:  The patient is recovering from the effects of radiation.    Plan:  Routine follow-up in one month. ____________________________________  Blair Promise, PhD, MD  This document serves as a record of services personally performed by Gery Pray, MD. It was created on his behalf by Darcus Austin, a trained medical scribe. The creation of this record is based on the scribe's personal observations and the provider's statements to them. This document has been checked and approved by the attending provider.

## 2015-05-11 NOTE — Progress Notes (Signed)
Radiation Oncology         [phone] °________________________________ ° °Name: Marissa Williams MRN: [id number]  °Date: 05/02/2015  DOB: 04/26/1951 ° °End of Treatment Note ° °Diagnosis:    ICD-9-CM  ICD-10-CM      °1.  Breast cancer of upper-inner quadrant of left female breast  174.2  C50.212     ° ° °DIAGNOSIS:  Breast cancer of upper-inner quadrant of left female breast. Invasive mammary carcinoma,  Grade II, ER+ PR(-) HER-2(-) Ki67 20%, pT1c,pN0  °  ° °Indication for treatment:  Breast conservation therapy      ° °Radiation treatment dates:   03/19/2015-05/02/2015 ° °Site/dose:   Left breast 50.4 Gy in 28 fx's; lumpectomy cavity boost 10 Gy in 5 fx's ° °Beams/energy:   3-D conformal with tangential beams for initial setup: °                              Lumpectomy cavity boost 12 MeV electrons, custom electron cutout ° °Narrative: The patient tolerated radiation treatment relatively well.   She continued to work full time thru out most of her treatment.  She did develop moist desq. In axillary region and infram. Fold treated with silvadene ° °Plan: The patient has completed radiation treatment. The patient will return to radiation oncology clinic for routine followup in one week. I advised them to call or return sooner if they have any questions or concerns related to their recovery or treatment. ° °----------------------------------- ° °James D. Kinard, PhD, MD ° °

## 2015-05-20 ENCOUNTER — Telehealth: Payer: Self-pay | Admitting: Oncology

## 2015-05-20 ENCOUNTER — Encounter: Payer: Self-pay | Admitting: Nurse Practitioner

## 2015-05-20 ENCOUNTER — Ambulatory Visit (HOSPITAL_BASED_OUTPATIENT_CLINIC_OR_DEPARTMENT_OTHER): Payer: 59 | Admitting: Nurse Practitioner

## 2015-05-20 VITALS — BP 134/74 | HR 84 | Temp 97.5°F | Resp 18 | Ht 63.0 in | Wt 153.8 lb

## 2015-05-20 DIAGNOSIS — Z170 Estrogen receptor positive status [ER+]: Secondary | ICD-10-CM

## 2015-05-20 DIAGNOSIS — C50212 Malignant neoplasm of upper-inner quadrant of left female breast: Secondary | ICD-10-CM

## 2015-05-20 NOTE — Progress Notes (Signed)
CLINIC:  Cancer Survivorship   REASON FOR VISIT:  Routine follow-up post-treatment for a recent history of breast cancer.  BRIEF ONCOLOGIC HISTORY:    Breast cancer of upper-inner quadrant of left female breast ([place])   01/21/2015 Breast US Left breast: irregular hypoechoic mass with angular margins and posterior acoustic shadowing that measures 6 x 7 x 9 mm.   01/22/2015 Initial Biopsy Breast, left, needle core biopsy, 10:30 o'clock, 8 to 9 CMFN: Invasive mammary carcinoma, ER+ (100%), PR- (0%), HER2/neu negative (ratio 1.48), Ki67 20%   01/22/2015 Clinical Stage Stage IA: T1b N0   02/06/2015 Definitive Surgery Left lumpectomy/SLNB ([facility]): Invasive ductal carcinoma, grade 2, + LVI/perineural invasion, HER2/neu repeated and remains negative (ratio 1.22). 2 LNs removed and negative for malignancy (0/2)   02/06/2015 Pathologic Stage Stage IA; pT1c pN0   02/06/2015 Oncotype testing RS 29 (19% ROR)   02/25/2015 -  Chemotherapy Declined chemotherapy   02/25/2015 -  Anti-estrogen oral therapy Anastrozole 1 mg began; held during radiation therapy (10/5-11/18/16); resumed following completion of therapy   03/19/2015 - 05/02/2015 Radiation Therapy Adjuvant RT ([facility]): Left breast 50.4 Gy over 28 fractions; left lumpectomy cavity boost 10 Gy over 5 fractions.  Total dose 60.4 Gy    INTERVAL HISTORY:  Ms. Patch presents to the [facility] today for our initial meeting to review her survivorship care plan detailing her treatment course for breast cancer, as well as monitoring long-term side effects of that treatment, education regarding health maintenance, screening, and overall wellness and health promotion.     Overall, [name] reports feeling quite well since completing her radiation therapy last month.  She continues with intermittent fatigue but is still able to work.  The skin over her left breast remains darkened but is intact.  She denies headache, cough, shortness of breath or bone pain.   She has a good appetite and denies any weight loss.  [name] denies any mass or nodule within her breast but has noticed some thickening in her left breast following radiation.  She held her anastrozole throughout her radiation therapy, but resumed it at the completion.  She has some difficulty sleeping but she attributes this to the timing of when she goes to bed (early in the evening after completing a day at work).  She does have hot flashes related to the anastrozole, but she states they are bearable.  She also has some vaginal dryness and is currently not using anything for that.  REVIEW OF SYSTEMS:  General: Mild hot flashes as above. Denies fever, chills,or night sweats.  HEENT: Wears glasses. Denies visual changes, hearing loss, mouth sores or difficulty swallowing. Cardiac: Denies palpitations, chest pain, and lower extremity edema.  Respiratory: Denies wheeze or dyspnea on exertion.  Breast: Post radiation changes as above in left breast.  Otherwise, denies any new nodularity, masses, tenderness, nipple changes, or nipple discharge.  GI: Denies abdominal pain, constipation, diarrhea, nausea, or vomiting.  GU: Vaginal dryness as above, otherwise denies dysuria, hematuria, vaginal bleeding, or vaginal discharge. Musculoskeletal: Denies joint or bone pain.  Neuro: Denies recent fall or numbness / tingling in her extremities. Skin: Denies rash, pruritis, or open wounds.  Psych: Denies depression, anxiety, insomnia, or memory loss.   A 14-point review of systems was completed and was negative, except as noted above.   ONCOLOGY TREATMENT TEAM:  1. Surgeon:  Dr. Excell Seltzer at [facility]  2. Medical Oncologist: Dr. Jana Hakim 3. Radiation Oncologist: Dr. Sondra Come    PAST  MEDICAL/SURGICAL HISTORY:  Past Medical History  Diagnosis Date  . Hypertension Dx 2008  . Breast cancer of upper-inner quadrant of left female breast ([place]) 01/27/2015  . Radiation 03/19/15-05/02/15    left  breast   Past Surgical History  Procedure Laterality Date  . Tubal ligation  1970  . Abdominal hysterectomy  1980    uterine prolapse   . Breast lumpectomy with radioactive seed and sentinel lymph node biopsy Left 02/06/2015    Procedure: LEFT BREAST LUMPECTOMY WITH RADIOACTIVE SEED AND LEFT AXILLARY SENTINEL LYMPH NODE BIOPSY;  Surgeon: Excell Seltzer, MD;  Location: [facility];  Service: General;  Laterality: Left;     ALLERGIES:  No Known Allergies   CURRENT MEDICATIONS:  Current Outpatient Prescriptions on File Prior to Visit  Medication Sig Dispense Refill  . anastrozole (ARIMIDEX) 1 MG tablet Take 1 tablet (1 mg total) by mouth daily. 90 tablet 4  . hydrochlorothiazide (HYDRODIURIL) 25 MG tablet Take 1 tablet (25 mg total) by mouth daily. 90 tablet 3   No current facility-administered medications on file prior to visit.     ONCOLOGIC FAMILY HISTORY:  Family History  Problem Relation Age of Onset  . Alzheimer's disease Father   . Cancer Mother     throat cancer- smoker and ETOH   . Heart disease Neg Hx   . Diabetes Neg Hx      GENETIC COUNSELING/TESTING: No    SOCIAL HISTORY:  Marissa Williams is separated and lives alone in [place].  She has 3 children who are alive and one son who died as a child. 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 She denies any current or history of tobacco or illicit drug use.  She does drink 2 beers / night on the days that she works.   PHYSICAL EXAMINATION:  Vital Signs: Filed Vitals:   05/20/15 1540  BP: 134/74  Pulse: 84  Temp: 97.5 F (36.4 C)  Resp: 18   ECOG Performance Status: 0  General: Well-nourished, well-appearing female in no acute distress.  She is unaccompanied in clinic today.   HEENT: Head is atraumatic and normocephalic.  Pupils equal and reactive to light and accomodation. Conjunctivae clear without exudate.  Sclerae anicteric. Oral mucosa is  pink, moist, and intact without lesions.  Oropharynx is pink without lesions or erythema.  Lymph: No cervical, supraclavicular, infraclavicular, or axillary lymphadenopathy noted on palpation.  Cardiovascular: Regular rate and rhythm without murmurs, rubs, or gallops. Respiratory: Clear to auscultation bilaterally. Chest expansion symmetric without accessory muscle use on inspiration or expiration.  GI: Abdomen soft and round. No tenderness to palpation. Bowel sounds normoactive in 4 quadrants.  GU: Deferred.  Musculoskeletal: Muscle strength 5/5 in all extremities.   Neuro: No focal deficits. Steady gait.  Psych: Mood and affect normal and appropriate for situation.  Extremities: No edema, cyanosis, or clubbing.  Skin: Warm and dry. No open lesions noted.   LABORATORY DATA:  None for this visit.  DIAGNOSTIC IMAGING:  None for this visit.     ASSESSMENT AND PLAN:   1. History of breast cancer: Stage IA invasive ductal carcinoma of the left breast, grade 2, ER positive, PR negative, HER2/neu negative, lymphovascular and perineural invasion, S/P lumpectomy and adjuvant radiation, declined chemotherapy, now on adjuvant endocrine therapy with anastrozole.  Ms. Loomer is doing well without clinical symptoms worrisome for disease recurrence. She will follow-up with her medical oncologist,  Dr. Jana Hakim, in January 2017 with history  and physical examination per surveillance protocol.  She will continue her anti-estrogen therapy with anastrozole as prescribed by Dr. Jana Hakim at this time. She states that the hot flashes are bearable at this time. Regarding her vaginal dryness, we discussed the etiology behind the symptoms and various strategies to help reduce it. I encouraged her to begin use of a vaginal moisturizer (such as Replens) 2 x / week and instructed her to avoid any preparation with estrogen.  We also discussed the "Healthy Pelvic Floor / Intimacy" class offered here at the [facility], and  I encouraged her to attend that.  If needed, we could also make referral to [facility] for assistance in treatment.  She was instructed to make Dr. Jana Hakim or myself aware if she begins to experience any new or increased side effects of the medication and I could see her back in clinic to help manage those side effects, as needed. A comprehensive survivorship care plan and treatment summary was reviewed with the patient today detailing her breast cancer diagnosis, treatment course, potential late/long-term effects of treatment, appropriate follow-up care with recommendations for the future, and patient education resources.  A copy of this summary, along with a letter will be sent to the patient's primary care provider via in basket message after today's visit.  Ms. Rambeau is welcome to return to the Survivorship Clinic in the future, as needed; no follow-up will be scheduled at this time.    2. Bone health:  Given Ms. Lemme's age/history of breast cancer and her current treatment regimen including endocrine therapy with anastrozole, she is at risk for bone demineralization.  Per our records, I can find no record of a DEXA scan. I have encouraged her to discuss with Dr. Jana Hakim at her upcoming appointment.  In the meantime, she was encouraged to increase her consumption of foods rich in calcium and vitamin D as well as to increase her weight-bearing activities.  She was given education on specific activities to promote bone health.  3. Cancer screening:  Due to Ms. Gladwin's history and her age, she should receive screening for skin cancers and colon cancer. She is S/P hysterectomy. The information and recommendations are listed on the patient's comprehensive care plan/treatment summary and were reviewed in detail with the patient.    4. Health maintenance and wellness promotion: Ms. Pelham was encouraged to consume 5-7 servings of fruits and vegetables per day. We reviewed the "Nutrition Rainbow" handout, as  well as discussed recommendations to maximize nutrition and minimize recurrence, such as increased intake of fruits, vegetables, lean proteins, and minimizing the intake of red meats and processed foods.  She was also encouraged to engage in moderate to vigorous exercise for 30 minutes per day most days of the week. We discussed the [employer] fitness program, which is designed for cancer survivors to help them become more physically fit after cancer treatments.  She was instructed to reduce her alcohol consumption and continue to abstain from tobacco use. A copy of the "Take Control of Your Health" brochure was given to her reinforcing these recommendations.  We discussed strategies to promote effective sleep including going to bed and arising at the same time each day.     5. Support services/counseling: It is not uncommon for this period of the patient's cancer care trajectory to be one of many emotions and stressors.  We discussed an opportunity for her to participate in the next session of [facility] ("Finding Your New Normal") support group series  designed for patients after they have completed treatment.   Ms. Teschner was encouraged to take advantage of our many other support services programs, support groups, and/or counseling in coping with her new life as a cancer survivor after completing anti-cancer treatment.  She was offered support today through active listening and expressive supportive counseling.  She was given information regarding our available services and encouraged to contact me with any questions or for help enrolling in any of our support group/programs.    A total of 50 minutes of face-to-face time was spent with this patient with greater than 50% of that time in counseling and care-coordination.   Sylvan Cheese, NP  Survivorship Program [facility] [phone]   Note: PRIMARY CARE PROVIDER Minerva Ends, [place] [phone]

## 2015-05-20 NOTE — Telephone Encounter (Signed)
Gave and printed appt sched and avs fo rpt for Jan..the patient needed to rs

## 2015-06-23 ENCOUNTER — Other Ambulatory Visit: Payer: Self-pay | Admitting: Family Medicine

## 2015-06-23 MED FILL — ANASTROZOLE 1 MG TABLET: 1 | 90 days supply | Qty: 90 | Fill #1

## 2015-06-24 MED FILL — HYDROCHLOROTHIAZIDE 25 MG T: 25 | 30 days supply | Qty: 30 | Fill #0

## 2015-06-26 ENCOUNTER — Other Ambulatory Visit: Payer: 59

## 2015-06-26 ENCOUNTER — Ambulatory Visit: Payer: 59 | Admitting: Oncology

## 2015-07-01 ENCOUNTER — Other Ambulatory Visit: Payer: Self-pay

## 2015-07-01 DIAGNOSIS — C50212 Malignant neoplasm of upper-inner quadrant of left female breast: Secondary | ICD-10-CM

## 2015-07-02 ENCOUNTER — Telehealth: Payer: Self-pay | Admitting: Oncology

## 2015-07-02 ENCOUNTER — Ambulatory Visit (HOSPITAL_BASED_OUTPATIENT_CLINIC_OR_DEPARTMENT_OTHER): Payer: 59 | Admitting: Oncology

## 2015-07-02 ENCOUNTER — Other Ambulatory Visit (HOSPITAL_BASED_OUTPATIENT_CLINIC_OR_DEPARTMENT_OTHER): Payer: 59

## 2015-07-02 VITALS — BP 135/88 | HR 70 | Temp 97.9°F | Resp 18 | Ht 63.0 in | Wt 152.7 lb

## 2015-07-02 DIAGNOSIS — N898 Other specified noninflammatory disorders of vagina: Secondary | ICD-10-CM

## 2015-07-02 DIAGNOSIS — C50212 Malignant neoplasm of upper-inner quadrant of left female breast: Secondary | ICD-10-CM

## 2015-07-02 DIAGNOSIS — Z170 Estrogen receptor positive status [ER+]: Secondary | ICD-10-CM

## 2015-07-02 DIAGNOSIS — N951 Menopausal and female climacteric states: Secondary | ICD-10-CM | POA: Diagnosis not present

## 2015-07-02 LAB — CBC WITH DIFFERENTIAL/PLATELET
BASO%: 0.3 % (ref 0.0–2.0)
Basophils Absolute: 0 10*3/uL (ref 0.0–0.1)
EOS ABS: 0.1 10*3/uL (ref 0.0–0.5)
EOS%: 2 % (ref 0.0–7.0)
HCT: 35 % (ref 34.8–46.6)
HEMOGLOBIN: 12.7 g/dL (ref 11.6–15.9)
LYMPH%: 34.5 % (ref 14.0–49.7)
MCH: 29 pg (ref 25.1–34.0)
MCHC: 36.3 g/dL — ABNORMAL HIGH (ref 31.5–36.0)
MCV: 79.9 fL (ref 79.5–101.0)
MONO#: 0.6 10*3/uL (ref 0.1–0.9)
MONO%: 10.3 % (ref 0.0–14.0)
NEUT%: 52.9 % (ref 38.4–76.8)
NEUTROS ABS: 3.1 10*3/uL (ref 1.5–6.5)
Platelets: 322 10*3/uL (ref 145–400)
RBC: 4.38 10*6/uL (ref 3.70–5.45)
RDW: 13.8 % (ref 11.2–14.5)
WBC: 5.9 10*3/uL (ref 3.9–10.3)
lymph#: 2 10*3/uL (ref 0.9–3.3)

## 2015-07-02 LAB — TECHNOLOGIST REVIEW

## 2015-07-02 LAB — COMPREHENSIVE METABOLIC PANEL
ALT: 11 U/L (ref 0–55)
ANION GAP: 7 meq/L (ref 3–11)
AST: 14 U/L (ref 5–34)
Albumin: 3.7 g/dL (ref 3.5–5.0)
Alkaline Phosphatase: 53 U/L (ref 40–150)
BILIRUBIN TOTAL: 0.46 mg/dL (ref 0.20–1.20)
BUN: 17.3 mg/dL (ref 7.0–26.0)
CALCIUM: 9.5 mg/dL (ref 8.4–10.4)
CO2: 29 meq/L (ref 22–29)
CREATININE: 0.8 mg/dL (ref 0.6–1.1)
Chloride: 104 mEq/L (ref 98–109)
EGFR: >90 mL/min/{1.73_m2} (ref >90)
Glucose: 100 mg/dl (ref 70–140)
Potassium: 3.7 mEq/L (ref 3.5–5.1)
Sodium: 140 mEq/L (ref 136–145)
TOTAL PROTEIN: 7.5 g/dL (ref 6.4–8.3)

## 2015-07-02 MED ORDER — ANASTROZOLE 1 MG PO TABS: 1.0000 mg | ORAL_TABLET | Freq: Every day | ORAL | Status: DC

## 2015-07-02 NOTE — Addendum Note (Signed)
Addended by: Chauncey Cruel on: 07/02/2015 02:15 PM   Modules accepted: Orders, SmartSet

## 2015-07-02 NOTE — Telephone Encounter (Signed)
Scheduled appts for Aug 2017. 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

## 2015-07-02 NOTE — Progress Notes (Signed)
[place] [facility]  [phone] [phone]     ID: Marissa Williams DOB: 1951/03/01  MR#: [id number]  TMA#:[id number]  Patient Care Team: Boykin Nearing, MD as PCP - General (Family Medicine) Excell Seltzer, MD as Consulting Physician (General Surgery) Chauncey Cruel, MD as Consulting Physician (Oncology) Gery Pray, MD as Consulting Physician (Radiation Oncology) Mauro Kaufmann, RN as Registered Nurse Rockwell Germany, RN as Registered Nurse Sylvan Cheese, NP as Nurse Practitioner (Nurse Practitioner) PCP: Minerva Ends, MD OTHER MD:  CHIEF COMPLAINT: Estrogen receptor positive breast cancer  CURRENT TREATMENT: Anastrozole   BREAST CANCER HISTORY: From the original intake note:  Marissa Williams noted a change in her left breast sometime in June 2016. She had had bilateral screening mammography [facility] 02/12/2014 with no suspicious findings. Eventually Marissa Williams brought her concern to medical attention and her primary care physician set her up for bilateral left diagnostic mammography with tomography and left breast [facility] 03/04/2015. The breast density was category B. In the region of a palpable mass (10:30 position left breast 9 cm from the nipple) there was a spiculated mass measuring approximately 10 mm on mammography. On ultrasound this was an irregular hypoechoic mass measuring 9 mm. There was no internal vascular flow. The mass abuts the pectoralis muscle but does not appear to invade it. Left axillary ultrasound showed normal appearing lymph nodes.  Biopsy of the left breast mass in question 01/21/2015 showed ([id number]) an invasive ductal carcinoma, E-cadherin positive, grade 2, estrogen receptor 100% positive, with strong staining intensity; progesterone receptor negative, with an MIB-1 of 20% and no HER-2 amplification, the signals ratio being 1.48 and the number per cell 2.00.  Her subsequent  history is as detailed below  INTERVAL HISTORY: Marissa Williams returns today for follow-up of her estrogen receptor positive breast cancer. Since her last visit with me she decided against chemotherapy and proceeded to radiation. She did well with that, with some hyperpigmentation and some changes in the left breast as the only concern. She then resumed anastrozole, which she had taken briefly neoadjuvantly. She obtains that at an [facility]. She has some hot flashes, but she tells me she works mostly in the or an "they keep it really cold there". She is comfortable with that. She also has some vaginal dryness which is not a major issue but something she would like to learn how to manage better  REVIEW OF SYSTEMS: She has some shooting pains in the left breast and some soreness. Her left nipple is different. She wanted me to make sure to evaluate that today. Aside from these issues a detailed review of systems today was benign  PAST MEDICAL HISTORY: Past Medical History  Diagnosis Date  . Hypertension Dx 2008  . Breast cancer of upper-inner quadrant of left female breast ([place]) 01/27/2015  . Radiation 03/19/15-05/02/15    left breast    PAST SURGICAL HISTORY: Past Surgical History  Procedure Laterality Date  . Tubal ligation  1970  . Abdominal hysterectomy  1980    uterine prolapse   . Breast lumpectomy with radioactive seed and sentinel lymph node biopsy Left 02/06/2015    Procedure: LEFT BREAST LUMPECTOMY WITH RADIOACTIVE SEED AND LEFT AXILLARY SENTINEL LYMPH NODE BIOPSY;  Surgeon: Excell Seltzer, MD;  Location: [facility];  Service: General;  Laterality: Left;    FAMILY HISTORY Family History  Problem Relation Age of Onset  . Alzheimer's disease Father   . Cancer  Mother     throat cancer- smoker and ETOH   . Heart disease Neg Hx   . Diabetes Neg Hx    the patient's father died at the age of 46 with Alzheimer's disease. The patient's mother died from what seemed to have  been DTs at the age of 56. She had a history of throat cancer and EtOH. The patient was an only child. There is no other history of cancer in the family to her knowledge   GYNECOLOGIC HISTORY:  No LMP recorded. Patient has had a hysterectomy.  menarche age 67, first live birth age 68. The patient is GX P4. She underwent hysterectomy without salpingo-oophorectomy in 1980. She did not take hormone replacement.   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 This is a new job for her, only 4 months. She is very keen to continue working there and does not want her cancer treatment to interfere if at all possible. She is single, and lives by herself, with no pets. Her son Marissa Williams lives in [place] where he works as an Surveyor, minerals. Her son Marissa Williams is a Pharmacist, hospital working in [place]. Daughter Marissa Williams lives in [place] currently under unfortunate circumstances. The patient's fourth child a son, was born premature and died at 66 months from lung problems. Marissa Williams has 7 grandchildren and 3 great-grandchildren. She attends a local [employer]:  not in place    HEALTH MAINTENANCE: Social History  Substance Use Topics  . Smoking status: Never Smoker   . Smokeless tobacco: Never Used  . Alcohol Use: Yes     Comment: occas     Colonoscopy: October 2014/[facility]  PAP: status post hysterectomy   Bone density: never   Lipid panel:  No Known Allergies  Current Outpatient Prescriptions  Medication Sig Dispense Refill  . anastrozole (ARIMIDEX) 1 MG tablet Take 1 tablet (1 mg total) by mouth daily. 90 tablet 4  . hydrochlorothiazide (HYDRODIURIL) 25 MG tablet Take 1 tablet (25 mg total) by mouth daily. Needs office visit for refills 30 tablet 0   No current facility-administered medications for this visit.    OBJECTIVE: Middle-aged African-American woman who appears well Filed Vitals:   07/02/15 1346  BP: 135/88  Pulse: 70    Temp: 97.9 F (36.6 C)  Resp: 18     Body mass index is 27.06 kg/(m^2).    ECOG FS:0 - Asymptomatic  Sclerae unicteric, pupils round and equal Oropharynx clear and moist-- no thrush or other lesions No cervical or supraclavicular adenopathy Lungs no rales or rhonchi Heart regular rate and rhythm Abd soft, nontender, positive bowel sounds MSK no focal spinal tenderness, no upper extremity lymphedema Neuro: nonfocal, well oriented, appropriate affect Breasts: The right breast is unremarkable. The left breast is status post lumpectomy and radiation. There is some hyperpigmentation particularly over the boost area. Otherwise this is minimal. Her left nipple areola complex is considerably firmer than the right. This is a result of her radiation treatments. There is no subjacent mass and there is no skin or nipple change of concern. The left axilla is benign.     LAB RESULTS:  CMP     Component Value Date/Time   NA 140 07/02/2015 1334   NA 140 04/16/2014 1141   K 3.7 07/02/2015 1334   K 4.4 04/16/2014 1141   CL 104 04/16/2014 1141   CO2 29 07/02/2015 1334   CO2 28 04/16/2014 1141  GLUCOSE 100 07/02/2015 1334   GLUCOSE 87 04/16/2014 1141   BUN 17.3 07/02/2015 1334   BUN 12 04/16/2014 1141   CREATININE 0.8 07/02/2015 1334   CREATININE 0.68 04/16/2014 1141   CREATININE 0.76 03/24/2010 2121   CALCIUM 9.5 07/02/2015 1334   CALCIUM 9.6 04/16/2014 1141   PROT 7.5 07/02/2015 1334   PROT 7.1 04/16/2014 1141   ALBUMIN 3.7 07/02/2015 1334   ALBUMIN 4.0 04/16/2014 1141   AST 14 07/02/2015 1334   AST 18 04/16/2014 1141   ALT 11 07/02/2015 1334   ALT 11 04/16/2014 1141   ALKPHOS 53 07/02/2015 1334   ALKPHOS 53 04/16/2014 1141   BILITOT 0.46 07/02/2015 1334   BILITOT 0.5 04/16/2014 1141   GFRNONAA >89 04/16/2014 1141   GFRNONAA 60* 03/06/2009 1800   GFRAA >89 04/16/2014 1141   GFRAA  03/06/2009 1800    >60        The eGFR has been calculated using the MDRD equation. This  calculation has not been validated in all clinical situations. eGFR's persistently <60 mL/min signify possible Chronic Kidney Disease.    INo results found for: SPEP, UPEP  Lab Results  Component Value Date   WBC 5.9 07/02/2015   NEUTROABS 3.1 07/02/2015   HGB 12.7 07/02/2015   HCT 35.0 07/02/2015   MCV 79.9 07/02/2015   PLT 322 07/02/2015      Chemistry      Component Value Date/Time   NA 140 07/02/2015 1334   NA 140 04/16/2014 1141   K 3.7 07/02/2015 1334   K 4.4 04/16/2014 1141   CL 104 04/16/2014 1141   CO2 29 07/02/2015 1334   CO2 28 04/16/2014 1141   BUN 17.3 07/02/2015 1334   BUN 12 04/16/2014 1141   CREATININE 0.8 07/02/2015 1334   CREATININE 0.68 04/16/2014 1141   CREATININE 0.76 03/24/2010 2121      Component Value Date/Time   CALCIUM 9.5 07/02/2015 1334   CALCIUM 9.6 04/16/2014 1141   ALKPHOS 53 07/02/2015 1334   ALKPHOS 53 04/16/2014 1141   AST 14 07/02/2015 1334   AST 18 04/16/2014 1141   ALT 11 07/02/2015 1334   ALT 11 04/16/2014 1141   BILITOT 0.46 07/02/2015 1334   BILITOT 0.5 04/16/2014 1141       No results found for: LABCA2  No components found for: LABCA125  No results for input(s): INR in the last 168 hours.  Urinalysis    Component Value Date/Time   COLORURINE YELLOW 03/06/2009 1804   APPEARANCEUR CLEAR 03/06/2009 1804   LABSPEC 1.026 03/06/2009 1804   PHURINE 5.5 03/06/2009 1804   GLUCOSEU NEGATIVE 03/06/2009 1804   HGBUR LARGE* 03/06/2009 1804   BILIRUBINUR SMALL* 03/06/2009 1804   KETONESUR 40* 03/06/2009 1804   PROTEINUR NEGATIVE 03/06/2009 1804   UROBILINOGEN 1.0 03/06/2009 1804   NITRITE NEGATIVE 03/06/2009 1804   LEUKOCYTESUR TRACE* 03/06/2009 1804    STUDIES: No results found.  ASSESSMENT: 65 y.o. [place] woman status post left breast biopsy 01/21/2015 for a clinical T1b N0, stage IA invasive ductal carcinoma, grade 1 or 2, strongly estrogen receptor positive, progesterone receptor negative, with no HER-2  amplification and an MIB-1 of 20%  (1) left lumpectomy and sentinel lymph node sampling 02/06/2015 showed a pT1c pN0, stage IA invasive ductal carcinoma, grade 2, with negative margins   (2) OncotypeDX score of 29 falls in the intermediate range and predicts a 10 year risk of outside the breast recurrence of 19% if the patient's only systemic  therapy is tamoxifen for 5 years   (a) patient declined adjuvant chemotherapy  (3) adjuvant radiation 03/19/2015-05/02/2015: Left breast 50.4 Gy in 28 fx's; lumpectomy cavity boost 10 Gy in 5 fx's  (4) on anastrozole as of November 2016  PLAN: Saddie is now approximately 4 months out from her definitive surgery with no evidence of disease recurrence. She is tolerating the anastrozole well, with the exception of hot flashes and vaginal dryness. She does not really want any pharmacological intervention for the hot flashes: She is working in the very cold environment and that pretty much takes care of it. For the vaginal dryness issue I have given her information on our intimacy and pelvic health program.  I discussed these different types of discomfort that patients may experience after this surgery and radiation that she has had. The feelings of soreness, shooting pains, and occasionally feelings of chest tightness are all very normal. The firmness of her left areola is also unremarkable. She understands her left breast will become a little bit firmer and a little bit smaller over the next year. This is expected.  I have encouraged [facility] which [facility] by the survivorship nurse practitioner.  Her next mammogram will be in August. She will see [place] that same month. If everything is going well at that time we will start yearly follow-up until she completes her 5 years on anastrozole.  She knows to call for any problems that may develop before her next visit here. Chauncey Cruel, MD   07/02/2015 2:11  PM Medical Oncology and Hematology [facility] [address] [place], [place] [address] Tel. [phone]    Fax. [phone]

## 2015-07-21 DIAGNOSIS — M50222 Other cervical disc displacement at C5-C6 level: Secondary | ICD-10-CM | POA: Diagnosis not present

## 2015-07-21 DIAGNOSIS — M25551 Pain in right hip: Secondary | ICD-10-CM | POA: Diagnosis not present

## 2015-07-21 DIAGNOSIS — M50322 Other cervical disc degeneration at C5-C6 level: Secondary | ICD-10-CM | POA: Diagnosis not present

## 2015-07-21 DIAGNOSIS — M545 Low back pain: Secondary | ICD-10-CM | POA: Diagnosis not present

## 2015-07-21 DIAGNOSIS — M461 Sacroiliitis, not elsewhere classified: Secondary | ICD-10-CM | POA: Diagnosis not present

## 2015-07-21 DIAGNOSIS — M542 Cervicalgia: Secondary | ICD-10-CM | POA: Diagnosis not present

## 2015-07-21 DIAGNOSIS — M791 Myalgia: Secondary | ICD-10-CM | POA: Diagnosis not present

## 2015-07-30 DIAGNOSIS — M50322 Other cervical disc degeneration at C5-C6 level: Secondary | ICD-10-CM | POA: Diagnosis not present

## 2015-07-30 DIAGNOSIS — M542 Cervicalgia: Secondary | ICD-10-CM | POA: Diagnosis not present

## 2015-07-30 DIAGNOSIS — M50222 Other cervical disc displacement at C5-C6 level: Secondary | ICD-10-CM | POA: Diagnosis not present

## 2015-07-30 DIAGNOSIS — M791 Myalgia: Secondary | ICD-10-CM | POA: Diagnosis not present

## 2015-07-30 DIAGNOSIS — M461 Sacroiliitis, not elsewhere classified: Secondary | ICD-10-CM | POA: Diagnosis not present

## 2015-08-04 DIAGNOSIS — M5137 Other intervertebral disc degeneration, lumbosacral region: Secondary | ICD-10-CM | POA: Diagnosis not present

## 2015-08-04 DIAGNOSIS — M50322 Other cervical disc degeneration at C5-C6 level: Secondary | ICD-10-CM | POA: Diagnosis not present

## 2015-08-04 DIAGNOSIS — M461 Sacroiliitis, not elsewhere classified: Secondary | ICD-10-CM | POA: Diagnosis not present

## 2015-08-04 DIAGNOSIS — M5127 Other intervertebral disc displacement, lumbosacral region: Secondary | ICD-10-CM | POA: Diagnosis not present

## 2015-08-04 DIAGNOSIS — M791 Myalgia: Secondary | ICD-10-CM | POA: Diagnosis not present

## 2015-08-04 DIAGNOSIS — M50222 Other cervical disc displacement at C5-C6 level: Secondary | ICD-10-CM | POA: Diagnosis not present

## 2015-08-06 DIAGNOSIS — M461 Sacroiliitis, not elsewhere classified: Secondary | ICD-10-CM | POA: Diagnosis not present

## 2015-08-06 DIAGNOSIS — M791 Myalgia: Secondary | ICD-10-CM | POA: Diagnosis not present

## 2015-08-06 DIAGNOSIS — M542 Cervicalgia: Secondary | ICD-10-CM | POA: Diagnosis not present

## 2015-08-06 DIAGNOSIS — M545 Low back pain: Secondary | ICD-10-CM | POA: Diagnosis not present

## 2015-08-06 DIAGNOSIS — M50222 Other cervical disc displacement at C5-C6 level: Secondary | ICD-10-CM | POA: Diagnosis not present

## 2015-08-06 DIAGNOSIS — M50322 Other cervical disc degeneration at C5-C6 level: Secondary | ICD-10-CM | POA: Diagnosis not present

## 2015-08-07 DIAGNOSIS — M50322 Other cervical disc degeneration at C5-C6 level: Secondary | ICD-10-CM | POA: Diagnosis not present

## 2015-08-07 DIAGNOSIS — M542 Cervicalgia: Secondary | ICD-10-CM | POA: Diagnosis not present

## 2015-08-07 DIAGNOSIS — M50222 Other cervical disc displacement at C5-C6 level: Secondary | ICD-10-CM | POA: Diagnosis not present

## 2015-08-07 DIAGNOSIS — M791 Myalgia: Secondary | ICD-10-CM | POA: Diagnosis not present

## 2015-08-07 DIAGNOSIS — M461 Sacroiliitis, not elsewhere classified: Secondary | ICD-10-CM | POA: Diagnosis not present

## 2015-08-11 ENCOUNTER — Ambulatory Visit: Payer: 59 | Attending: Family Medicine | Admitting: Family Medicine

## 2015-08-11 ENCOUNTER — Encounter: Payer: Self-pay | Admitting: Family Medicine

## 2015-08-11 ENCOUNTER — Other Ambulatory Visit: Payer: Self-pay | Admitting: Family Medicine

## 2015-08-11 VITALS — BP 138/82 | HR 86 | Temp 98.3°F | Resp 16 | Ht 63.0 in | Wt 156.0 lb

## 2015-08-11 DIAGNOSIS — Z114 Encounter for screening for human immunodeficiency virus [HIV]: Secondary | ICD-10-CM | POA: Insufficient documentation

## 2015-08-11 DIAGNOSIS — Z1159 Encounter for screening for other viral diseases: Secondary | ICD-10-CM | POA: Diagnosis not present

## 2015-08-11 DIAGNOSIS — Z23 Encounter for immunization: Secondary | ICD-10-CM | POA: Diagnosis not present

## 2015-08-11 DIAGNOSIS — Z853 Personal history of malignant neoplasm of breast: Secondary | ICD-10-CM | POA: Insufficient documentation

## 2015-08-11 DIAGNOSIS — I10 Essential (primary) hypertension: Secondary | ICD-10-CM | POA: Insufficient documentation

## 2015-08-11 DIAGNOSIS — Z760 Encounter for issue of repeat prescription: Secondary | ICD-10-CM | POA: Diagnosis not present

## 2015-08-11 DIAGNOSIS — Z79899 Other long term (current) drug therapy: Secondary | ICD-10-CM | POA: Insufficient documentation

## 2015-08-11 MED ORDER — HYDROCHLOROTHIAZIDE 12.5 MG PO TABS: 12.5000 mg | ORAL_TABLET | Freq: Every day | ORAL | Status: DC

## 2015-08-11 MED FILL — HYDROCHLOROTHIAZIDE 12.5 MG: 12.5 | 90 days supply | Qty: 90 | Fill #0

## 2015-08-11 NOTE — Progress Notes (Signed)
   Subjective:  Patient ID: Marissa Williams, female    DOB: 05/15/1951  Age: 65 y.o. MRN: [id number]  CC: Medication Refill and Hypertension   HPI Marissa Williams presents for    1. CHRONIC HYPERTENSION  Disease Monitoring  Blood pressure range: not checking   Chest pain: no   Dyspnea: no   Claudication: no   Medication compliance: yes, but ran out one week ago   Medication Side Effects  Lightheadedness: no   Urinary frequency: no   Edema: no     2. S/p breast cancer: she has completed all treatment. On arimidex for 5 year course. Has increased hot flashes, but feeling well overall. Eating gluten free and organic.    Social History  Substance Use Topics  . Smoking status: Never Smoker   . Smokeless tobacco: Never Used  . Alcohol Use: Yes     Comment: occas    Outpatient Prescriptions Prior to Visit  Medication Sig Dispense Refill  . anastrozole (ARIMIDEX) 1 MG tablet Take 1 tablet (1 mg total) by mouth daily. 90 tablet 4  . hydrochlorothiazide (HYDRODIURIL) 25 MG tablet Take 1 tablet (25 mg total) by mouth daily. Needs office visit for refills 30 tablet 0   No facility-administered medications prior to visit.    ROS Review of Systems  Constitutional: Negative for fever and chills.  Eyes: Negative for visual disturbance.  Respiratory: Negative for shortness of breath.   Cardiovascular: Negative for chest pain.  Gastrointestinal: Negative for abdominal pain and blood in stool.  Endocrine:       Hot flashes   Musculoskeletal: Negative for back pain and arthralgias.  Skin: Negative for rash.  Allergic/Immunologic: Negative for immunocompromised state.  Hematological: Negative for adenopathy. Does not bruise/bleed easily.  Psychiatric/Behavioral: Negative for suicidal ideas and dysphoric mood.    Objective:  BP 138/82 mmHg  Pulse 86  Temp(Src) 98.3 F (36.8 C) (Oral)  Resp 16  Ht 5\' 3"  (1.6 m)  Wt 156 lb (70.761 kg)  BMI 27.64 kg/m2  SpO2 100%  BP/Weight  08/11/2015 07/02/2015 [id number]  Systolic BP 0000000 [id number]  Diastolic BP 82 88 74  Wt. (Lbs) 156 152.7 153.8  BMI 27.64 27.06 27.25   Physical Exam  Constitutional: She is oriented to person, place, and time. She appears well-developed and well-nourished. No distress.  HENT:  Head: Normocephalic and atraumatic.  Cardiovascular: Normal rate, regular rhythm, normal heart sounds and intact distal pulses.   Pulmonary/Chest: Effort normal and breath sounds normal.  Musculoskeletal: She exhibits no edema.  Neurological: She is alert and oriented to person, place, and time.  Skin: Skin is warm and dry. No rash noted.  Psychiatric: She has a normal mood and affect.   Lab Results  Component Value Date   HGBA1C 5.4 02/15/2014    Assessment & Plan:   Mazey was seen today for medication refill and hypertension.  Diagnoses and all orders for this visit:  Screening for HIV (human immunodeficiency virus) -     HIV antibody (with reflex)  Need for hepatitis C screening test -     Hepatitis C antibody, reflex  Essential hypertension -     hydrochlorothiazide (HYDRODIURIL) 12.5 MG tablet; Take 1 tablet (12.5 mg total) by mouth daily. Needs office visit for refills  Other orders -     Pneumococcal conjugate vaccine 13-valent   Follow-up: No Follow-up on file.   Boykin Nearing MD

## 2015-08-11 NOTE — Assessment & Plan Note (Signed)
A: HTN, well controlled P: Decrease HCTZ from 25 to 12.5 mg daily

## 2015-08-11 NOTE — Progress Notes (Signed)
Patient's here for medication refill.  Patient declines A1c test.  Patient has no further concerns.

## 2015-08-11 NOTE — Patient Instructions (Signed)
Marissa Williams was seen today for medication refill and hypertension.  Diagnoses and all orders for this visit:  Screening for HIV (human immunodeficiency virus) -     HIV antibody (with reflex)  Need for hepatitis C screening test -     Hepatitis C antibody, reflex  Essential hypertension -     hydrochlorothiazide (HYDRODIURIL) 12.5 MG tablet; Take 1 tablet (12.5 mg total) by mouth daily. Needs office visit for refills    I will send your results to your mychart We will obtain your colonoscopy report   F/u in 3 months for HTN  Second pneumonia short, pneumovax due in 6 months   Dr. Adrian Blackwater

## 2015-08-12 DIAGNOSIS — M50322 Other cervical disc degeneration at C5-C6 level: Secondary | ICD-10-CM | POA: Diagnosis not present

## 2015-08-12 DIAGNOSIS — M50222 Other cervical disc displacement at C5-C6 level: Secondary | ICD-10-CM | POA: Diagnosis not present

## 2015-08-12 DIAGNOSIS — M461 Sacroiliitis, not elsewhere classified: Secondary | ICD-10-CM | POA: Diagnosis not present

## 2015-08-12 DIAGNOSIS — M791 Myalgia: Secondary | ICD-10-CM | POA: Diagnosis not present

## 2015-08-12 LAB — HEPATITIS C ANTIBODY: HCV AB: NEGATIVE

## 2015-08-12 LAB — HIV ANTIBODY (ROUTINE TESTING W REFLEX): HIV 1&2 Ab, 4th Generation: NONREACTIVE

## 2015-08-13 DIAGNOSIS — M50222 Other cervical disc displacement at C5-C6 level: Secondary | ICD-10-CM | POA: Diagnosis not present

## 2015-08-13 DIAGNOSIS — M50322 Other cervical disc degeneration at C5-C6 level: Secondary | ICD-10-CM | POA: Diagnosis not present

## 2015-08-13 DIAGNOSIS — M791 Myalgia: Secondary | ICD-10-CM | POA: Diagnosis not present

## 2015-08-13 DIAGNOSIS — M461 Sacroiliitis, not elsewhere classified: Secondary | ICD-10-CM | POA: Diagnosis not present

## 2015-08-13 DIAGNOSIS — M542 Cervicalgia: Secondary | ICD-10-CM | POA: Diagnosis not present

## 2015-08-14 DIAGNOSIS — M50322 Other cervical disc degeneration at C5-C6 level: Secondary | ICD-10-CM | POA: Diagnosis not present

## 2015-08-14 DIAGNOSIS — M50222 Other cervical disc displacement at C5-C6 level: Secondary | ICD-10-CM | POA: Diagnosis not present

## 2015-08-14 DIAGNOSIS — M461 Sacroiliitis, not elsewhere classified: Secondary | ICD-10-CM | POA: Diagnosis not present

## 2015-08-14 DIAGNOSIS — M542 Cervicalgia: Secondary | ICD-10-CM | POA: Diagnosis not present

## 2015-08-14 DIAGNOSIS — M791 Myalgia: Secondary | ICD-10-CM | POA: Diagnosis not present

## 2015-08-18 DIAGNOSIS — M791 Myalgia: Secondary | ICD-10-CM | POA: Diagnosis not present

## 2015-08-18 DIAGNOSIS — M461 Sacroiliitis, not elsewhere classified: Secondary | ICD-10-CM | POA: Diagnosis not present

## 2015-08-18 DIAGNOSIS — M50322 Other cervical disc degeneration at C5-C6 level: Secondary | ICD-10-CM | POA: Diagnosis not present

## 2015-08-18 DIAGNOSIS — M50222 Other cervical disc displacement at C5-C6 level: Secondary | ICD-10-CM | POA: Diagnosis not present

## 2015-08-21 DIAGNOSIS — M50322 Other cervical disc degeneration at C5-C6 level: Secondary | ICD-10-CM | POA: Diagnosis not present

## 2015-08-21 DIAGNOSIS — M791 Myalgia: Secondary | ICD-10-CM | POA: Diagnosis not present

## 2015-08-21 DIAGNOSIS — M461 Sacroiliitis, not elsewhere classified: Secondary | ICD-10-CM | POA: Diagnosis not present

## 2015-08-21 DIAGNOSIS — M542 Cervicalgia: Secondary | ICD-10-CM | POA: Diagnosis not present

## 2015-08-21 DIAGNOSIS — M50222 Other cervical disc displacement at C5-C6 level: Secondary | ICD-10-CM | POA: Diagnosis not present

## 2015-08-25 DIAGNOSIS — M461 Sacroiliitis, not elsewhere classified: Secondary | ICD-10-CM | POA: Diagnosis not present

## 2015-08-25 DIAGNOSIS — M791 Myalgia: Secondary | ICD-10-CM | POA: Diagnosis not present

## 2015-08-25 DIAGNOSIS — M50222 Other cervical disc displacement at C5-C6 level: Secondary | ICD-10-CM | POA: Diagnosis not present

## 2015-08-25 DIAGNOSIS — M50322 Other cervical disc degeneration at C5-C6 level: Secondary | ICD-10-CM | POA: Diagnosis not present

## 2015-08-27 DIAGNOSIS — M50322 Other cervical disc degeneration at C5-C6 level: Secondary | ICD-10-CM | POA: Diagnosis not present

## 2015-08-27 DIAGNOSIS — M791 Myalgia: Secondary | ICD-10-CM | POA: Diagnosis not present

## 2015-08-27 DIAGNOSIS — M50222 Other cervical disc displacement at C5-C6 level: Secondary | ICD-10-CM | POA: Diagnosis not present

## 2015-08-27 DIAGNOSIS — M542 Cervicalgia: Secondary | ICD-10-CM | POA: Diagnosis not present

## 2015-08-27 DIAGNOSIS — M461 Sacroiliitis, not elsewhere classified: Secondary | ICD-10-CM | POA: Diagnosis not present

## 2015-08-28 DIAGNOSIS — M5137 Other intervertebral disc degeneration, lumbosacral region: Secondary | ICD-10-CM | POA: Diagnosis not present

## 2015-08-28 DIAGNOSIS — M461 Sacroiliitis, not elsewhere classified: Secondary | ICD-10-CM | POA: Diagnosis not present

## 2015-08-28 DIAGNOSIS — M542 Cervicalgia: Secondary | ICD-10-CM | POA: Diagnosis not present

## 2015-08-28 DIAGNOSIS — M5127 Other intervertebral disc displacement, lumbosacral region: Secondary | ICD-10-CM | POA: Diagnosis not present

## 2015-08-28 DIAGNOSIS — M791 Myalgia: Secondary | ICD-10-CM | POA: Diagnosis not present

## 2015-09-01 DIAGNOSIS — G540 Brachial plexus disorders: Secondary | ICD-10-CM | POA: Diagnosis not present

## 2015-09-01 DIAGNOSIS — M5023 Other cervical disc displacement, cervicothoracic region: Secondary | ICD-10-CM | POA: Diagnosis not present

## 2015-09-04 DIAGNOSIS — M5127 Other intervertebral disc displacement, lumbosacral region: Secondary | ICD-10-CM | POA: Diagnosis not present

## 2015-09-04 DIAGNOSIS — M5137 Other intervertebral disc degeneration, lumbosacral region: Secondary | ICD-10-CM | POA: Diagnosis not present

## 2015-09-04 DIAGNOSIS — M791 Myalgia: Secondary | ICD-10-CM | POA: Diagnosis not present

## 2015-09-04 DIAGNOSIS — M461 Sacroiliitis, not elsewhere classified: Secondary | ICD-10-CM | POA: Diagnosis not present

## 2015-10-02 MED FILL — ANASTROZOLE 1 MG TABLET: 1 | 90 days supply | Qty: 90 | Fill #2

## 2015-10-21 ENCOUNTER — Other Ambulatory Visit: Payer: Self-pay | Admitting: Nurse Practitioner

## 2015-10-31 MED FILL — HYDROCHLOROTHIAZIDE 12.5 MG: 12.5 | 90 days supply | Qty: 90 | Fill #1

## 2015-12-17 DIAGNOSIS — H2513 Age-related nuclear cataract, bilateral: Secondary | ICD-10-CM | POA: Diagnosis not present

## 2016-01-05 MED FILL — ANASTROZOLE 1 MG TABLET: 1 | 90 days supply | Qty: 90 | Fill #3

## 2016-01-19 ENCOUNTER — Other Ambulatory Visit: Payer: Self-pay | Admitting: Adult Health

## 2016-01-19 ENCOUNTER — Telehealth: Payer: Self-pay | Admitting: Family Medicine

## 2016-01-19 ENCOUNTER — Telehealth: Payer: Self-pay | Admitting: Adult Health

## 2016-01-19 NOTE — Telephone Encounter (Signed)
Pt came in the facility to drop off paperwork to be filled out by doctor for FL[employer] benefits. Leaving paperwork in mailbox. Please fax forms after its filled out and inform patient. Please f/u. As per patient request you can leave a message on phone number.   Thank you, Johanna B.

## 2016-01-19 NOTE — Telephone Encounter (Signed)
I received a transferred call from Ms. Bonzo stating that she needed to change her survivorship appts (both the lab and follow-up) due to her work schedule.   She needs to have labs anytime on 8/21 and then her visit with me anytime on 8/30.  I let her know that I would put in a request with our schedulers and they would be in contact with her to get these appointments rescheduled.  She voiced understanding and appreciation.  I look forward to participating in her care!  Mike Craze, NP [facility] [phone]

## 2016-01-26 ENCOUNTER — Telehealth: Payer: Self-pay | Admitting: Family Medicine

## 2016-01-26 NOTE — Telephone Encounter (Signed)
Pt called the office to find out the status of her FLMA documents that she dropped off last week. Patient stated that documents need to be filled out and faxed by August 17 or FMLA will be denied. Patient asked that she be called (can leave msg) once docs have been faxed. Please follow up.  Thank you

## 2016-01-28 NOTE — Telephone Encounter (Signed)
Dr. Adrian Blackwater did you receive the paperwork for her FMLA .

## 2016-01-29 NOTE — Telephone Encounter (Signed)
Patient's FMLA paperwork was received.   Called patient regarding FMLA paperwork  Left VM requesting a call back.  There are multiple questions regarding FMLA paperwork form. It will be best to have OV to discuss as the form requires a lot of detailed information.   1. Is this leave related to breast cancer treatment?  I see on the form start date is 01/09/2016. Was there a particular treatment or illness that started at this time? My review of her chart does not reveal a hospitalization or surgery during this time.    3. If the leave related to breast cancer treatment it would be best for her oncologist to complete leave paperwork.   Called [employer] absence management and requested an extension for her paperwork before 02/05/16. This was approved.

## 2016-02-03 ENCOUNTER — Encounter: Payer: Self-pay | Admitting: Oncology

## 2016-02-03 ENCOUNTER — Other Ambulatory Visit: Payer: 59

## 2016-02-03 NOTE — Progress Notes (Signed)
forms left in my box °

## 2016-02-03 NOTE — Telephone Encounter (Signed)
Called back to patient regarding FMLA paperwork.  Left VM per patient instrutions.   Asked her to call or send a mychart message with info regarding medical leave. Based on my review of her chart she did not see a provider prior to or during her leave of absence therefore applying for a medical leave will not work as there were no dates of service to reference.  Patient asked to clarify this if there is some info I am missing.  I informed her that the leave period was extended to 02/05/16.

## 2016-02-04 ENCOUNTER — Other Ambulatory Visit: Payer: Self-pay | Admitting: Family Medicine

## 2016-02-04 DIAGNOSIS — I10 Essential (primary) hypertension: Secondary | ICD-10-CM

## 2016-02-04 MED FILL — HYDROCHLOROTHIAZIDE 12.5 MG: 12.5 | 30 days supply | Qty: 30 | Fill #0

## 2016-02-06 ENCOUNTER — Encounter: Payer: Self-pay | Admitting: Oncology

## 2016-02-06 NOTE — Progress Notes (Signed)
forms left in my box- left forms for dr Jana Hakim to sign

## 2016-02-10 ENCOUNTER — Ambulatory Visit: Payer: 59 | Admitting: Nurse Practitioner

## 2016-02-12 ENCOUNTER — Encounter: Payer: 59 | Admitting: Adult Health

## 2016-02-25 ENCOUNTER — Other Ambulatory Visit (HOSPITAL_BASED_OUTPATIENT_CLINIC_OR_DEPARTMENT_OTHER): Payer: 59

## 2016-02-25 DIAGNOSIS — C50212 Malignant neoplasm of upper-inner quadrant of left female breast: Secondary | ICD-10-CM

## 2016-02-25 LAB — COMPREHENSIVE METABOLIC PANEL
ALBUMIN: 3.7 g/dL (ref 3.5–5.0)
ALK PHOS: 61 U/L (ref 40–150)
ALT: 14 U/L (ref 0–55)
AST: 17 U/L (ref 5–34)
Anion Gap: 10 mEq/L (ref 3–11)
BUN: 19.1 mg/dL (ref 7.0–26.0)
CALCIUM: 9.4 mg/dL (ref 8.4–10.4)
CO2: 24 mEq/L (ref 22–29)
CREATININE: 0.8 mg/dL (ref 0.6–1.1)
Chloride: 106 mEq/L (ref 98–109)
EGFR: >90 mL/min/{1.73_m2} (ref >90)
Glucose: 82 mg/dl (ref 70–140)
POTASSIUM: 3.6 meq/L (ref 3.5–5.1)
Sodium: 140 mEq/L (ref 136–145)
Total Bilirubin: 0.31 mg/dL (ref 0.20–1.20)
Total Protein: 7.7 g/dL (ref 6.4–8.3)

## 2016-02-25 LAB — CBC WITH DIFFERENTIAL/PLATELET
BASO%: 0.5 % (ref 0.0–2.0)
BASOS ABS: 0 10*3/uL (ref 0.0–0.1)
EOS%: 1.9 % (ref 0.0–7.0)
Eosinophils Absolute: 0.1 10*3/uL (ref 0.0–0.5)
HEMATOCRIT: 36.8 % (ref 34.8–46.6)
HEMOGLOBIN: 12.7 g/dL (ref 11.6–15.9)
LYMPH#: 2.3 10*3/uL (ref 0.9–3.3)
LYMPH%: 36.3 % (ref 14.0–49.7)
MCH: 28.8 pg (ref 25.1–34.0)
MCHC: 34.6 g/dL (ref 31.5–36.0)
MCV: 83.4 fL (ref 79.5–101.0)
MONO#: 0.5 10*3/uL (ref 0.1–0.9)
MONO%: 8.5 % (ref 0.0–14.0)
NEUT#: 3.4 10*3/uL (ref 1.5–6.5)
NEUT%: 52.8 % (ref 38.4–76.8)
PLATELETS: 274 10*3/uL (ref 145–400)
RBC: 4.42 10*6/uL (ref 3.70–5.45)
RDW: 12.9 % (ref 11.2–14.5)
WBC: 6.4 10*3/uL (ref 3.9–10.3)

## 2016-03-01 ENCOUNTER — Encounter: Payer: Self-pay | Admitting: Adult Health

## 2016-03-01 ENCOUNTER — Ambulatory Visit (HOSPITAL_BASED_OUTPATIENT_CLINIC_OR_DEPARTMENT_OTHER): Payer: 59 | Admitting: Adult Health

## 2016-03-01 VITALS — BP 137/90 | HR 88 | Temp 97.9°F | Resp 18 | Wt 158.0 lb

## 2016-03-01 DIAGNOSIS — Z170 Estrogen receptor positive status [ER+]: Secondary | ICD-10-CM | POA: Diagnosis not present

## 2016-03-01 DIAGNOSIS — Z79811 Long term (current) use of aromatase inhibitors: Secondary | ICD-10-CM

## 2016-03-01 DIAGNOSIS — C50212 Malignant neoplasm of upper-inner quadrant of left female breast: Secondary | ICD-10-CM | POA: Diagnosis not present

## 2016-03-01 DIAGNOSIS — Z780 Asymptomatic menopausal state: Secondary | ICD-10-CM

## 2016-03-01 NOTE — Progress Notes (Signed)
CLINIC:  Survivorship   REASON FOR VISIT:  Routine follow-up for history of breast cancer.   BRIEF ONCOLOGIC HISTORY:    Breast cancer of upper-inner quadrant of left female breast ([place])   01/21/2015 Breast US    Left breast: irregular hypoechoic mass with angular margins and posterior acoustic shadowing that measures 6 x 7 x 9 mm.      01/22/2015 Initial Biopsy    Breast, left, needle core biopsy, 10:30 o'clock, 8 to 9 CMFN: Invasive mammary carcinoma, ER+ (100%), PR- (0%), HER2/neu negative (ratio 1.48), Ki67 20%      01/22/2015 Clinical Stage    Stage IA: T1b N0      02/06/2015 Definitive Surgery    Left lumpectomy/SLNB ([facility]): Invasive ductal carcinoma, grade 2, + LVI/perineural invasion, HER2/neu repeated and remains negative (ratio 1.22). 2 LNs removed and negative for malignancy (0/2)      02/06/2015 Pathologic Stage    Stage IA; pT1c pN0      02/06/2015 Oncotype testing    RS 29 (19% ROR)      02/25/2015 -  Chemotherapy    Declined chemotherapy      02/25/2015 -  Anti-estrogen oral therapy    Anastrozole 1 mg began; held during radiation therapy (10/5-11/18/16); resumed following completion of therapy      03/19/2015 - 05/02/2015 Radiation Therapy    Adjuvant RT ([facility]): Left breast 50.4 Gy over 28 fractions; left lumpectomy cavity boost 10 Gy over 5 fractions.  Total dose 60.4 Gy      05/20/2015 Survivorship    Survivorship visit completed and care plan given to patient,.       INTERVAL HISTORY:  Marissa Williams presents to the Survivorship Clinic today for routine follow-up for her history of breast cancer.  Overall, she reports feeling quite well. She remains on the anastrozole with great tolerance.  She has occasional hot flashes, but they are not severe nor bothersome for her.  She denies any vaginal dryness, or joint aches/pains.  Overall, she feels great.   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 She loves her  job and her coworkers.  Her supervisor, coworkers, and her sons threw a surprise birthday party for her yesterday at work with pizza, cake, and balloons; she said that she was so grateful that she cried thanking everyone for coming to celebrate her birthday.  She is also involved in [employer].     REVIEW OF SYSTEMS:  Review of Systems  Constitutional: Negative.   HENT: Negative.   Eyes: Negative.   Respiratory: Negative.   Cardiovascular: Negative.   Gastrointestinal: Negative.   Genitourinary: Negative.   Musculoskeletal: Negative.   Skin: Negative.   Neurological: Negative.   Endo/Heme/Allergies:       Occasional hot flashes  Psychiatric/Behavioral: Negative.   GU: Denies vaginal bleeding, discharge, or dryness.  Breast: Denies any new nodularity, masses, tenderness, nipple changes, or nipple discharge.    A 14-point review of systems was completed and was negative, except as noted above.    PAST MEDICAL/SURGICAL HISTORY:  Past Medical History:  Diagnosis Date  . Breast cancer of upper-inner quadrant of left female breast ([place]) 01/27/2015  . Hypertension Dx 2008  . Radiation 03/19/15-05/02/15   left breast   Past Surgical History:  Procedure Laterality Date  . ABDOMINAL HYSTERECTOMY  1980   uterine prolapse   . BREAST LUMPECTOMY WITH RADIOACTIVE SEED AND SENTINEL LYMPH NODE BIOPSY Left 02/06/2015  Procedure: LEFT BREAST LUMPECTOMY WITH RADIOACTIVE SEED AND LEFT AXILLARY SENTINEL LYMPH NODE BIOPSY;  Surgeon: Excell Seltzer, MD;  Location: [facility];  Service: General;  Laterality: Left;  . TUBAL LIGATION  1970     ALLERGIES:  No Known Allergies   CURRENT MEDICATIONS:  Outpatient Encounter Prescriptions as of 03/01/2016  Medication Sig  . anastrozole (ARIMIDEX) 1 MG tablet Take 1 tablet (1 mg total) by mouth daily.  . hydrochlorothiazide (MICROZIDE) 12.5 MG capsule Take 1 capsule (12.5 mg total) by mouth daily.   No  facility-administered encounter medications on file as of 03/01/2016.      ONCOLOGIC FAMILY HISTORY:  Family History  Problem Relation Age of Onset  . Alzheimer's disease Father   . Cancer Mother     throat cancer- smoker and ETOH   . Heart disease Neg Hx   . Diabetes Neg Hx     GENETIC COUNSELING/TESTING: None.  SOCIAL HISTORY:  Marissa Williams is single and lives alone in [place], [place].  She has 3 children, 7 grandchildren, and 3 great-grandchildren. She currently works full-time in housekeeping at [employer]; she has worked there for about 1 year and really enjoys her job.   She denies any current tobacco or illicit drug use.  She has 2 beers ([place]in) per day.    PHYSICAL EXAMINATION:  Vital Signs: Vitals:   03/01/16 1348  BP: 137/90  Pulse: 88  Resp: 18  Temp: 97.9 F (36.6 C)   Filed Weights   03/01/16 1348  Weight: 158 lb (71.7 kg)   General: Well-nourished, well-appearing female in no acute distress.  She is unaccompanied today.   HEENT: Head is normocephalic.  Pupils equal and reactive to light. Conjunctivae clear without exudate.  Sclerae anicteric. Oral mucosa is pink, moist.  Oropharynx is pink without lesions or erythema.  Lymph: No cervical, supraclavicular, or infraclavicular lymphadenopathy noted on palpation.  Cardiovascular: Regular rate and rhythm.Marland Kitchen Respiratory: Clear to auscultation bilaterally. Chest expansion symmetric; breathing non-labored.  Breast Exam:  -Left breast: Post-radiation fibrosis/breast changes noted; no appreciable masses. Mild nipple skin thickening also consistent with post-radiation skin changes; healed lumpectomy scar without erythema or nodularity.  -Right breast: No appreciable masses on palpation. No skin redness, thickening, or peau d'orange appearance; no nipple retraction or nipple discharge.  -Axilla: No axillary adenopathy bilaterally.  GI: Abdomen soft and round; non-tender, non-distended. Bowel sounds normoactive. No  hepatosplenomegaly.   GU: Deferred.  Neuro: No focal deficits. Steady gait.  Psych: Mood and affect normal and appropriate for situation.  Extremities: No edema. Skin: Warm and dry.  LABORATORY DATA:  CBC    Component Value Date/Time   WBC 6.4 02/25/2016 1558   WBC 7.3 03/24/2010 2121   RBC 4.42 02/25/2016 1558   RBC 4.72 03/24/2010 2121   HGB 12.7 02/25/2016 1558   HCT 36.8 02/25/2016 1558   PLT 274 02/25/2016 1558   MCV 83.4 02/25/2016 1558   MCH 28.8 02/25/2016 1558   MCHC 34.6 02/25/2016 1558   MCHC 35.9 03/24/2010 2121   RDW 12.9 02/25/2016 1558   LYMPHSABS 2.3 02/25/2016 1558   MONOABS 0.5 02/25/2016 1558   EOSABS 0.1 02/25/2016 1558   BASOSABS 0.0 02/25/2016 1558   CMP Latest Ref Rng & Units 02/25/2016 07/02/2015 03/04/2015  Glucose 70 - 140 mg/dl 82 100 84  BUN 7.0 - 26.0 mg/dL 19.1 17.3 16.6  Creatinine 0.6 - 1.1 mg/dL 0.8 0.8 0.8  Sodium 136 - 145 mEq/L 140 140 141  Potassium 3.5 -  5.1 mEq/L 3.6 3.7 3.8  Chloride 96 - 112 mEq/L - - -  CO2 22 - 29 mEq/L '24 29 29  '$ Calcium 8.4 - 10.4 mg/dL 9.4 9.5 9.7  Total Protein 6.4 - 8.3 g/dL 7.7 7.5 7.3  Total Bilirubin 0.20 - 1.20 mg/dL 0.31 0.46 0.43  Alkaline Phos 40 - 150 U/L 61 53 53  AST 5 - 34 U/L '17 14 14  '$ ALT 0 - 55 U/L '14 11 13   '$ *Labs reviewed and are stable/within normal limits.    DIAGNOSTIC IMAGING:  Most recent mammogram:  None since diagnosis in 01/2015   ASSESSMENT AND PLAN:  Ms.. Gudgel is a pleasant 65 y.o. female with history of Stage IA left breast invasive ductal carcinoma, ER+/PR+/HER2-, diagnosed in 01/2015, treated with lumpectomy, adjuvant radiation therapy, and anti-estrogen therapy with Anastrozole beginning in 04/2015.  She presents to the Survivorship Clinic for surveillance and routine follow-up.   1. History of Stage IA left breast cancer:  Ms. Herrero is currently clinically without evidence of disease or recurrence of breast cancer on physical exam.  Her annual mammogram is overdue; orders  placed today to have bilateral diagnostic mammogram done sometime within the next couple of weeks.  She will follow-up with her medical oncologist, Dr. Jana Hakim, in 02/2017.  She will continue her anti-estrogen therapy with anastrazole.  She is tolerating the medication very well with largely no side effects except for occasional hot flashes, which are manageable for her without intervention.  I encouraged her to call me with any questions or concerns before her next visit to the cancer center; I would be happy to see her sooner, if needed.   2. Bone health:  Given Ms. Detzel's age, history of breast cancer, and her current anti-estrogen therapy with anastrozole, she is at risk for bone demineralization. We do not have records of baseline DEXA scan for her. Orders placed today and will try to coordinate this to be done with her mammogram.  Patient agreed with this plan.    3. Cancer screening:  Due to Ms. Seubert's history and her age, she should receive screening for skin cancers and colon cancers. She tells me that she will call her PCP about getting a colonoscopy scheduled.  She states that she was told that she does not need pap smears anymore.  She was offered the flu vaccine today, but states that she already has an appointment to get it through work next week.  She was encouraged to follow-up with her PCP for appropriate future cancer screenings/vaccinations.   4. Health maintenance and wellness promotion: Ms. Neuzil was encouraged to consume 5-7 servings of fruits and vegetables per day. She was also encouraged to engage in moderate to vigorous exercise for 30 minutes per day most days of the week. She was instructed to limit her alcohol consumption to 1 drink per day and continue to abstain from tobacco use.    Dispo:  -2017 annual mammogram due; orders placed today -Baseline DEXA scan due; orders placed today and will try to coordinate with mammogram -Return to cancer center to see Dr. Jana Hakim in  02/2017.    A total of 20 minutes of face-to-face time was spent with this patient with greater than 50% of that time in counseling and care-coordination.   Mike Craze, NP [facility] [phone]   Note: PRIMARY CARE PROVIDER Minerva Ends, [place] [phone]

## 2016-03-04 ENCOUNTER — Telehealth: Payer: Self-pay | Admitting: Family Medicine

## 2016-03-04 NOTE — Telephone Encounter (Signed)
Patient called the office to speak to nurse regarding her medication. She needs refill for her bp meds. Pt wanted to schedule appt but PCP is full for the remaining of the month. Will call on Monday to schedule an appt. Please follow up.  Thank you.

## 2016-03-09 ENCOUNTER — Other Ambulatory Visit: Payer: Self-pay | Admitting: Family Medicine

## 2016-03-09 DIAGNOSIS — I10 Essential (primary) hypertension: Secondary | ICD-10-CM

## 2016-03-09 MED FILL — HYDROCHLOROTHIAZIDE 12.5 MG: 12.5 | 30 days supply | Qty: 30 | Fill #0

## 2016-03-09 NOTE — Telephone Encounter (Signed)
meds refilled today

## 2016-03-10 ENCOUNTER — Ambulatory Visit
Admission: RE | Admit: 2016-03-10 | Discharge: 2016-03-10 | Disposition: A | Discharge status: Home / Self Care | Payer: Medicare Other | Source: Ambulatory Visit | Attending: Adult Health | Admitting: Adult Health

## 2016-03-10 DIAGNOSIS — Z79811 Long term (current) use of aromatase inhibitors: Secondary | ICD-10-CM

## 2016-03-10 DIAGNOSIS — Z780 Asymptomatic menopausal state: Secondary | ICD-10-CM | POA: Diagnosis not present

## 2016-03-10 DIAGNOSIS — R928 Other abnormal and inconclusive findings on diagnostic imaging of breast: Secondary | ICD-10-CM | POA: Diagnosis not present

## 2016-03-10 DIAGNOSIS — C50212 Malignant neoplasm of upper-inner quadrant of left female breast: Secondary | ICD-10-CM

## 2016-03-10 DIAGNOSIS — M85851 Other specified disorders of bone density and structure, right thigh: Secondary | ICD-10-CM | POA: Diagnosis not present

## 2016-03-11 ENCOUNTER — Telehealth: Payer: Self-pay | Admitting: *Deleted

## 2016-03-11 NOTE — Telephone Encounter (Signed)
Called pt to let her know the results of Dexa scan which showed that she's osteopenic. I explained to pt what this means and to help strengthen her bones that she needs to take a Vitamin D/Calcium supplement and do 30 minutes of moderate walking or any kind of aerobic weight bearing exercise she likes 4 to 5 times a week. Also I told her about eating more foods rich in D and calcium. Pt was agreeable to plan. No further  Concerns. Message to be fwd to [employer].

## 2016-04-07 ENCOUNTER — Other Ambulatory Visit: Payer: Self-pay | Admitting: Pharmacist

## 2016-04-07 DIAGNOSIS — I10 Essential (primary) hypertension: Secondary | ICD-10-CM

## 2016-04-07 MED ORDER — HYDROCHLOROTHIAZIDE 12.5 MG PO CAPS: 12.5000 mg | ORAL_CAPSULE | Freq: Every day | ORAL | 0 refills | Status: DC

## 2016-04-07 MED FILL — HYDROCHLOROTHIAZIDE 12.5 MG: 12.5 | 30 days supply | Qty: 30 | Fill #0

## 2016-04-07 MED FILL — ANASTROZOLE 1 MG TABLET: 1 | 90 days supply | Qty: 90 | Fill #0

## 2016-04-26 ENCOUNTER — Encounter: Payer: Self-pay | Admitting: Family Medicine

## 2016-04-26 ENCOUNTER — Ambulatory Visit: Payer: 59 | Attending: Family Medicine | Admitting: Family Medicine

## 2016-04-26 VITALS — BP 137/87 | HR 86 | Temp 98.5°F | Ht 63.0 in | Wt 162.8 lb

## 2016-04-26 DIAGNOSIS — N951 Menopausal and female climacteric states: Secondary | ICD-10-CM

## 2016-04-26 DIAGNOSIS — Z760 Encounter for issue of repeat prescription: Secondary | ICD-10-CM | POA: Insufficient documentation

## 2016-04-26 DIAGNOSIS — Z23 Encounter for immunization: Secondary | ICD-10-CM | POA: Diagnosis not present

## 2016-04-26 DIAGNOSIS — I10 Essential (primary) hypertension: Secondary | ICD-10-CM | POA: Diagnosis not present

## 2016-04-26 DIAGNOSIS — Z79899 Other long term (current) drug therapy: Secondary | ICD-10-CM | POA: Insufficient documentation

## 2016-04-26 DIAGNOSIS — Z853 Personal history of malignant neoplasm of breast: Secondary | ICD-10-CM | POA: Insufficient documentation

## 2016-04-26 DIAGNOSIS — N9419 Other specified dyspareunia: Secondary | ICD-10-CM | POA: Diagnosis not present

## 2016-04-26 MED ORDER — ZOSTER VACCINE LIVE 19400 UNT/0.65ML ~~LOC~~ SUSR: 0.6500 mL | Freq: Once | SUBCUTANEOUS | 0 refills | Status: AC

## 2016-04-26 MED ORDER — HYDROCHLOROTHIAZIDE 12.5 MG PO CAPS: 12.5000 mg | ORAL_CAPSULE | Freq: Every day | ORAL | 11 refills | Status: DC

## 2016-04-26 NOTE — Patient Instructions (Addendum)
Marissa Williams was seen today for medication refill.  Diagnoses and all orders for this visit:  Essential hypertension -     hydrochlorothiazide (MICROZIDE) 12.5 MG capsule; Take 1 capsule (12.5 mg total) by mouth daily.  Vaginal dryness, menopausal  Need for shingles vaccine -     Zoster Vaccine Live, PF, (ZOSTAVAX) 09811 UNT/0.65ML injection; Inject 19,400 Units into the skin once.   Normal blood work in September 2017, none needed today  I will be in touch oncologist recommendations regarding vaginal estrogen cream  F/u in 6 months for HTN   Dr. Dartha Lodge links are sponsored   Water  based lubricant options   K-Y Goodridge, 4 oz. $4.59 at [employer] pickup offered    1 other nearby store  10 product reviews  From the #1 Doctor recommended personal lubricant brand, K-Y Brand Jelly personal lubricant has a water based, fragrance free, non-greasy formula ...   Replens Moisture Restore External Comfort Gel - 1.5oz $9.49 at [employer]    Store pickup offered    2 nearby locations  2 product reviews  Replens  Gel  Vaginal Moisturizer  External Vaginal Dryness can become a problem for some women of menopausal age (due to depletion of the body's natural moisture). It can also be ...   Trojan H20 Closer Water-Based Personal Lubricant 5.5 Fl. Oz. Bottle $9.99 at [employer]    Store pickup offered    2 nearby locations  6 product reviews  Trojan? Lubricants H2O Closer? Personal    Varicella-Zoster Virus Vaccine Live injection What is this medicine? VARICELLA VIRUS VACCINE (var uh SEL uh VAHY ruhs vak SEEN) is used to prevent infections of chickenpox. HERPES ZOSTER VIRUS VACCINE (HUR peez ZOS ter vahy ruhs vak SEEN) is used to prevent shingles in adults 66 years old and over. This vaccine is not used to treat shingles or nerve pain from shingles. These medicines may be used for other purposes; ask your health care provider or  pharmacist if you have questions. This medicine may be used for other purposes; ask your health care provider or pharmacist if you have questions. What should I tell my health care provider before I take this medicine? They need to know if you have any of the following conditions: -blood disorders or disease -cancer like leukemia or lymphoma -immune system problems or therapy -infection with fever -recent immune globulin therapy -tuberculosis -an unusual or allergic reaction to vaccines, neomycin, gelatin, other medicines, foods, dyes, or preservatives -pregnant or trying to get pregnant -breast-feeding How should I use this medicine? These vaccines are for injection under the skin. They are given by a health care professional. A copy of Vaccine Information Statements will be given before each varicella virus vaccination. Read this sheet carefully each time. The sheet may change frequently. A Vaccine Information Statement is not given before the herpes zoster virus vaccine. Talk to your pediatrician regarding the use of the varicella virus vaccine in children. While this drug may be prescribed for children as young as 55 months of age for selected conditions, precautions do apply. The herpes zoster virus vaccine is not approved in children. Overdosage: If you think you have taken too much of this medicine contact a poison control center or emergency room at once. NOTE: This medicine is only for you. Do not share this medicine with others. What if I miss a dose? Keep appointments for follow-up (booster) doses of varicella virus vaccine as  directed. It is important not to miss your dose. Call your doctor or health care professional if you are unable to keep an appointment. Follow-up (booster) doses are not needed for the herpes zoster virus vaccine. What may interact with this medicine? Do not take these medicines with any of the following  medications: -adalimumab -anakinra -etanercept -infliximab -medicines that suppress your immune system -medicines to treat cancer These medicines may also interact with the following medications: -aspirin and aspirin-like medicines (varicella virus vaccine only) -blood transfusions (varicella virus vaccine only) -immunoglobulins (varicella virus vaccine only) -steroid medicines like prednisone or cortisone This list may not describe all possible interactions. Give your health care provider a list of all the medicines, herbs, non-prescription drugs, or dietary supplements you use. Also tell them if you smoke, drink alcohol, or use illegal drugs. Some items may interact with your medicine. What should I watch for while using this medicine? Visit your doctor for regular check ups. These vaccines, like all vaccines, may not fully protect everyone. After receiving these vaccines it may be possible to pass chickenpox infection to others. For up to 6 weeks, avoid people with immune system problems, pregnant women who have not had chickenpox, newborns of women who have not had chickenpox, and all newborns born at less than 28 weeks of pregnancy. Talk to your doctor for more information. Do not become pregnant for 3 months after taking these vaccines. Women should inform their doctor if they wish to become pregnant or think they might be pregnant. There is a potential for serious side effects to an unborn child. Talk to your health care professional or pharmacist for more information. What side effects may I notice from receiving this medicine? Side effects that you should report to your doctor or health care professional as soon as possible: -allergic reactions like skin rash, itching or hives, swelling of the face, lips, or tongue -breathing problems -extreme changes in behavior -feeling faint or lightheaded, falls -fever over 102 degrees F -pain, tingling, numbness in the hands or feet -redness,  blistering, peeling or loosening of the skin, including inside the mouth -seizures -unusually weak or tired Side effects that usually do not require medical attention (report to your doctor or health care professional if they continue or are bothersome): -aches or pains -chickenpox-like rash -diarrhea -headache -low-grade fever under 102 degrees F -loss of appetite -nausea, vomiting -redness, pain, swelling at site where injected -sleepy -trouble sleeping This list may not describe all possible side effects. Call your doctor for medical advice about side effects. You may report side effects to FDA at 1-800-FDA-1088. Where should I keep my medicine? These drugs are given in a hospital or clinic and will not be stored at home. NOTE: This sheet is a summary. It may not cover all possible information. If you have questions about this medicine, talk to your doctor, pharmacist, or health care provider.    2016, Elsevier/Gold Standard. (2013-02-02 14:24:35)

## 2016-04-26 NOTE — Progress Notes (Signed)
   Subjective:  Patient ID: Marissa Williams, female    DOB: Aug 06, 1950  Age: 64 y.o. MRN: [id number]  CC: Medication Refill   HPI Marissa Williams presents for    1. CHRONIC HYPERTENSION  Disease Monitoring  Blood pressure range: not checking   Chest pain: no   Dyspnea: no   Claudication: no   Medication compliance: yes, but ran out one week ago   Medication Side Effects  Lightheadedness: no   Urinary frequency: no   Edema: no     2. S/p breast cancer: she has completed all treatment. On arimidex for 5 year course. Has increased hot flashes, but feeling well overall. Eating gluten free and organic. She reports vaginal dryness and pain with intercourse. She is s/p hysterectomy.    Social History  Substance Use Topics  . Smoking status: Never Smoker  . Smokeless tobacco: Never Used  . Alcohol use Yes     Comment: occas    Outpatient Medications Prior to Visit  Medication Sig Dispense Refill  . anastrozole (ARIMIDEX) 1 MG tablet Take 1 tablet (1 mg total) by mouth daily. 90 tablet 4  . hydrochlorothiazide (MICROZIDE) 12.5 MG capsule Take 1 capsule (12.5 mg total) by mouth daily. 30 capsule 0   No facility-administered medications prior to visit.     ROS Review of Systems  Constitutional: Negative for chills and fever.  Eyes: Negative for visual disturbance.  Respiratory: Negative for shortness of breath.   Cardiovascular: Negative for chest pain.  Gastrointestinal: Negative for abdominal pain and blood in stool.  Endocrine:       Hot flashes   Genitourinary: Positive for dyspareunia.       Vaginal dryness   Musculoskeletal: Negative for arthralgias and back pain.  Skin: Negative for rash.  Allergic/Immunologic: Negative for immunocompromised state.  Hematological: Negative for adenopathy. Does not bruise/bleed easily.  Psychiatric/Behavioral: Negative for dysphoric mood and suicidal ideas.    Objective:  BP 137/87 (BP Location: Right Arm, Patient Position: Sitting,  Cuff Size: Small)   Pulse 86   Temp 98.5 F (36.9 C) (Oral)   Ht 5\' 3"  (1.6 m)   Wt 162 lb 12.8 oz (73.8 kg)   SpO2 98%   BMI 28.84 kg/m   BP/Weight 04/26/2016 03/01/2016 [id number]  Systolic BP 0000000 0000000 0000000  Diastolic BP 87 90 82  Wt. (Lbs) 162.8 158 156  BMI 28.84 27.99 27.64   Physical Exam  Constitutional: She is oriented to person, place, and time. She appears well-developed and well-nourished. No distress.  HENT:  Head: Normocephalic and atraumatic.  Cardiovascular: Normal rate, regular rhythm, normal heart sounds and intact distal pulses.   Pulmonary/Chest: Effort normal and breath sounds normal.  Musculoskeletal: She exhibits no edema.  Neurological: She is alert and oriented to person, place, and time.  Skin: Skin is warm and dry. No rash noted.  Psychiatric: She has a normal mood and affect.    Assessment & Plan:   [name] was seen today for medication refill.  Diagnoses and all orders for this visit:  Essential hypertension -     hydrochlorothiazide (MICROZIDE) 12.5 MG capsule; Take 1 capsule (12.5 mg total) by mouth daily.  Vaginal dryness, menopausal  Need for shingles vaccine -     Zoster Vaccine Live, PF, (ZOSTAVAX) 91478 UNT/0.65ML injection; Inject 19,400 Units into the skin once.   Follow-up: Return in about 6 months (around 10/24/2016) for HTN .   Boykin Nearing MD

## 2016-04-27 NOTE — Assessment & Plan Note (Signed)
Vaginal dryness with painful intercourse on Arimidex Patient advised to use water based lubricant  Vaginal estrogen cream is also an option, I will confer with her oncologist before initiating

## 2016-04-27 NOTE — Assessment & Plan Note (Signed)
Well-controlled. Continue HCTZ

## 2016-05-04 MED FILL — HYDROCHLOROTHIAZIDE 12.5 MG: 12.5 | 90 days supply | Qty: 90 | Fill #0

## 2016-06-22 MED FILL — ANASTROZOLE 1 MG TABLET: 1 | 90 days supply | Qty: 90 | Fill #1

## 2016-07-26 MED FILL — HYDROCHLOROTHIAZIDE 12.5 MG: 12.5 | 90 days supply | Qty: 90 | Fill #1

## 2016-08-10 ENCOUNTER — Telehealth: Payer: Self-pay | Admitting: Family Medicine

## 2016-08-10 DIAGNOSIS — M8580 Other specified disorders of bone density and structure, unspecified site: Secondary | ICD-10-CM

## 2016-08-10 NOTE — Telephone Encounter (Signed)
Patient called the office to speak with PCP in regards to getting a Vitamin D prescribed. Pt needs this medication because her bone density is low. Pt needs a small pill because she can't take the big ones. Please follow up. Please call it in to the Outpatient Pharmacy.   Thank you.

## 2016-08-11 ENCOUNTER — Other Ambulatory Visit: Payer: Self-pay

## 2016-08-11 NOTE — Telephone Encounter (Signed)
Vit D is not on medication list please follow up.

## 2016-08-12 DIAGNOSIS — M8580 Other specified disorders of bone density and structure, unspecified site: Secondary | ICD-10-CM | POA: Insufficient documentation

## 2016-08-12 MED ORDER — CALCIUM CITRATE 200 MG PO TABS
400.0000 mg | ORAL_TABLET | Freq: Every day | ORAL | 11 refills | Status: DC
Start: 2016-08-12 — End: 2017-02-16

## 2016-08-12 MED ORDER — VITAMIN D3 50 MCG (2000 UT) PO TABS: 2000.0000 [IU] | ORAL_TABLET | Freq: Every day | ORAL | 11 refills | Status: DC

## 2016-08-12 NOTE — Telephone Encounter (Signed)
Vit D sent to patient's pharmacy along with calcium

## 2016-09-23 ENCOUNTER — Other Ambulatory Visit: Payer: Self-pay | Admitting: Oncology

## 2016-09-23 MED FILL — ANASTROZOLE 1 MG TABLET: 1 | 90 days supply | Qty: 90 | Fill #0

## 2016-10-27 ENCOUNTER — Encounter: Payer: Self-pay | Admitting: Family Medicine

## 2016-10-27 MED FILL — HYDROCHLOROTHIAZIDE 12.5 MG: 12.5 | 90 days supply | Qty: 90 | Fill #2

## 2016-12-23 MED FILL — ANASTROZOLE 1 MG TABLET: 1 | 90 days supply | Qty: 90 | Fill #1

## 2017-01-12 ENCOUNTER — Encounter: Payer: Self-pay | Admitting: Podiatry

## 2017-01-12 ENCOUNTER — Ambulatory Visit (INDEPENDENT_AMBULATORY_CARE_PROVIDER_SITE_OTHER): Payer: 59 | Admitting: Podiatry

## 2017-01-12 VITALS — Ht 63.0 in | Wt 158.0 lb

## 2017-01-12 DIAGNOSIS — M216X1 Other acquired deformities of right foot: Secondary | ICD-10-CM | POA: Diagnosis not present

## 2017-01-12 DIAGNOSIS — M7742 Metatarsalgia, left foot: Secondary | ICD-10-CM

## 2017-01-12 DIAGNOSIS — M7741 Metatarsalgia, right foot: Secondary | ICD-10-CM | POA: Diagnosis not present

## 2017-01-12 DIAGNOSIS — R234 Changes in skin texture: Secondary | ICD-10-CM | POA: Diagnosis not present

## 2017-01-12 DIAGNOSIS — M21969 Unspecified acquired deformity of unspecified lower leg: Secondary | ICD-10-CM

## 2017-01-12 DIAGNOSIS — M216X2 Other acquired deformities of left foot: Secondary | ICD-10-CM

## 2017-01-12 NOTE — Patient Instructions (Signed)
Seen for painful feet. Has deep penetrating eschar induced callus under the left 5th metatarsal bone with pain upon ambulation. All calluses debrided. Both feet casted for Orthotics.

## 2017-01-12 NOTE — Progress Notes (Signed)
SUBJECTIVE: 66 y.o. year old female presents for pain in left foot. But both feet hurt at the end of the day. Pain under the ball of left foot lesion, which was formed after an injury 20 years ago stepping on a glass. This was removed again  REVIEW OF SYSTEMS: A comprehensive review of systems was negative except for: Hx of breast cancer and lumpectomy followed by Radiation treatment.  OBJECTIVE: DERMATOLOGIC EXAMINATION: Severe eschar under 5th metatarsal bone left foot with deep penetrating callus.  Severe digital corn 5th left > right.  VASCULAR EXAMINATION OF LOWER LIMBS: All pedal pulses are palpable with normal pulsation.  Capillary Filling times within 3 seconds in all digits.  No edema or erythema noted. Temperature gradient from tibial crest to dorsum of foot is within normal bilateral.  NEUROLOGIC EXAMINATION OF THE LOWER LIMBS: All epicritic and tactile sensations grossly intact. Sharp and Dull discriminatory sensations at the plantar ball of hallux is intact bilateral.   MUSCULOSKELETAL EXAMINATION: Severe hammer toe 5th L>R.  RADIOGRAPHIC STUDIES:  General overview: Negative of Osteopenia Absence of soft tissue swelling. Absence of acute osseous or articular surfaces of forefoot or rearfoot.  AP View:  Short first metatarsal bilateral. Lateral view:  No gross deformities, no foreign body at corresponding area sub 5 left.  ASSESSMENT: Metatarsalgia bilateral. Deformed metatarsal bilateral. Eschar, porokeratosis Pain both feet.  PLAN: Reviewed clinical findings and available treatment options. All lesions debrided. Both feet casted for orthotics.

## 2017-01-16 IMAGING — MG MM DIAG BREAST TOMO UNI LEFT
6 series · 7 of 14 positions shown · non-contrast
Comparison: Previous exam(s).

CLINICAL DATA: Palpable lump upper inner left breast in the [DATE]
to 11 o'clock position.

EXAM:
DIGITAL DIAGNOSTIC LEFT MAMMOGRAM WITH 3D TOMOSYNTHESIS WITH CAD
ULTRASOUND LEFT BREAST

[L LMO]
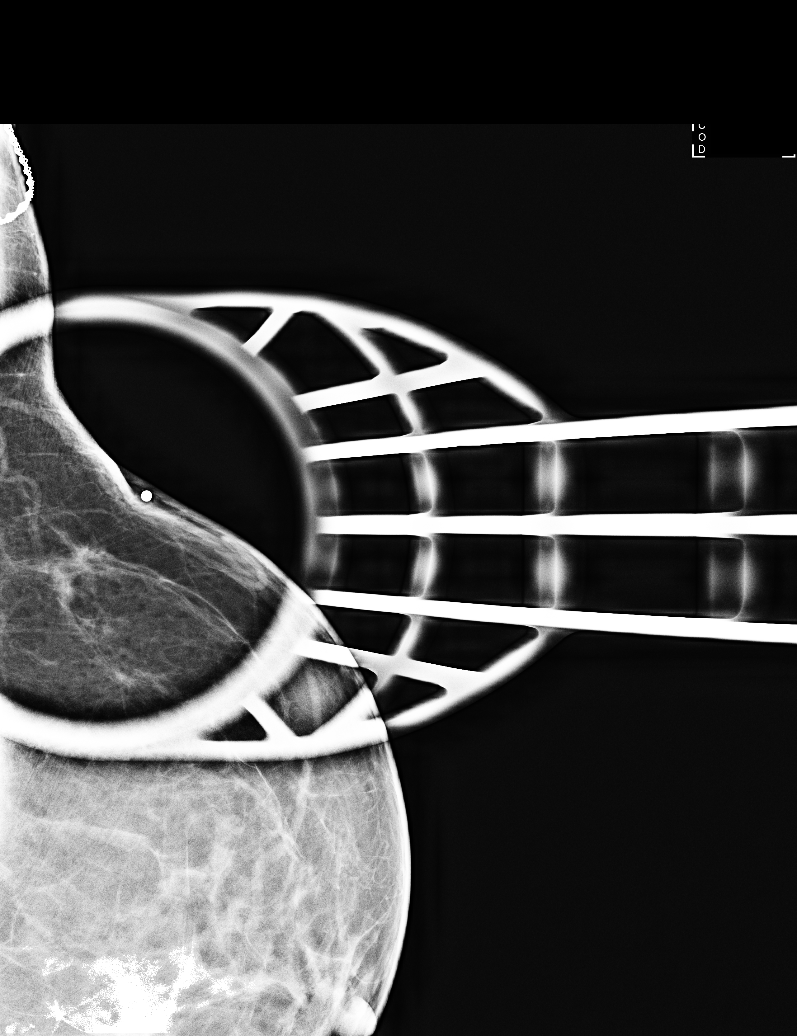

[L CC]
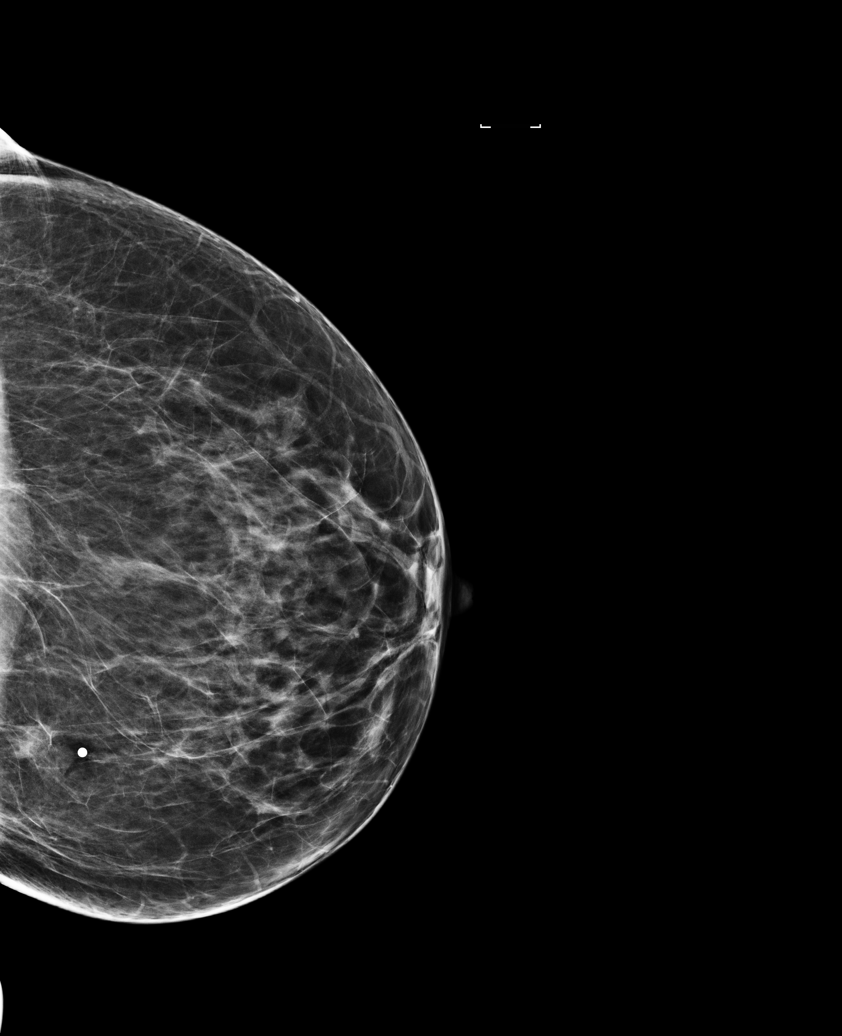

[L MLO]
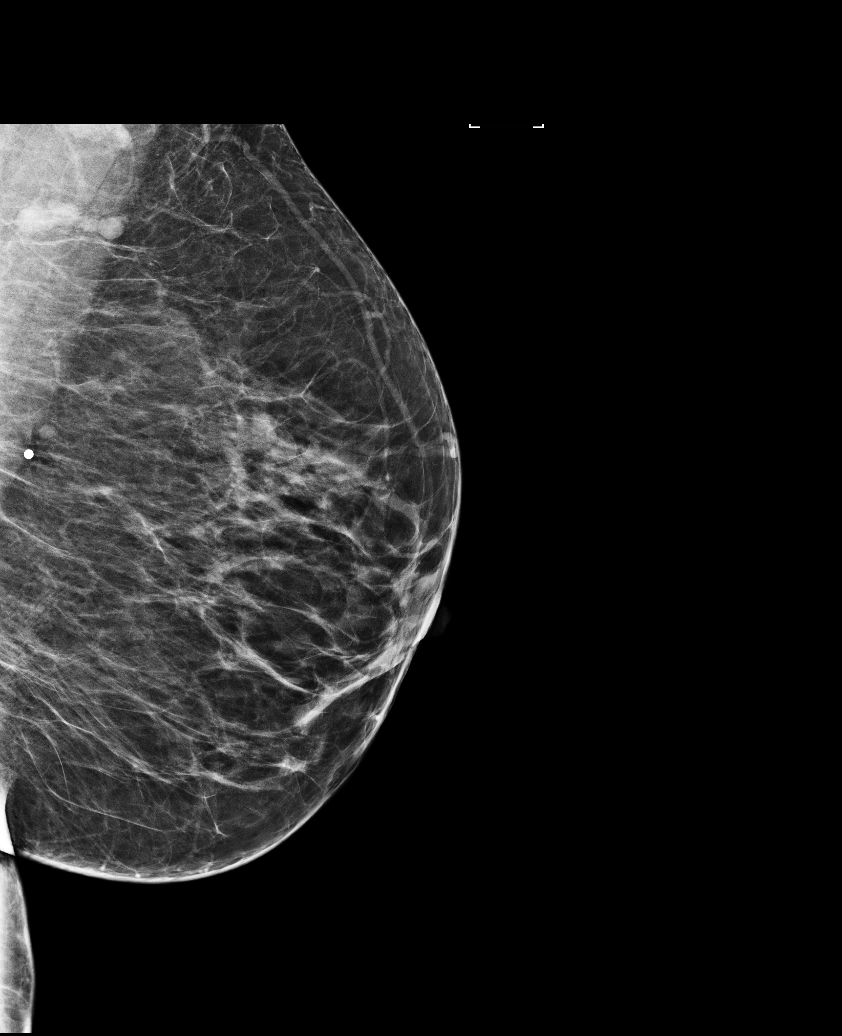

[L MLO tomo (1 of 2)]
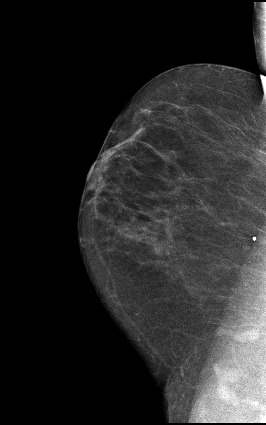

[L MLO tomo · 2 of 66 frames shown (2 of 2)]
[frame 22/66]
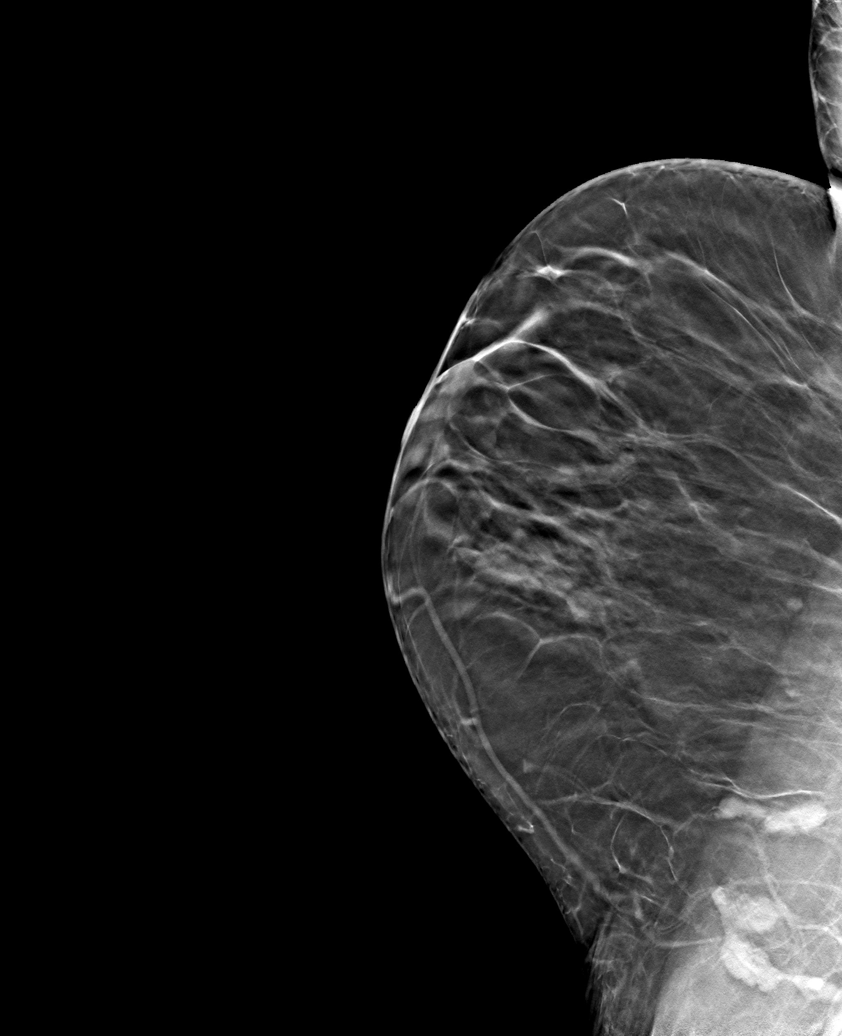
[frame 33/66]
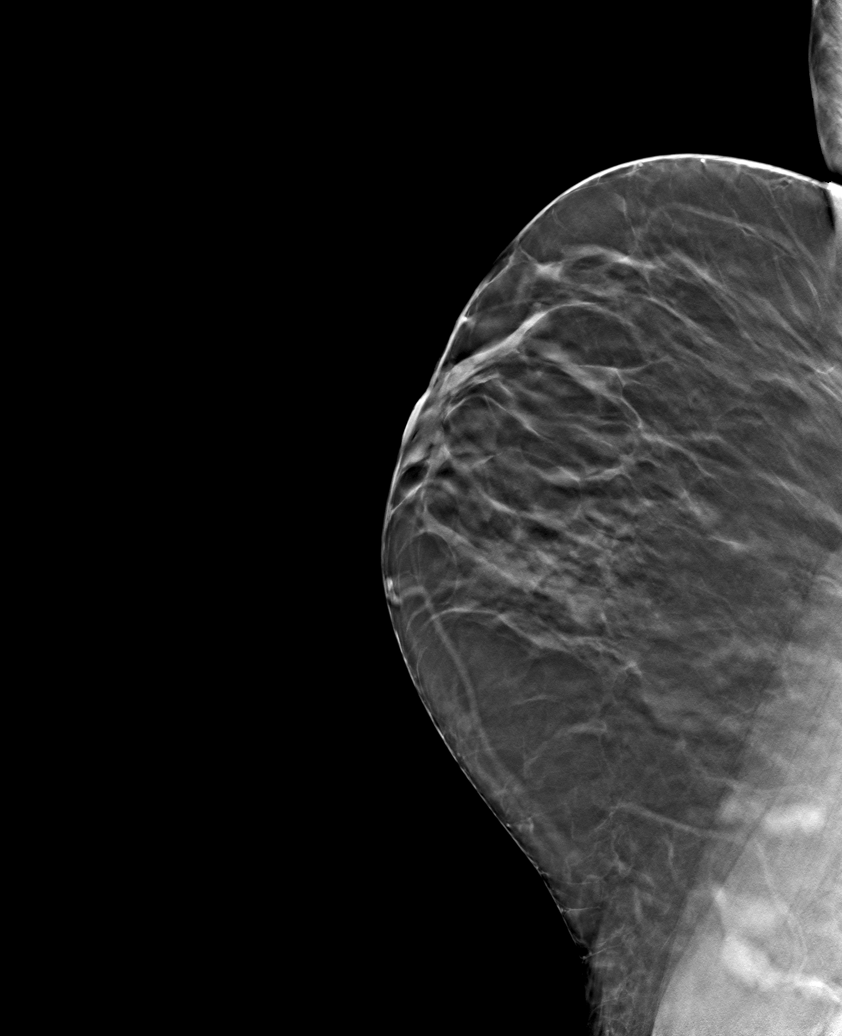

[L CC tomo · tomo slice 32/63.0]
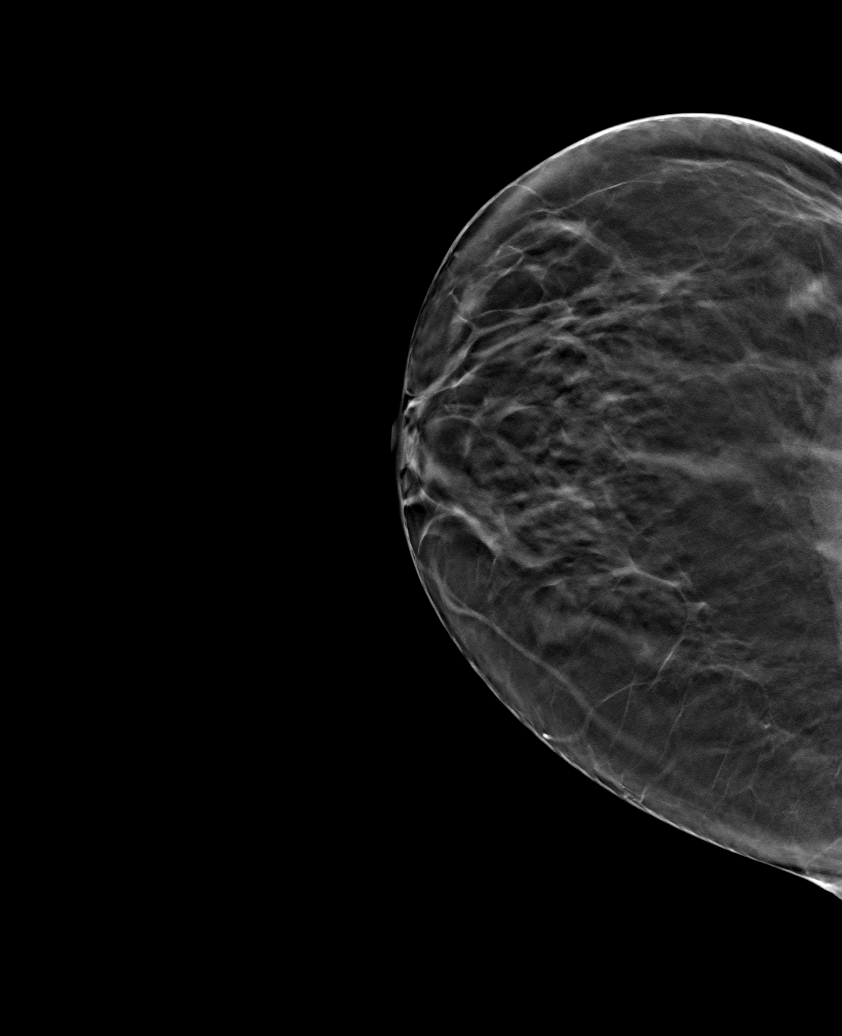

[7 of 14 positions shown; findings below may reference images not displayed]

ACR Breast Density Category b: There are scattered areas of
fibroglandular density.
FINDINGS: Metallic skin marker was placed site of patient concern in the upper
inner left breast. Best seen in the CC projection and in the spot
tangential view is a spiculated mass in the region of the palpable
lump that measures approximately 10 mm on mammography. In the MLO
projection, the anterior spiculations are visible, but the dominant
portion of the mass is posterior to the imaging field.

Mammographic images were processed with CAD.

On physical exam, there is a palpable lump in the [DATE] position of
the left breast approximately 8-9 cm from the nipple.

Targeted ultrasound is performed, showing an irregular hypoechoic
mass with angular margins and posterior acoustic shadowing that
measures 6 x 7 x 9 mm. No internal vascular flow is appreciated.
This mass abuts the pectoralis muscle, but does not appear to invade
it. Ultrasound of the left axilla shows normal appearing axillary
lymph nodes.
IMPRESSION: 9 mm spiculated mass upper-inner quadrant left breast accounting for
the palpable lump. This mass is suspicious for malignancy.

RECOMMENDATION:
Ultrasound-guided biopsy is suggested. The patient can stay today
for biopsy. Procedure was discussed in detail with the patient and
an order was obtained for the biopsy, which will be dictated
separately.

I have discussed the findings and recommendations with the patient.
Results were also provided in writing at the conclusion of the
visit. If applicable, a reminder letter will be sent to the patient
regarding the next appointment.

BI-RADS CATEGORY  5: Highly suggestive of malignancy.

## 2017-01-16 IMAGING — US US BREAST LTD UNI LEFT INC AXILLA
1 series · 13 of 16 positions shown · non-contrast
Comparison: Previous exam(s).

CLINICAL DATA: Palpable lump upper inner left breast in the [DATE]
to 11 o'clock position.

EXAM:
DIGITAL DIAGNOSTIC LEFT MAMMOGRAM WITH 3D TOMOSYNTHESIS WITH CAD
ULTRASOUND LEFT BREAST

[Series 1: us breast ltd uni left inc axilla · 0.07mm/px · 13 of 16 slices shown]
[im 1/16]
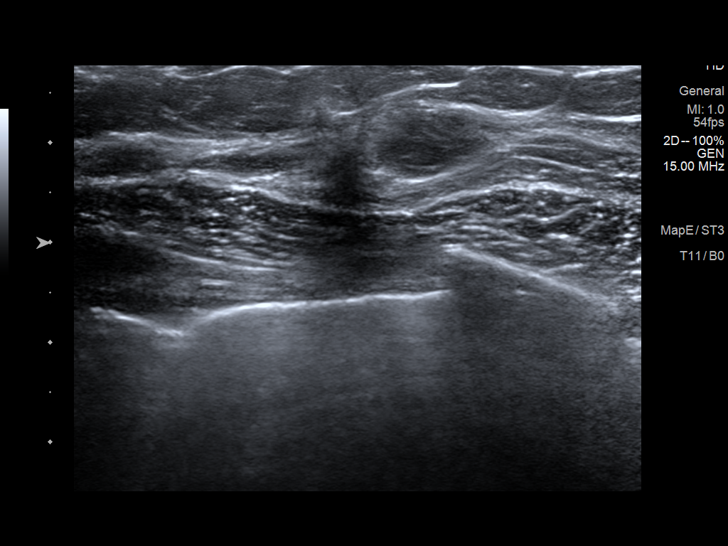
[im 2/16]
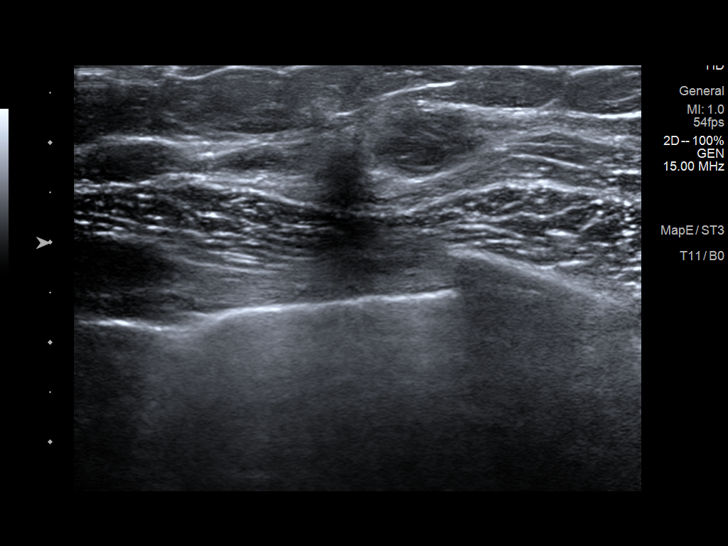
[im 4/16]
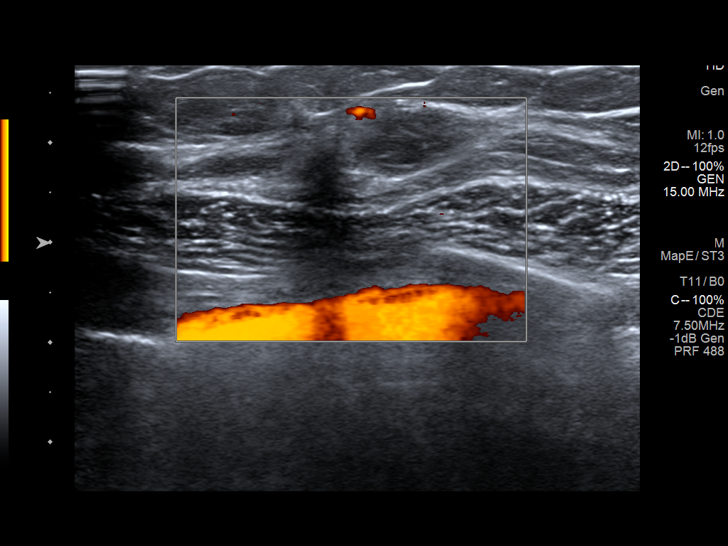
[im 5/16]
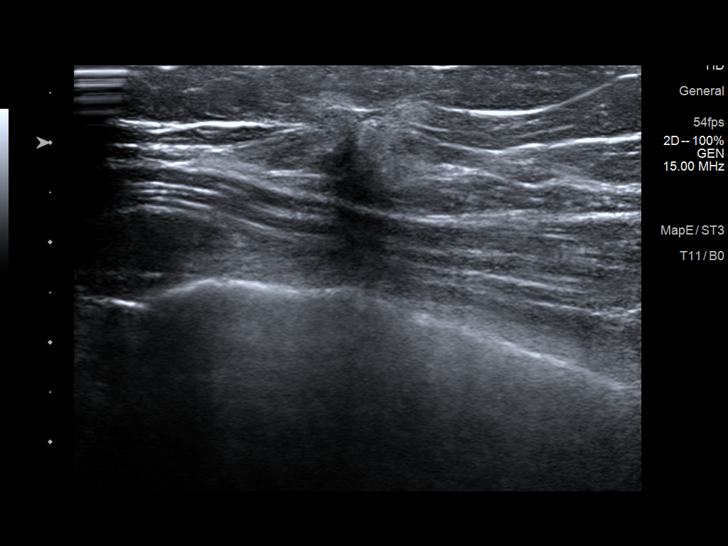
[im 6/16]
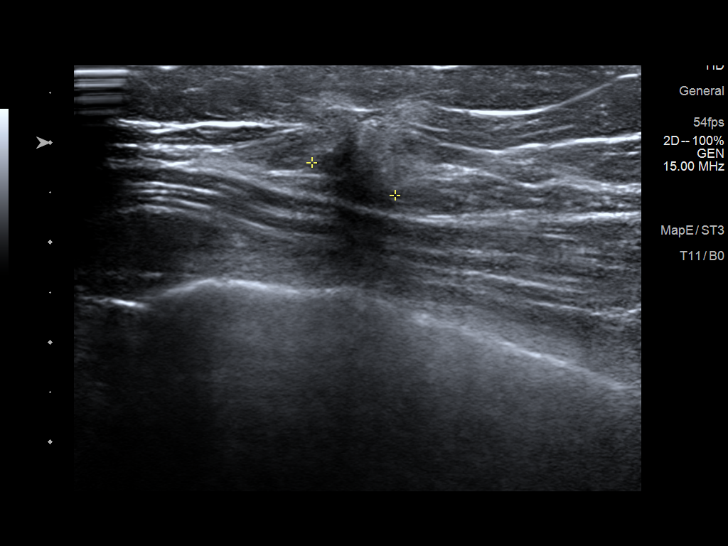
[im 7/16]
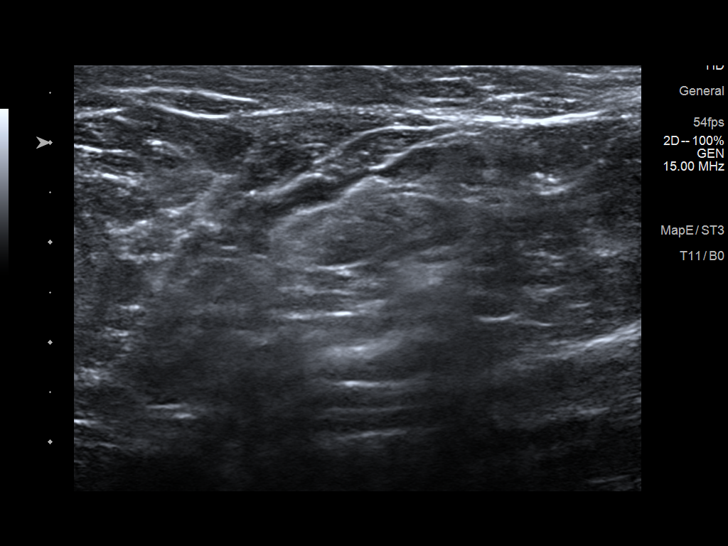
[im 9/16]
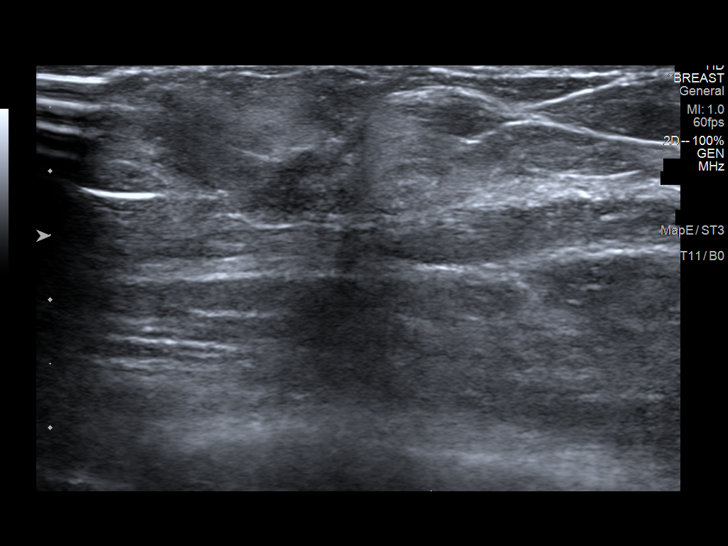
[im 10/16]
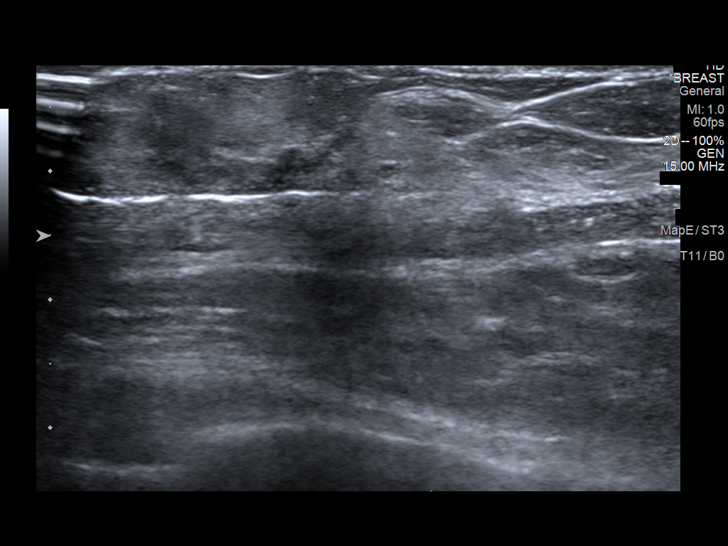
[im 11/16]
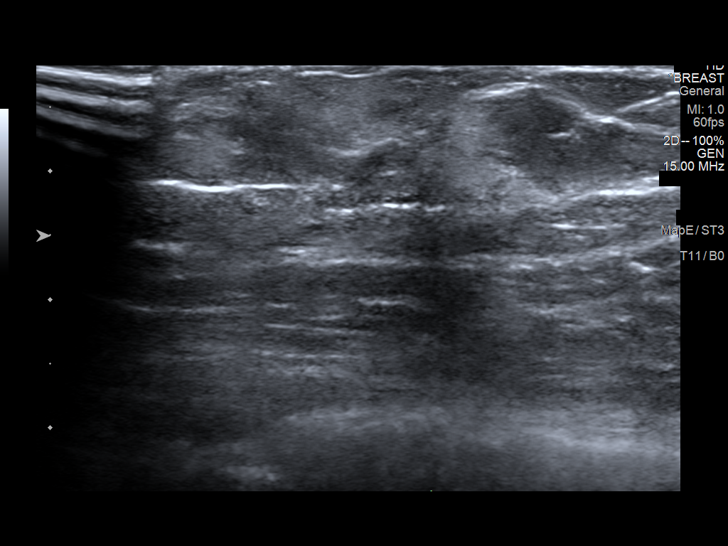
[im 12/16]
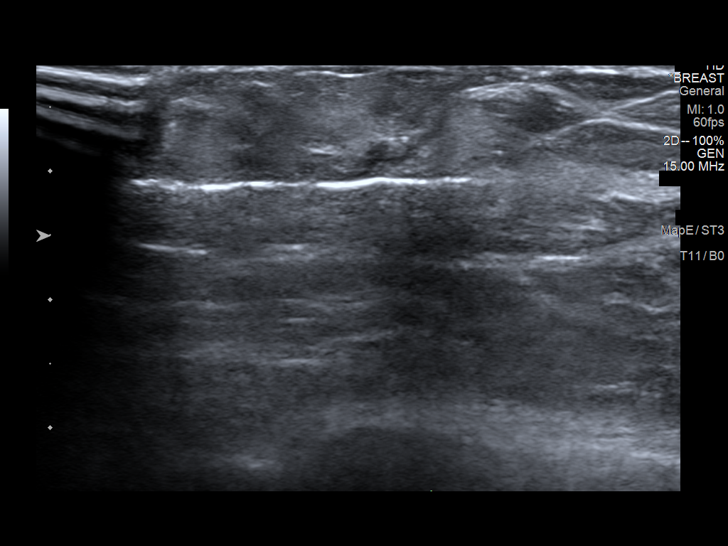
[im 13/16]
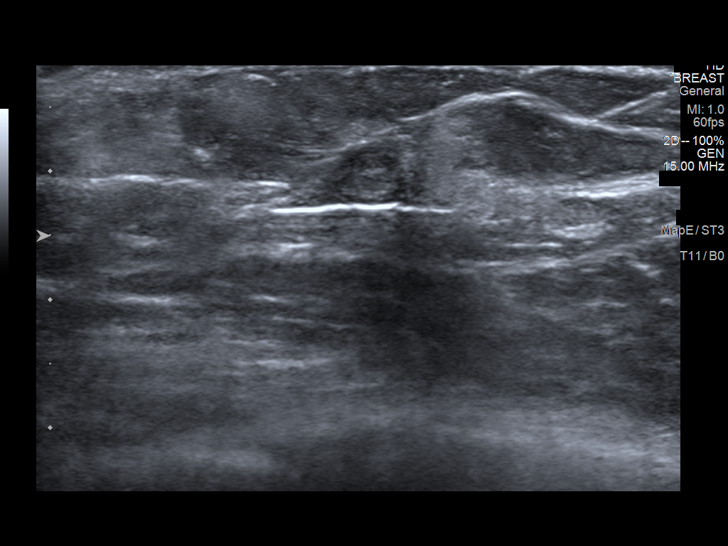
[im 15/16]
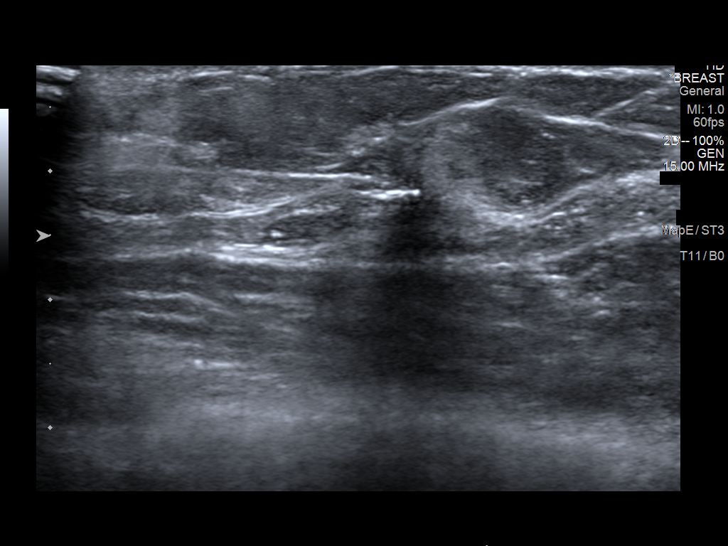
[im 16/16]
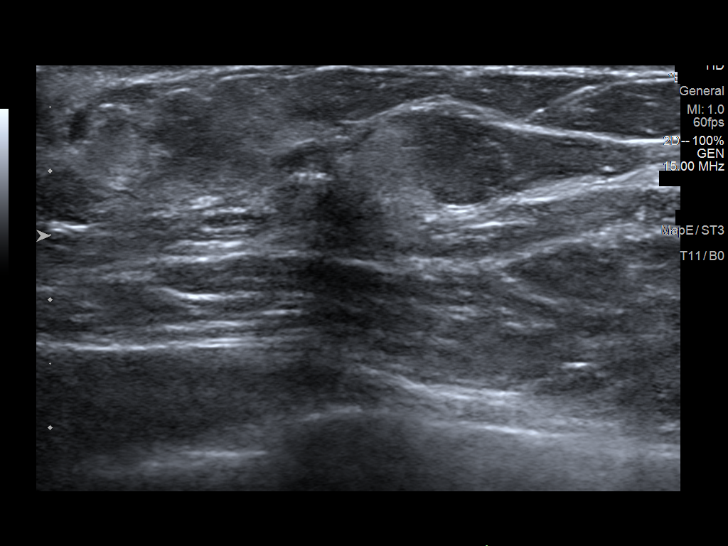

[13 of 16 positions shown; findings below may reference images not displayed]

ACR Breast Density Category b: There are scattered areas of
fibroglandular density.
FINDINGS: Metallic skin marker was placed site of patient concern in the upper
inner left breast. Best seen in the CC projection and in the spot
tangential view is a spiculated mass in the region of the palpable
lump that measures approximately 10 mm on mammography. In the MLO
projection, the anterior spiculations are visible, but the dominant
portion of the mass is posterior to the imaging field.

Mammographic images were processed with CAD.

On physical exam, there is a palpable lump in the [DATE] position of
the left breast approximately 8-9 cm from the nipple.

Targeted ultrasound is performed, showing an irregular hypoechoic
mass with angular margins and posterior acoustic shadowing that
measures 6 x 7 x 9 mm. No internal vascular flow is appreciated.
This mass abuts the pectoralis muscle, but does not appear to invade
it. Ultrasound of the left axilla shows normal appearing axillary
lymph nodes.
IMPRESSION: 9 mm spiculated mass upper-inner quadrant left breast accounting for
the palpable lump. This mass is suspicious for malignancy.

RECOMMENDATION:
Ultrasound-guided biopsy is suggested. The patient can stay today
for biopsy. Procedure was discussed in detail with the patient and
an order was obtained for the biopsy, which will be dictated
separately.

I have discussed the findings and recommendations with the patient.
Results were also provided in writing at the conclusion of the
visit. If applicable, a reminder letter will be sent to the patient
regarding the next appointment.

BI-RADS CATEGORY  5: Highly suggestive of malignancy.

## 2017-01-16 IMAGING — MG MM DIAGNOSTIC UNILATERAL L
2 series · 2 of 2 positions shown · non-contrast
Comparison: Previous exam(s).

CLINICAL DATA: Ultrasound-guided biopsy of a palpable suspicious
mass in the upper inner quadrant of the left breast was performed
today.

EXAM:
DIAGNOSTIC LEFT MAMMOGRAM POST ULTRASOUND BIOPSY

[L CC]
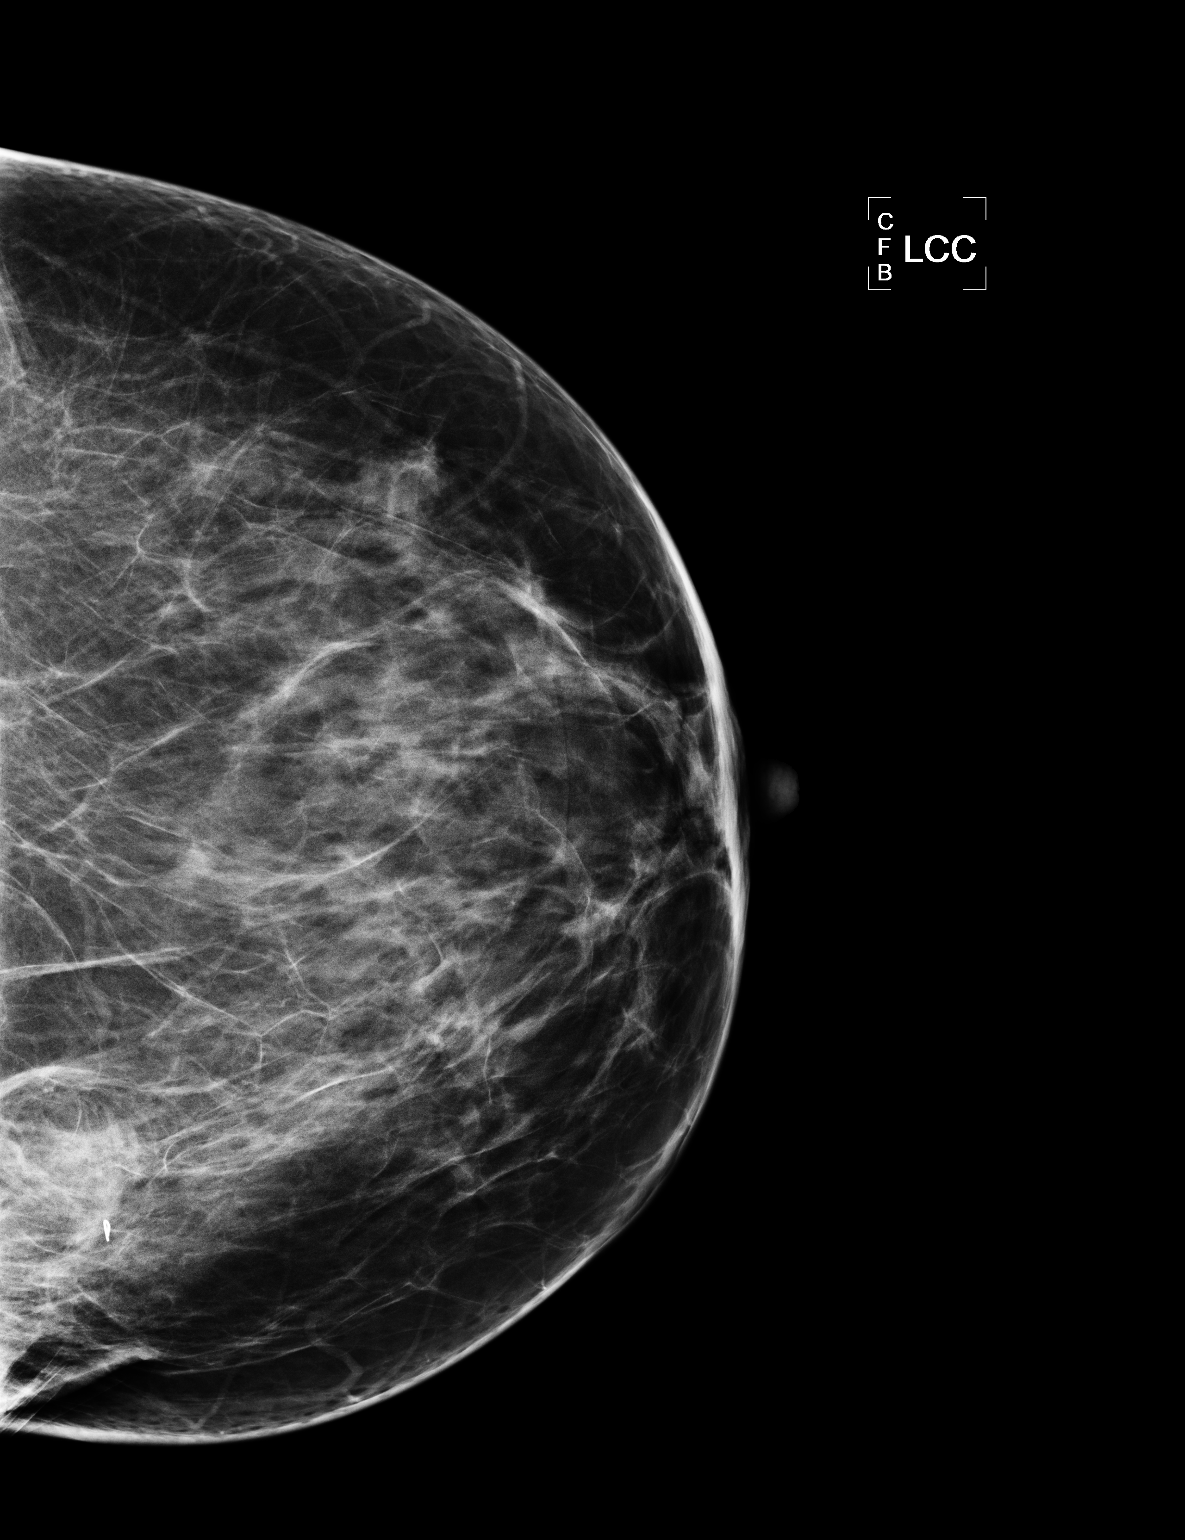

[L ML]
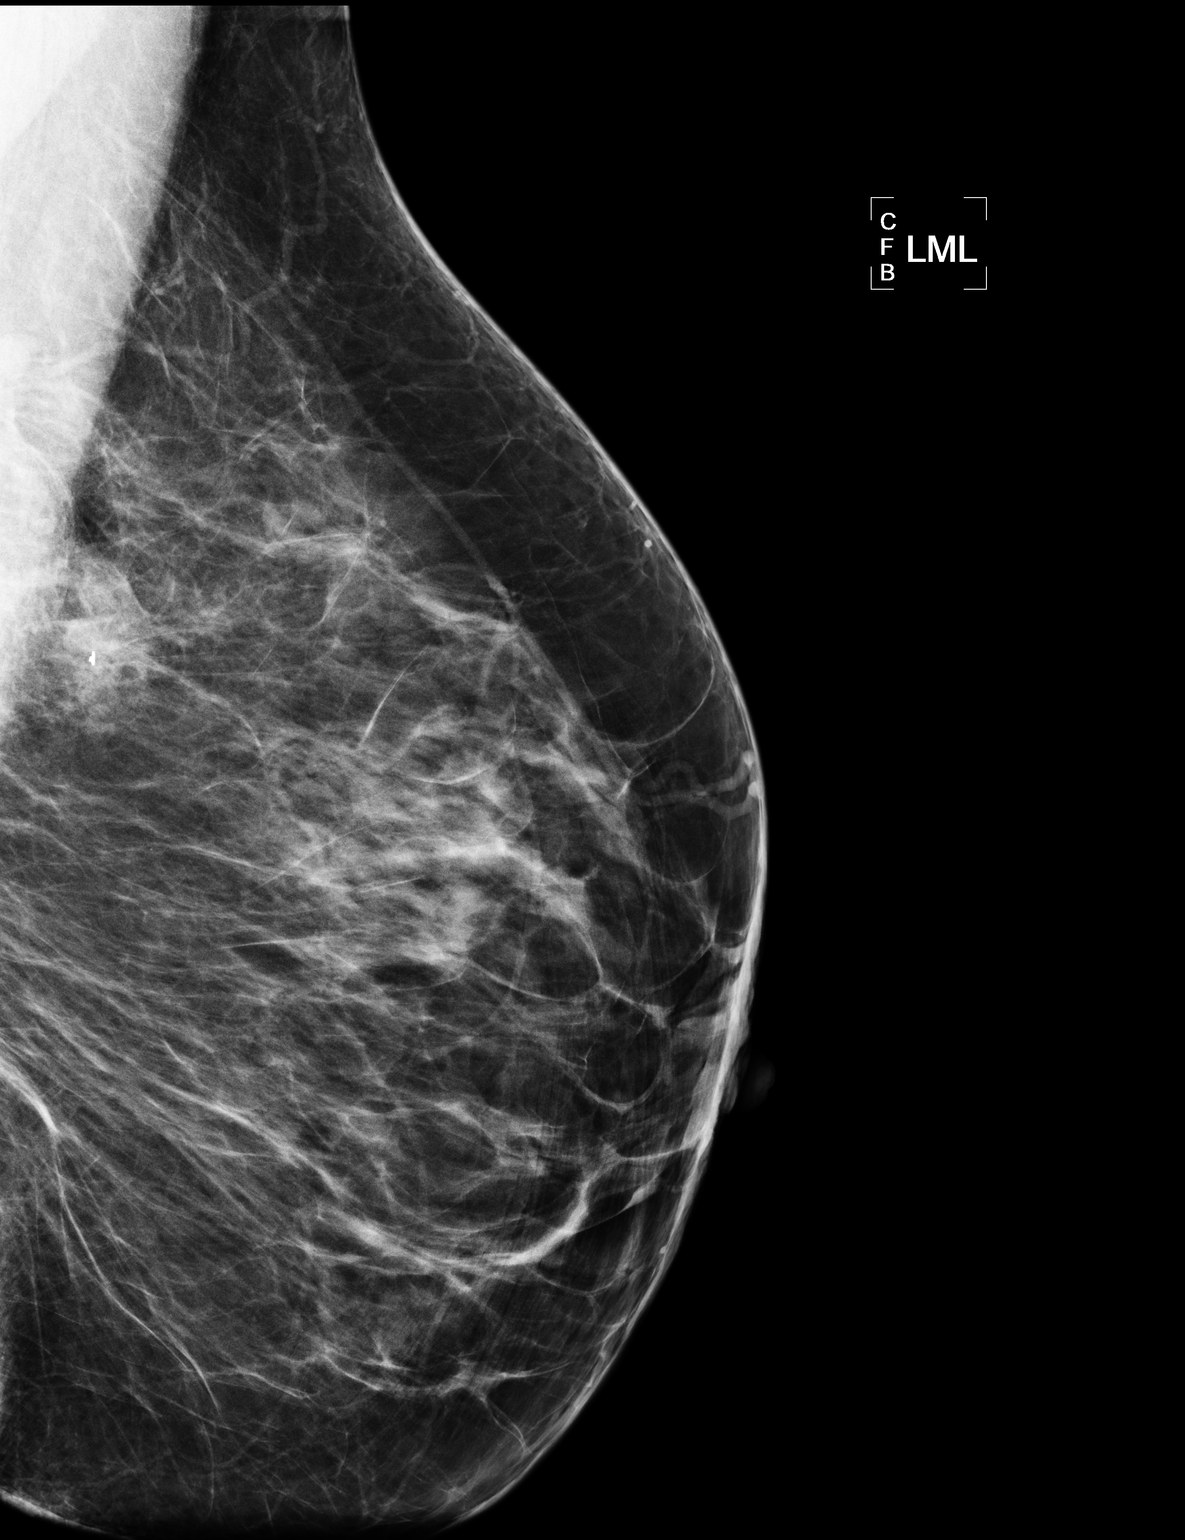

[2 of 2 positions shown; findings below may reference images not displayed]

FINDINGS: Mammographic images were obtained following ultrasound guided biopsy
of the spiculated palpable mass in the [DATE] position of the left
breast, approximately 8-9 cm from the nipple. A ribbon shaped biopsy
clip is satisfactorily positioned within the mass. Biopsy changes
are noted adjacent to the mass.
IMPRESSION: Satisfactory position of ribbon shaped biopsy clip.

Final Assessment: Post Procedure Mammograms for Marker Placement

## 2017-01-26 ENCOUNTER — Ambulatory Visit: Payer: 59 | Admitting: Podiatry

## 2017-01-27 MED FILL — HYDROCHLOROTHIAZIDE 12.5 MG: 12.5 | 90 days supply | Qty: 90 | Fill #3

## 2017-02-15 ENCOUNTER — Ambulatory Visit (HOSPITAL_COMMUNITY)
Admission: EM | Admit: 2017-02-15 | Discharge: 2017-02-15 | Disposition: A | Discharge status: Home / Self Care | Payer: 59 | Attending: Family Medicine | Admitting: Family Medicine

## 2017-02-15 ENCOUNTER — Encounter (HOSPITAL_COMMUNITY): Payer: Self-pay | Admitting: Emergency Medicine

## 2017-02-15 DIAGNOSIS — R42 Dizziness and giddiness: Secondary | ICD-10-CM

## 2017-02-15 DIAGNOSIS — I10 Essential (primary) hypertension: Secondary | ICD-10-CM | POA: Diagnosis not present

## 2017-02-15 MED ORDER — HYDROCHLOROTHIAZIDE 25 MG PO TABS: 25.0000 mg | ORAL_TABLET | Freq: Every day | ORAL | 1 refills | Status: DC

## 2017-02-15 MED FILL — HYDROCHLOROTHIAZIDE 25 MG T: 25 | 30 days supply | Qty: 30 | Fill #0

## 2017-02-15 NOTE — ED Provider Notes (Signed)
[facility]   [id number] 02/15/17 Arrival Time: 1000  ASSESSMENT & PLAN:  1. Essential hypertension   2. Intermittent lightheadedness     Meds ordered this encounter  Medications  . hydrochlorothiazide (HYDRODIURIL) 25 MG tablet    Sig: Take 1 tablet (25 mg total) by mouth daily.    Dispense:  30 tablet    Refill:  1   Desires to increase HCTZ to 25mg . Will call her PCP today and schedule f/u appointment. May f/u here as needed. Normal exam. I am not sure what exactly is causing her lightheaded feeling. She will monitor closely. Ensure adequate fluid intake. Work note given for today.  Reviewed expectations re: course of current medical issues. Questions answered. Outlined signs and symptoms indicating need for more acute intervention. Patient verbalized understanding. After Visit Summary given.   SUBJECTIVE:  Marissa Williams is a 66 y.o. female who presents for a BP check. Has noticed slight elevation from her normal blood pressures the past several days. Taken at work today 160s/100. Reports episodic and mild "dizzy feeling" for several days. No worsening. Dizziness described as lightheadedness. No vertigo. No HA or visual changes. No dysequilibrium. No extremity weakness or sensation changes. No BP or SOB. No recent illnesses. Took one of her friend's BP meds this morning when she saw elevated pressure. Thinks it was amlodipine 10mg . No current lightheadedness here. No OTC medications recently. Sleeping well. Normal appetite. No n/v. Symptoms have not limited her.  ROS: As per HPI. All other systems negative.   OBJECTIVE:  Vitals:   02/15/17 1011  BP: (!) 149/93  Pulse: 98  Resp: 18  Temp: 98.2 F (36.8 C)  TempSrc: Oral  SpO2: 99%    General appearance: alert; no distress Eyes: PERRLA; EOM HENT: normocephalic; atraumatic; TMs normal Lungs: clear to auscultation bilaterally Heart: regular rate and rhythm Extremities: no cyanosis or edema; symmetrical with no  gross deformities Skin: warm and dry Neurologic: normal gait; normal symmetric reflexes; normal movement of all extremities Psychological: alert and cooperative; normal mood and affect  No Known Allergies  Past Medical History:  Diagnosis Date  . Breast cancer of upper-inner quadrant of left female breast ([place]) 01/27/2015  . Hypertension Dx 2008  . Radiation 03/19/15-05/02/15   left breast   Social History   Social History  . Marital status: Legally Separated    Spouse name: N/A  . Number of children: 4  . Years of education: 9    Occupational History  . Student      [employer]  . [place] History Main Topics  . Smoking status: Never Smoker  . Smokeless tobacco: Never Used  . Alcohol use Yes     Comment: occas  . Drug use: No     Comment: in the past cocaine. none since 2010.   Marland Kitchen Sexual activity: Not on file   Other Topics Concern  . Not on file   Social History Narrative   Had 4 children.    3 living.   One child died at 8 months, SIDs.    Lives alone.    Children in town.       Family History  Problem Relation Age of Onset  . Alzheimer's disease Father   . Cancer Mother        throat cancer- smoker and ETOH   . Heart disease Neg Hx   . Diabetes Neg Hx    Past Surgical  History:  Procedure Laterality Date  . ABDOMINAL HYSTERECTOMY  1980   uterine prolapse   . BREAST LUMPECTOMY WITH RADIOACTIVE SEED AND SENTINEL LYMPH NODE BIOPSY Left 02/06/2015   Procedure: LEFT BREAST LUMPECTOMY WITH RADIOACTIVE SEED AND LEFT AXILLARY SENTINEL LYMPH NODE BIOPSY;  Surgeon: Excell Seltzer, MD;  Location: [facility];  Service: General;  Laterality: Left;  . TUBAL LIGATION  1970     Vanessa Kick, MD 02/15/17 1032

## 2017-02-15 NOTE — ED Notes (Addendum)
Patient says she took a friends bp pill around 9:00am.  Possibly amlodipine 10 mg

## 2017-02-15 NOTE — ED Triage Notes (Addendum)
Complaints of feeling dizzy. Dizziness for 3-4 days. Patient takes blood pressure medicine every day, regularly.  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  Reports nurse at work reported a bp of 160/100 today.  Patient also has a slight headache

## 2017-02-16 ENCOUNTER — Ambulatory Visit (INDEPENDENT_AMBULATORY_CARE_PROVIDER_SITE_OTHER): Payer: 59 | Admitting: Family Medicine

## 2017-02-16 ENCOUNTER — Encounter: Payer: Self-pay | Admitting: Family Medicine

## 2017-02-16 VITALS — BP 130/80 | Ht 63.0 in | Wt 156.4 lb

## 2017-02-16 DIAGNOSIS — M8580 Other specified disorders of bone density and structure, unspecified site: Secondary | ICD-10-CM | POA: Diagnosis not present

## 2017-02-16 DIAGNOSIS — R739 Hyperglycemia, unspecified: Secondary | ICD-10-CM | POA: Diagnosis not present

## 2017-02-16 DIAGNOSIS — Z13220 Encounter for screening for lipoid disorders: Secondary | ICD-10-CM

## 2017-02-16 DIAGNOSIS — I10 Essential (primary) hypertension: Secondary | ICD-10-CM

## 2017-02-16 DIAGNOSIS — R42 Dizziness and giddiness: Secondary | ICD-10-CM | POA: Diagnosis not present

## 2017-02-16 LAB — COMPREHENSIVE METABOLIC PANEL
ALT: 13 U/L (ref 0–35)
AST: 16 U/L (ref 0–37)
Albumin: 4.7 g/dL (ref 3.5–5.2)
Alkaline Phosphatase: 60 U/L (ref 39–117)
BILIRUBIN TOTAL: 0.6 mg/dL (ref 0.2–1.2)
BUN: 13 mg/dL (ref 6–23)
CO2: 30 mEq/L (ref 19–32)
Calcium: 10.3 mg/dL (ref 8.4–10.5)
Chloride: 98 mEq/L (ref 96–112)
Creatinine, Ser: 0.7 mg/dL (ref 0.40–1.20)
GFR: 107.35 mL/min (ref >60.00)
GLUCOSE: 105 mg/dL — AB (ref 70–99)
Potassium: 4.5 mEq/L (ref 3.5–5.1)
SODIUM: 138 meq/L (ref 135–145)
TOTAL PROTEIN: 7.8 g/dL (ref 6.0–8.3)

## 2017-02-16 LAB — LIPID PANEL
CHOL/HDL RATIO: 4
Cholesterol: 198 mg/dL (ref 0–200)
HDL: 55.2 mg/dL (ref >39.00)
LDL CALC: 128 mg/dL — AB (ref 0–99)
NonHDL: 142.42
Triglycerides: 73 mg/dL (ref 0.0–149.0)
VLDL: 14.6 mg/dL (ref 0.0–40.0)

## 2017-02-16 LAB — VITAMIN D 25 HYDROXY (VIT D DEFICIENCY, FRACTURES): VITD: 70.56 ng/mL (ref 30.00–100.00)

## 2017-02-16 LAB — TSH: TSH: 1.55 u[IU]/mL (ref 0.35–4.50)

## 2017-02-16 LAB — CBC
HCT: 42.2 % (ref 36.0–46.0)
Hemoglobin: 14.3 g/dL (ref 12.0–15.0)
MCHC: 33.9 g/dL (ref 30.0–36.0)
MCV: 84.9 fl (ref 78.0–100.0)
Platelets: 328 10*3/uL (ref 150.0–400.0)
RBC: 4.97 Mil/uL (ref 3.87–5.11)
RDW: 13.6 % (ref 11.5–15.5)
WBC: 7.6 10*3/uL (ref 4.0–10.5)

## 2017-02-16 LAB — HEMOGLOBIN A1C: Hgb A1c MFr Bld: 5.1 % (ref 4.6–6.5)

## 2017-02-16 MED ORDER — CALCIUM/VITAMIN D 600-400 MG-UNIT PO TABS: 2.0000 | ORAL_TABLET | Freq: Every day | ORAL | 11 refills | Status: DC

## 2017-02-16 NOTE — Patient Instructions (Addendum)
Your blood pressure was normal today.  We will check blood work.  I sent in a prescription for calcium-vitamin D. Please discuss with the pharmacist which tablet is appropriate for you. You should be taking at least '1200mg'$  of calcium and 800 units of vitamin D daily.  Your goal blood pressure is 140/90.  Come back in 6-12 months for your next check up, or sooner as needed.  You will due for your next bone density scan in 2019.   Take care,  Dr Jerline Pain   Preventive Care 23 Years and Older, Female Preventive care refers to lifestyle choices and visits with your health care provider that can promote health and wellness. What does preventive care include?  A yearly physical exam. This is also called an annual well check.  Dental exams once or twice a year.  Routine eye exams. Ask your health care provider how often you should have your eyes checked.  Personal lifestyle choices, including: ? Daily care of your teeth and gums. ? Regular physical activity. ? Eating a healthy diet. ? Avoiding tobacco and drug use. ? Limiting alcohol use. ? Practicing safe sex. ? Taking low-dose aspirin every day. ? Taking vitamin and mineral supplements as recommended by your health care provider. What happens during an annual well check? The services and screenings done by your health care provider during your annual well check will depend on your age, overall health, lifestyle risk factors, and family history of disease. Counseling Your health care provider may ask you questions about your:  Alcohol use.  Tobacco use.  Drug use.  Emotional well-being.  Home and relationship well-being.  Sexual activity.  Eating habits.  History of falls.  Memory and ability to understand (cognition).  Work and work Statistician.  Reproductive health.  Screening You may have the following tests or measurements:  Height, weight, and BMI.  Blood pressure.  Lipid and cholesterol levels. These  may be checked every 5 years, or more frequently if you are over 67 years old.  Skin check.  Lung cancer screening. You may have this screening every year starting at age 67 if you have a 30-pack-year history of smoking and currently smoke or have quit within the past 15 years.  Fecal occult blood test (FOBT) of the stool. You may have this test every year starting at age 67.  Flexible sigmoidoscopy or colonoscopy. You may have a sigmoidoscopy every 5 years or a colonoscopy every 10 years starting at age 67.  Hepatitis C blood test.  Hepatitis B blood test.  Sexually transmitted disease (STD) testing.  Diabetes screening. This is done by checking your blood sugar (glucose) after you have not eaten for a while (fasting). You may have this done every 1-3 years.  Bone density scan. This is done to screen for osteoporosis. You may have this done starting at age 67  Mammogram. This may be done every 1-2 years. Talk to your health care provider about how often you should have regular mammograms.  Talk with your health care provider about your test results, treatment options, and if necessary, the need for more tests. Vaccines Your health care provider may recommend certain vaccines, such as:  Influenza vaccine. This is recommended every year.  Tetanus, diphtheria, and acellular pertussis (Tdap, Td) vaccine. You may need a Td booster every 10 years.  Varicella vaccine. You may need this if you have not been vaccinated.  Zoster vaccine. You may need this after age 35.  Measles, mumps, and rubella (  MMR) vaccine. You may need at least one dose of MMR if you were born in 1957 or later. You may also need a second dose.  Pneumococcal 13-valent conjugate (PCV13) vaccine. One dose is recommended after age 65.  Pneumococcal polysaccharide (PPSV23) vaccine. One dose is recommended after age 30.  Meningococcal vaccine. You may need this if you have certain conditions.  Hepatitis A vaccine.  You may need this if you have certain conditions or if you travel or work in places where you may be exposed to hepatitis A.  Hepatitis B vaccine. You may need this if you have certain conditions or if you travel or work in places where you may be exposed to hepatitis B.  Haemophilus influenzae type b (Hib) vaccine. You may need this if you have certain conditions.  Talk to your health care provider about which screenings and vaccines you need and how often you need them. This information is not intended to replace advice given to you by your health care provider. Make sure you discuss any questions you have with your health care provider. Document Released: 06/27/2015 Document Revised: 02/18/2016 Document Reviewed: 04/01/2015 Elsevier Interactive Patient Education  2017 [employer].

## 2017-02-16 NOTE — Progress Notes (Signed)
Subjective:  Marissa Williams is a 66 y.o. female who presents today with a chief complaint of HTN and to establish care.   HPI:  Hypertension, Chronic Problem, New problem to this provider BP Readings from Last 3 Encounters:  02/16/17 130/80  02/15/17 (!) 149/93  04/26/16 137/87   Home BP monitoring: Yes, Ranges: 140s/80s Current Medications: HCTZ 35mg  daily, compliant without side effects. Additional History: Has been on HCTZ for several years. Several months ago she had her dose decreased from 25mg  to 12.5mg . Over the past several weeks, she noticed increased dizziness (see below) and increased blood pressure. Yesterday her blood pressure at work was in the [place] and she was directed to go to urgent care. There, her pressure was improved, but the physician increased her to 25mg  daily.    ROS: Denies any chest pain, shortness of breath, dyspnea on exertion, leg edema.   Dizziness, Acute Problem First started noticing several weeks ago, but significantly worse over the past few days. Described as a lightheadedness and like she is off balance. No syncope or presyncopal symptoms. No vertigo. Sometimes worse when looking down, but has not noticed anything else that makes it better or worse. No hearing loss. No weakness or numbness. Today, it is a little better. Thinks that it may be related to her blood pressure being elevated recently.   Osteopenia, Chronic Problem. New to this provider Diagnosed last year on DXA scan. Has been taking calcium supplements but not vitamin D.   ROS: Per HPI, otherwise a 14 point review of systems was performed and was negative  PMH:  The following were reviewed and entered/updated in epic: Past Medical History:  Diagnosis Date  . Breast cancer of upper-inner quadrant of left female breast ([place]) 01/27/2015  . Hypertension Dx 2008  . Radiation 03/19/15-05/02/15   left breast   Patient Active Problem List   Diagnosis Date Noted  . Osteopenia 08/12/2016    . Vaginal dryness, menopausal 04/26/2016  . Breast cancer of upper-inner quadrant of left female breast ([facility] [facility]) 01/27/2015  . Hot flashes, menopausal 01/16/2015  . Essential hypertension 04/16/2014   Past Surgical History:  Procedure Laterality Date  . ABDOMINAL HYSTERECTOMY  1980   uterine prolapse   . BREAST LUMPECTOMY WITH RADIOACTIVE SEED AND SENTINEL LYMPH NODE BIOPSY Left 02/06/2015   Procedure: LEFT BREAST LUMPECTOMY WITH RADIOACTIVE SEED AND LEFT AXILLARY SENTINEL LYMPH NODE BIOPSY;  Surgeon: Excell Seltzer, MD;  Location: [facility];  Service: General;  Laterality: Left;  . TUBAL LIGATION  1970    Family History  Problem Relation Age of Onset  . Alzheimer's disease Father   . Cancer Mother        throat cancer- smoker and ETOH   . Heart disease Neg Hx   . Diabetes Neg Hx     Medications- reviewed and updated Current Outpatient Prescriptions  Medication Sig Dispense Refill  . anastrozole (ARIMIDEX) 1 MG tablet TAKE 1 TABLET BY MOUTH DAILY. 90 tablet 4  . Calcium Carb-Cholecalciferol (CALCIUM/VITAMIN D) 600-400 MG-UNIT TABS Take 2 tablets by mouth daily. 60 tablet 11  . hydrochlorothiazide (HYDRODIURIL) 25 MG tablet Take 1 tablet (25 mg total) by mouth daily. 30 tablet 1   No current facility-administered medications for this visit.     Allergies-reviewed and updated No Known Allergies  Social History   Social History  . Marital status: Legally Separated    Spouse name: N/A  . Number of children: 4  . Years of education:  9    Occupational History  . Student      G[employer]  . [place] History Main Topics  . Smoking status: Never Smoker  . Smokeless tobacco: Never Used  . Alcohol use Yes     Comment: occas  . Drug use: No     Comment: in the past cocaine. none since 2010.   Marland Kitchen Sexual activity: Not Asked   Other Topics Concern  . None   Social History Narrative   Had 4 children.    3 living.   One child  died at 17 months, SIDs.    Lives alone.    Children in town.        Objective:  Physical Exam: BP 130/80 (BP Location: Right Arm, Cuff Size: Normal)   Ht 5\' 3"  (1.6 m)   Wt 156 lb 6.4 oz (70.9 kg)   SpO2 97%   BMI 27.71 kg/m   Lying: 150/92 Sitting: 144/90 Standing: 130/80  Gen: NAD, resting comfortably HEENT: TMs normal bilaterally. CV: RRR with no murmurs appreciated Pulm: NWOB, CTAB with no crackles, wheezes, or rhonchi GI: Normal bowel sounds present. Soft, Nontender, Nondistended. MSK: no edema, cyanosis, or clubbing noted Skin: warm, dry Neuro: CN2-12 intact. Finger-nose-finger intact bilaterally. Strength 5/5 in upper extremities in all fields. Sensation to light touch intact throughout.  Psych: Normal affect and thought content  Assessment/Plan:  Essential hypertension At goal. Continue HCTZ. Check BMET.   Osteopenia Based on DEXA last year. Encouraged 1200mg  of calcium daily and 800 units of vitamin D. Will check vitamin D levels. Due for DEXA next year.   Dizziness Patient with borderline orthostasis based on vital signs - but did not have any symptoms when going from lying to standing. Physical exam is otherwise normal. Will check CBC, TSH, and CMET to rule out other causes.  History of hyperglycemia Check A1c.  Preventive Healthcare Check lipid panel. Flu shot given today.   Algis Greenhouse. Jerline Pain, MD 02/16/2017 9:01 AM

## 2017-02-16 NOTE — Assessment & Plan Note (Signed)
At goal. Continue HCTZ. Check BMET.

## 2017-02-16 NOTE — Assessment & Plan Note (Signed)
Based on DEXA last year. Encouraged 1200mg  of calcium daily and 800 units of vitamin D. Will check vitamin D levels. Due for DEXA next year.

## 2017-02-17 ENCOUNTER — Telehealth: Payer: Self-pay | Admitting: Family Medicine

## 2017-02-17 ENCOUNTER — Other Ambulatory Visit: Payer: Self-pay | Admitting: Family Medicine

## 2017-02-17 DIAGNOSIS — Z853 Personal history of malignant neoplasm of breast: Secondary | ICD-10-CM

## 2017-02-17 NOTE — Telephone Encounter (Signed)
Patient returning phone call about lab results, okay to leave a detailed message with results.

## 2017-02-17 NOTE — Telephone Encounter (Signed)
See lab result note.

## 2017-02-18 ENCOUNTER — Other Ambulatory Visit: Payer: Self-pay | Admitting: Family Medicine

## 2017-02-18 MED ORDER — ATORVASTATIN CALCIUM 40 MG PO TABS: 40.0000 mg | ORAL_TABLET | Freq: Every day | ORAL | 3 refills | Status: DC

## 2017-02-18 MED FILL — ATORVASTATIN 40 MG TABLET: 40 | 90 days supply | Qty: 90 | Fill #0

## 2017-02-18 NOTE — Progress Notes (Signed)
Rx for atorvastatin sent in.  Algis Greenhouse. Jerline Pain, MD 02/18/2017 8:36 AM

## 2017-02-21 ENCOUNTER — Telehealth: Payer: Self-pay | Admitting: Family Medicine

## 2017-02-21 NOTE — Telephone Encounter (Signed)
Patient called just to ensure that Dr. Jerline Pain was aware that [employer] may be faxing over paperwork for her FMLA for her blood pressure this week. [place] office on stand by.

## 2017-02-22 NOTE — Telephone Encounter (Signed)
FYI

## 2017-02-23 NOTE — Telephone Encounter (Signed)
Patient's FMLA paperwork was received by the front office today and placed in Dr. Marigene Ehlers folder at the front to be picked up by CMA and filled out by Dr. Jerline Pain.

## 2017-02-25 ENCOUNTER — Other Ambulatory Visit: Payer: Self-pay

## 2017-02-25 DIAGNOSIS — C50212 Malignant neoplasm of upper-inner quadrant of left female breast: Secondary | ICD-10-CM

## 2017-02-28 ENCOUNTER — Ambulatory Visit (HOSPITAL_BASED_OUTPATIENT_CLINIC_OR_DEPARTMENT_OTHER): Payer: 59 | Admitting: Oncology

## 2017-02-28 ENCOUNTER — Telehealth: Payer: Self-pay | Admitting: Oncology

## 2017-02-28 ENCOUNTER — Other Ambulatory Visit (HOSPITAL_BASED_OUTPATIENT_CLINIC_OR_DEPARTMENT_OTHER): Payer: 59

## 2017-02-28 VITALS — BP 158/96 | HR 74 | Temp 97.9°F | Resp 19 | Ht 63.0 in | Wt 157.3 lb

## 2017-02-28 DIAGNOSIS — Z79811 Long term (current) use of aromatase inhibitors: Secondary | ICD-10-CM | POA: Diagnosis not present

## 2017-02-28 DIAGNOSIS — C50212 Malignant neoplasm of upper-inner quadrant of left female breast: Secondary | ICD-10-CM

## 2017-02-28 DIAGNOSIS — Z170 Estrogen receptor positive status [ER+]: Secondary | ICD-10-CM

## 2017-02-28 DIAGNOSIS — M8580 Other specified disorders of bone density and structure, unspecified site: Secondary | ICD-10-CM

## 2017-02-28 DIAGNOSIS — N951 Menopausal and female climacteric states: Secondary | ICD-10-CM

## 2017-02-28 LAB — CBC WITH DIFFERENTIAL/PLATELET
BASO%: 0.4 % (ref 0.0–2.0)
Basophils Absolute: 0 10*3/uL (ref 0.0–0.1)
EOS ABS: 0.1 10*3/uL (ref 0.0–0.5)
EOS%: 1.3 % (ref 0.0–7.0)
HEMATOCRIT: 39.1 % (ref 34.8–46.6)
HEMOGLOBIN: 13.5 g/dL (ref 11.6–15.9)
LYMPH#: 2.6 10*3/uL (ref 0.9–3.3)
LYMPH%: 34.4 % (ref 14.0–49.7)
MCH: 28.7 pg (ref 25.1–34.0)
MCHC: 34.5 g/dL (ref 31.5–36.0)
MCV: 83.2 fL (ref 79.5–101.0)
MONO#: 0.7 10*3/uL (ref 0.1–0.9)
MONO%: 9.3 % (ref 0.0–14.0)
NEUT#: 4.2 10*3/uL (ref 1.5–6.5)
NEUT%: 54.6 % (ref 38.4–76.8)
Platelets: 295 10*3/uL (ref 145–400)
RBC: 4.7 10*6/uL (ref 3.70–5.45)
RDW: 13.1 % (ref 11.2–14.5)
WBC: 7.6 10*3/uL (ref 3.9–10.3)

## 2017-02-28 LAB — COMPREHENSIVE METABOLIC PANEL
ALBUMIN: 4.1 g/dL (ref 3.5–5.0)
ALK PHOS: 64 U/L (ref 40–150)
ALT: 15 U/L (ref 0–55)
AST: 19 U/L (ref 5–34)
Anion Gap: 9 mEq/L (ref 3–11)
BUN: 15 mg/dL (ref 7.0–26.0)
CALCIUM: 10.1 mg/dL (ref 8.4–10.4)
CHLORIDE: 103 meq/L (ref 98–109)
CO2: 27 mEq/L (ref 22–29)
Creatinine: 0.8 mg/dL (ref 0.6–1.1)
EGFR: 88 mL/min/{1.73_m2} — ABNORMAL LOW (ref >90)
Glucose: 96 mg/dl (ref 70–140)
POTASSIUM: 3.5 meq/L (ref 3.5–5.1)
Sodium: 139 mEq/L (ref 136–145)
Total Bilirubin: 0.77 mg/dL (ref 0.20–1.20)
Total Protein: 7.8 g/dL (ref 6.4–8.3)

## 2017-02-28 LAB — HM COLONOSCOPY

## 2017-02-28 NOTE — Telephone Encounter (Signed)
Patient called in checking on FMLA paperwork. Please call patient and advise when finished.

## 2017-02-28 NOTE — Progress Notes (Signed)
[facility]  [phone] Fax:([phone]) [phone]     ID: Marissa Williams DOB: 10-31-1950  MR#: [id number]  SCX#:[id number]  Patient Care Team: Ardith Dark, MD as PCP - General (Family Medicine) Glenna Fellows, MD as Consulting Physician (General Surgery) Khoa Opdahl, Valentino Hue, MD as Consulting Physician (Oncology) Antony Blackbird, MD as Consulting Physician (Radiation Oncology) Pershing Proud, RN as Registered Nurse Donnelly Angelica, RN as Registered Nurse Damita Lack, Marcy Salvo, NP as Nurse Practitioner (Nurse Practitioner) PCP: Ardith Dark, MD OTHER MD:  CHIEF COMPLAINT: Estrogen receptor positive breast cancer  CURRENT TREATMENT: Anastrozole   BREAST CANCER HISTORY: From the original intake note:  Marissa Williams noted a change in her left breast sometime in June 2016. She had had bilateral screening mammography at [facility] 02/12/2014 with no suspicious findings. Eventually Marissa Williams brought her concern to medical attention and her primary care physician set her up for bilateral left diagnostic mammography with tomography and [facility] 03/04/2015. The breast density was category B. In the region of a palpable mass (10:30 position left breast 9 cm from the nipple) there was a spiculated mass measuring approximately 10 mm on mammography. On ultrasound this was an irregular hypoechoic mass measuring 9 mm. There was no internal vascular flow. The mass abuts the pectoralis muscle but does not appear to invade it. Left axillary ultrasound showed normal appearing lymph nodes.  Biopsy of the left breast mass in question 01/21/2015 showed ([id number]) an invasive ductal carcinoma, E-cadherin positive, grade 2, estrogen receptor 100% positive, with strong staining intensity; progesterone receptor negative, with an MIB-1 of 20% and no HER-2 amplification, the signals ratio being 1.48 and the number per cell  2.00.  Her subsequent history is as detailed below  INTERVAL HISTORY: Marissa Williams returns today for follow-up and treatment of her estrogen receptor positive breast cancer. She continues on anastrozole, with good tolerance Pt reports that she has been overall well. Pt notes that she has mild daytime hot flashes and no change in vaginal dryness. She reports that she pays $15 for a 3 month prescription. 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 Pt reports that her family has been doing well and that today is her birthday. She states that she will go out to eat with her family to celebrate. Pt reports that she is off for the week.    REVIEW OF SYSTEMS: Marissa Williams reports that she will began exercising and recently joined a gym. She denies unusual headaches, visual changes, nausea, vomiting, or dizziness. There has been no unusual cough, phlegm production, or pleurisy. This been no change in bowel or bladder habits. She denies unexplained fatigue or unexplained weight loss, bleeding, rash, or fever. A detailed review of systems was otherwise negative.    PAST MEDICAL HISTORY: Past Medical History:  Diagnosis Date  . Breast cancer of upper-inner quadrant of left female breast ([facility]) 01/27/2015  . Hypertension Dx 2008  . Radiation 03/19/15-05/02/15   left breast    PAST SURGICAL HISTORY: Past Surgical History:  Procedure Laterality Date  . ABDOMINAL HYSTERECTOMY  1980   uterine prolapse   . BREAST LUMPECTOMY WITH RADIOACTIVE SEED AND SENTINEL LYMPH NODE BIOPSY Left 02/06/2015   Procedure: LEFT BREAST LUMPECTOMY WITH RADIOACTIVE SEED AND LEFT AXILLARY SENTINEL LYMPH NODE BIOPSY;  Surgeon: Glenna Fellows, MD;  Location: [facility];  Service: General;  Laterality: Left;  . TUBAL LIGATION  1970    FAMILY  HISTORY Family History  Problem Relation Age of Onset  . Alzheimer's disease Father   . Cancer Mother        throat cancer- smoker and ETOH   . Heart disease Neg Hx    . Diabetes Neg Hx    the patient's father died at the age of 24 with Alzheimer's disease. The patient's mother died from what seemed to have been DTs at the age of 14. She had a history of throat cancer and EtOH. The patient was an only child. There is no other history of cancer in the family to her knowledge   GYNECOLOGIC HISTORY:  No LMP recorded. Patient has had a hysterectomy.  menarche age 28, first live birth age 1. The patient is GX P4. She underwent hysterectomy without salpingo-oophorectomy in 1980. She did not take hormone replacement.   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 She is single, and lives by herself, with no pets. Her son [name] lives in [place] where he works as an Surveyor, minerals. Her son [name] is a Pharmacist, hospital working in [place]. Daughter [name] lives in [place] currently under unfortunate circumstances. The patient's fourth child a son, was born premature and died at 71 months from lung problems. [name] has 7 grandchildren and 3 great-grandchildren. She attends a local [employer]:  not in place    HEALTH MAINTENANCE: Social History  Substance Use Topics  . Smoking status: Never Smoker  . Smokeless tobacco: Never Used  . Alcohol use Yes     Comment: occas     Colonoscopy: October 2014/[facility]  PAP: status post hysterectomy   Bone density: never   Lipid panel:  No Known Allergies  Current Outpatient Prescriptions  Medication Sig Dispense Refill  . anastrozole (ARIMIDEX) 1 MG tablet TAKE 1 TABLET BY MOUTH DAILY. 90 tablet 4  . atorvastatin (LIPITOR) 40 MG tablet Take 1 tablet (40 mg total) by mouth daily. 90 tablet 3  . Calcium Carb-Cholecalciferol (CALCIUM/VITAMIN D) 600-400 MG-UNIT TABS Take 2 tablets by mouth daily. 60 tablet 11  . hydrochlorothiazide (HYDRODIURIL) 25 MG tablet Take 1 tablet (25 mg total) by mouth daily. 30 tablet 1   No current facility-administered  medications for this visit.     OBJECTIVE: Middle-aged African-American woman In no acute distress  Vitals:   02/28/17 1323  BP: (!) 158/96  Pulse: 74  Resp: 19  Temp: 97.9 F (36.6 C)  SpO2: 100%     Body mass index is 27.86 kg/m.    ECOG FS:0 - Asymptomatic  Sclerae unicteric, EOMs intact Oropharynx clear and moist No cervical or supraclavicular adenopathy Lungs no rales or rhonchi Heart regular rate and rhythm Abd soft, nontender, positive bowel sounds MSK no focal spinal tenderness, no upper extremity lymphedema Neuro: nonfocal, well oriented, appropriate affect Breasts: The right breast is benign. The left breast is undergone lumpectomy followed by radiation. There is no evidence of local recurrence. Both axillae are benign.  LAB RESULTS:  CMP     Component Value Date/Time   NA 138 02/16/2017 0840   NA 140 02/25/2016 1558   K 4.5 02/16/2017 0840   K 3.6 02/25/2016 1558   CL 98 02/16/2017 0840   CO2 30 02/16/2017 0840   CO2 24 02/25/2016 1558   GLUCOSE 105 (H) 02/16/2017 0840   GLUCOSE 82 02/25/2016 1558   BUN 13 02/16/2017 0840   BUN 19.1 02/25/2016 1558   CREATININE 0.70 02/16/2017 0840  CREATININE 0.8 02/25/2016 1558   CALCIUM 10.3 02/16/2017 0840   CALCIUM 9.4 02/25/2016 1558   PROT 7.8 02/16/2017 0840   PROT 7.7 02/25/2016 1558   ALBUMIN 4.7 02/16/2017 0840   ALBUMIN 3.7 02/25/2016 1558   AST 16 02/16/2017 0840   AST 17 02/25/2016 1558   ALT 13 02/16/2017 0840   ALT 14 02/25/2016 1558   ALKPHOS 60 02/16/2017 0840   ALKPHOS 61 02/25/2016 1558   BILITOT 0.6 02/16/2017 0840   BILITOT 0.31 02/25/2016 1558   GFRNONAA >89 04/16/2014 1141   GFRAA >89 04/16/2014 1141    INo results found for: SPEP, UPEP  Lab Results  Component Value Date   WBC 7.6 02/28/2017   NEUTROABS 4.2 02/28/2017   HGB 13.5 02/28/2017   HCT 39.1 02/28/2017   MCV 83.2 02/28/2017   PLT 295 02/28/2017      Chemistry      Component Value Date/Time   NA 138 02/16/2017  0840   NA 140 02/25/2016 1558   K 4.5 02/16/2017 0840   K 3.6 02/25/2016 1558   CL 98 02/16/2017 0840   CO2 30 02/16/2017 0840   CO2 24 02/25/2016 1558   BUN 13 02/16/2017 0840   BUN 19.1 02/25/2016 1558   CREATININE 0.70 02/16/2017 0840   CREATININE 0.8 02/25/2016 1558      Component Value Date/Time   CALCIUM 10.3 02/16/2017 0840   CALCIUM 9.4 02/25/2016 1558   ALKPHOS 60 02/16/2017 0840   ALKPHOS 61 02/25/2016 1558   AST 16 02/16/2017 0840   AST 17 02/25/2016 1558   ALT 13 02/16/2017 0840   ALT 14 02/25/2016 1558   BILITOT 0.6 02/16/2017 0840   BILITOT 0.31 02/25/2016 1558       No results found for: LABCA2  No components found for: LABCA125  No results for input(s): INR in the last 168 hours.  Urinalysis    Component Value Date/Time   COLORURINE YELLOW 03/06/2009 [place] 03/06/2009 1804   LABSPEC 1.026 03/06/2009 1804   PHURINE 5.5 03/06/2009 1804   GLUCOSEU NEGATIVE 03/06/2009 1804   HGBUR LARGE (A) 03/06/2009 1804   BILIRUBINUR SMALL (A) 03/06/2009 1804   KETONESUR 40 (A) 03/06/2009 1804   PROTEINUR NEGATIVE 03/06/2009 1804   UROBILINOGEN 1.0 03/06/2009 1804   NITRITE NEGATIVE 03/06/2009 1804   LEUKOCYTESUR TRACE (A) 03/06/2009 1804    STUDIES: Bilateral diagnostic mammography with tomography at the [facility] was obtained 03/10/2016, showing the breast density to be category B. There was no evidence of malignancy.  Bone density in the same date showed a T score of -2.2  ASSESSMENT: 66 y.o. [place] woman status post left breast biopsy 01/21/2015 for a clinical T1b N0, stage IA invasive ductal carcinoma, grade 1 or 2, strongly estrogen receptor positive, progesterone receptor negative, with no HER-2 amplification and an MIB-1 of 20%  (1) left lumpectomy and sentinel lymph node sampling 02/06/2015 showed a pT1c pN0, stage IA invasive ductal carcinoma, grade 2, with negative margins   (2) OncotypeDX score of 29 falls in the  intermediate range and predicts a 10 year risk of outside the breast recurrence of 19% if the patient's only systemic therapy is tamoxifen for 5 years   (a) patient declined adjuvant chemotherapy  (3) adjuvant radiation 03/19/2015-05/02/2015: Left breast 50.4 Gy in 28 fx's; lumpectomy cavity boost 10 Gy in 5 fx's  (4) on anastrozole as of November 2016  PLAN: Yazmyn is now 2 years out from definitive surgery for  her breast cancer with no evidence of disease recurrence. This is very favorable.  She continues on anastrozole, with good tolerance, and the plan is to continue that for a total of 5 years. She does have osteopenia. Anastrozole certainly can worsen that. Most of the bone loss with aromatase inhibitors tends to occur the first year so hopefully things will have stabilized. I encouraged her to continue on her calcium and vitamin D supplementation and with her excellent plan to join a gym in addition to her regular work  She will have her mammogram this year later this month. Accordingly I will see her mid-October next year, after her mammogram instead of before  She knows to call for any other issues that may develop before then.Jana Hakim, Virgie Dad, MD  02/28/17 1:28 PM Medical Oncology and Hematology [facility] [address] [place], [place] [address] Tel. [phone]    Fax. [phone]  This document serves as a record of services personally performed by Lurline Del, MD. It was created on her behalf by [name], a trained medical scribe. The creation of this record is based on the scribe's personal observations and the provider's statements to them. This document has been checked and approved by the attending provider.

## 2017-02-28 NOTE — Telephone Encounter (Signed)
Scheduled patient for appts - did not want avs or calendar.

## 2017-02-28 NOTE — Telephone Encounter (Signed)
I have spoke with patient. She is aware that you will not be completing the FMLA paperwork due to her not needing to be out of work on 9/6 or 9/7. I did advise that we can write a note stating that she was in the office on 9/5. Note written and placed in review folder to sign. She does want this mailed to her home.

## 2017-03-01 DIAGNOSIS — Z86010 Personal history of colonic polyps: Secondary | ICD-10-CM | POA: Diagnosis not present

## 2017-03-01 DIAGNOSIS — Z1211 Encounter for screening for malignant neoplasm of colon: Secondary | ICD-10-CM | POA: Diagnosis not present

## 2017-03-21 MED FILL — GAVILYTE-G SOLUTION: 236 | 1 days supply | Qty: 4000 | Fill #0

## 2017-03-23 DIAGNOSIS — D125 Benign neoplasm of sigmoid colon: Secondary | ICD-10-CM | POA: Diagnosis not present

## 2017-03-23 DIAGNOSIS — Z1211 Encounter for screening for malignant neoplasm of colon: Secondary | ICD-10-CM | POA: Diagnosis not present

## 2017-03-23 DIAGNOSIS — D122 Benign neoplasm of ascending colon: Secondary | ICD-10-CM | POA: Diagnosis not present

## 2017-03-23 DIAGNOSIS — D120 Benign neoplasm of cecum: Secondary | ICD-10-CM | POA: Diagnosis not present

## 2017-03-23 DIAGNOSIS — K635 Polyp of colon: Secondary | ICD-10-CM | POA: Diagnosis not present

## 2017-03-23 LAB — HM COLONOSCOPY

## 2017-03-24 ENCOUNTER — Encounter: Payer: Self-pay | Admitting: Family Medicine

## 2017-03-28 ENCOUNTER — Encounter: Payer: Self-pay | Admitting: Family Medicine

## 2017-03-28 ENCOUNTER — Ambulatory Visit
Admission: RE | Admit: 2017-03-28 | Discharge: 2017-03-28 | Disposition: A | Discharge status: Home / Self Care | Payer: 59 | Source: Ambulatory Visit | Attending: Family Medicine | Admitting: Family Medicine

## 2017-03-28 DIAGNOSIS — R922 Inconclusive mammogram: Secondary | ICD-10-CM | POA: Diagnosis not present

## 2017-03-28 DIAGNOSIS — Z853 Personal history of malignant neoplasm of breast: Secondary | ICD-10-CM

## 2017-03-28 HISTORY — DX: Personal history of irradiation: Z92.3

## 2017-03-31 MED FILL — ANASTROZOLE 1 MG TABLET: 1 | 90 days supply | Qty: 90 | Fill #2

## 2017-04-18 ENCOUNTER — Telehealth: Payer: Self-pay | Admitting: Family Medicine

## 2017-04-18 MED ORDER — HYDROCHLOROTHIAZIDE 25 MG PO TABS: 25.0000 mg | ORAL_TABLET | Freq: Every day | ORAL | 1 refills | Status: DC

## 2017-04-18 MED FILL — HYDROCHLOROTHIAZIDE 25 MG T: 25 | 30 days supply | Qty: 30 | Fill #1

## 2017-04-18 NOTE — Addendum Note (Signed)
Addended by: Elio Forget on: 04/18/2017 02:46 PM   Modules accepted: Orders

## 2017-04-18 NOTE — Telephone Encounter (Signed)
MEDICATION: hydrochlorothiazide (HYDRODIURIL) 25 MG tablet [[id number]  PHARMACY:  [facility]  IS THIS A 90 DAY SUPPLY : no  IS PATIENT OUT OF MEDICATION: yes  IF NOT; HOW MUCH IS LEFT: 0  LAST APPOINTMENT DATE: @09 /10/2016  NEXT APPOINTMENT DATE: n/a  OTHER COMMENTS: patient says she accidentally threw away script bottle. Ok to leave vmail.   **Let patient know to contact pharmacy at the end of the day to make sure medication is ready. **  ** Please notify patient to allow 48-72 hours to process**  **Encourage patient to contact the pharmacy for refills or they can request refills through [facility]**

## 2017-04-18 NOTE — Telephone Encounter (Signed)
Medication sent in for patient. 

## 2017-05-17 ENCOUNTER — Telehealth: Payer: Self-pay | Admitting: Family Medicine

## 2017-05-17 MED FILL — ATORVASTATIN 40 MG TABLET: 40 | 90 days supply | Qty: 90 | Fill #1

## 2017-05-17 NOTE — Telephone Encounter (Signed)
Copied from [place]. Topic: Quick Communication - See Telephone Encounter >> May 17, 2017  4:48 PM Percell Belt A wrote: CRM for notification. See Telephone encounter for:  pt called in and said that she only has 2 pills left of her  hydrochlorothiazide (HYDRODIURIL) 25 MG tablet [[id number]]  , she needs a refill called in to [facility] outpatient pharmacy    05/17/17.

## 2017-05-18 ENCOUNTER — Other Ambulatory Visit: Payer: Self-pay

## 2017-05-18 MED ORDER — HYDROCHLOROTHIAZIDE 25 MG PO TABS: 25.0000 mg | ORAL_TABLET | Freq: Every day | ORAL | 1 refills | Status: DC

## 2017-05-18 MED FILL — HYDROCHLOROTHIAZIDE 25 MG T: 25 | 30 days supply | Qty: 30 | Fill #0

## 2017-05-19 NOTE — Telephone Encounter (Signed)
Chart  Reviewed   rx   Sent  05/18/2017    By  Provider

## 2017-06-16 MED FILL — HYDROCHLOROTHIAZIDE 25 MG T: 25 | 30 days supply | Qty: 30 | Fill #1

## 2017-06-28 ENCOUNTER — Encounter (HOSPITAL_COMMUNITY): Payer: Self-pay

## 2017-06-30 MED FILL — ANASTROZOLE 1 MG TABLET: 1 | 90 days supply | Qty: 90 | Fill #3

## 2017-07-15 ENCOUNTER — Other Ambulatory Visit: Payer: Self-pay

## 2017-07-15 ENCOUNTER — Other Ambulatory Visit: Payer: Self-pay | Admitting: Family Medicine

## 2017-07-15 ENCOUNTER — Telehealth: Payer: Self-pay | Admitting: Family Medicine

## 2017-07-15 MED ORDER — HYDROCHLOROTHIAZIDE 25 MG PO TABS: 25.0000 mg | ORAL_TABLET | Freq: Every day | ORAL | 1 refills | Status: DC

## 2017-07-15 MED FILL — HYDROCHLOROTHIAZIDE 25 MG T: 25 | 30 days supply | Qty: 30 | Fill #0

## 2017-07-15 NOTE — Telephone Encounter (Signed)
"  She would like to know if it is possible to get her BP meds in a 3 month supply instead of a 1 month." per team health.

## 2017-07-15 NOTE — Telephone Encounter (Signed)
Rx for 90 day supply sent to patient's pharmacy.

## 2017-08-11 MED FILL — HYDROCHLOROTHIAZIDE 25 MG T: 25 | 30 days supply | Qty: 30 | Fill #1

## 2017-08-11 MED FILL — ATORVASTATIN 40 MG TABLET: 40 | 90 days supply | Qty: 90 | Fill #2

## 2017-09-15 MED FILL — HYDROCHLOROTHIAZIDE 25 MG T: 25 | 90 days supply | Qty: 90 | Fill #0

## 2017-09-29 ENCOUNTER — Other Ambulatory Visit: Payer: Self-pay | Admitting: Oncology

## 2017-09-30 MED FILL — ANASTROZOLE 1 MG TABLET: 1 | 90 days supply | Qty: 90 | Fill #0

## 2017-10-19 ENCOUNTER — Ambulatory Visit: Payer: 59 | Admitting: Family Medicine

## 2017-11-11 ENCOUNTER — Encounter

## 2017-11-14 MED FILL — ATORVASTATIN 40 MG TABLET: 40 | 90 days supply | Qty: 90 | Fill #3

## 2017-12-05 DIAGNOSIS — Z0100 Encounter for examination of eyes and vision without abnormal findings: Secondary | ICD-10-CM | POA: Diagnosis not present

## 2017-12-05 DIAGNOSIS — Z0000 Encounter for general adult medical examination without abnormal findings: Secondary | ICD-10-CM | POA: Diagnosis not present

## 2017-12-05 DIAGNOSIS — E782 Mixed hyperlipidemia: Secondary | ICD-10-CM | POA: Diagnosis not present

## 2017-12-05 DIAGNOSIS — Z0110 Encounter for examination of ears and hearing without abnormal findings: Secondary | ICD-10-CM | POA: Diagnosis not present

## 2017-12-05 DIAGNOSIS — Z131 Encounter for screening for diabetes mellitus: Secondary | ICD-10-CM | POA: Diagnosis not present

## 2017-12-05 DIAGNOSIS — E663 Overweight: Secondary | ICD-10-CM | POA: Diagnosis not present

## 2017-12-05 DIAGNOSIS — I10 Essential (primary) hypertension: Secondary | ICD-10-CM | POA: Diagnosis not present

## 2017-12-05 DIAGNOSIS — Z136 Encounter for screening for cardiovascular disorders: Secondary | ICD-10-CM | POA: Diagnosis not present

## 2017-12-09 MED FILL — HYDROCHLOROTHIAZIDE 25 MG T: 25 | 90 days supply | Qty: 90 | Fill #1

## 2017-12-23 DIAGNOSIS — E782 Mixed hyperlipidemia: Secondary | ICD-10-CM | POA: Diagnosis not present

## 2017-12-23 DIAGNOSIS — E663 Overweight: Secondary | ICD-10-CM | POA: Diagnosis not present

## 2017-12-23 DIAGNOSIS — I517 Cardiomegaly: Secondary | ICD-10-CM | POA: Diagnosis not present

## 2017-12-23 DIAGNOSIS — I119 Hypertensive heart disease without heart failure: Secondary | ICD-10-CM | POA: Diagnosis not present

## 2017-12-23 DIAGNOSIS — I10 Essential (primary) hypertension: Secondary | ICD-10-CM | POA: Diagnosis not present

## 2017-12-26 MED FILL — ANASTROZOLE 1 MG TABLET: 1 | 90 days supply | Qty: 90 | Fill #1

## 2018-02-14 ENCOUNTER — Other Ambulatory Visit: Payer: Self-pay | Admitting: Family Medicine

## 2018-02-14 MED FILL — ATORVASTATIN 40 MG TABLET: 40 | 90 days supply | Qty: 90 | Fill #0

## 2018-03-03 ENCOUNTER — Other Ambulatory Visit: Payer: Self-pay | Admitting: Oncology

## 2018-03-03 ENCOUNTER — Other Ambulatory Visit: Payer: Self-pay | Admitting: Family Medicine

## 2018-03-03 DIAGNOSIS — Z853 Personal history of malignant neoplasm of breast: Secondary | ICD-10-CM

## 2018-03-06 DIAGNOSIS — H524 Presbyopia: Secondary | ICD-10-CM | POA: Diagnosis not present

## 2018-03-14 ENCOUNTER — Other Ambulatory Visit: Payer: Self-pay | Admitting: Family Medicine

## 2018-03-14 MED FILL — HYDROCHLOROTHIAZIDE 25 MG T: 25 | 90 days supply | Qty: 90 | Fill #0

## 2018-03-30 ENCOUNTER — Telehealth: Payer: Self-pay | Admitting: Oncology

## 2018-03-30 MED FILL — ANASTROZOLE 1 MG TABLET: 1 | 90 days supply | Qty: 90 | Fill #2

## 2018-03-30 NOTE — Telephone Encounter (Signed)
Called patient about voicemail and she wanted to wait and call back

## 2018-03-31 ENCOUNTER — Other Ambulatory Visit: Payer: Self-pay

## 2018-03-31 DIAGNOSIS — C50212 Malignant neoplasm of upper-inner quadrant of left female breast: Secondary | ICD-10-CM

## 2018-03-31 DIAGNOSIS — Z170 Estrogen receptor positive status [ER+]: Principal | ICD-10-CM

## 2018-04-03 ENCOUNTER — Inpatient Hospital Stay: Payer: 59

## 2018-04-03 ENCOUNTER — Inpatient Hospital Stay: Payer: 59 | Attending: Oncology | Admitting: Oncology

## 2018-04-05 ENCOUNTER — Ambulatory Visit
Admission: RE | Admit: 2018-04-05 | Discharge: 2018-04-05 | Disposition: A | Discharge status: Home / Self Care | Payer: 59 | Source: Ambulatory Visit | Attending: Oncology | Admitting: Oncology

## 2018-04-05 DIAGNOSIS — R922 Inconclusive mammogram: Secondary | ICD-10-CM | POA: Diagnosis not present

## 2018-04-05 DIAGNOSIS — Z853 Personal history of malignant neoplasm of breast: Secondary | ICD-10-CM

## 2018-04-19 ENCOUNTER — Encounter: Payer: Self-pay | Admitting: Physician Assistant

## 2018-04-19 ENCOUNTER — Ambulatory Visit (INDEPENDENT_AMBULATORY_CARE_PROVIDER_SITE_OTHER): Payer: 59 | Admitting: Physician Assistant

## 2018-04-19 ENCOUNTER — Telehealth: Payer: Self-pay | Admitting: Physician Assistant

## 2018-04-19 VITALS — BP 172/90 | HR 78 | Temp 97.8°F | Resp 16 | Ht 63.5 in | Wt 158.0 lb

## 2018-04-19 DIAGNOSIS — I10 Essential (primary) hypertension: Secondary | ICD-10-CM | POA: Diagnosis not present

## 2018-04-19 DIAGNOSIS — E785 Hyperlipidemia, unspecified: Secondary | ICD-10-CM

## 2018-04-19 DIAGNOSIS — Z8739 Personal history of other diseases of the musculoskeletal system and connective tissue: Secondary | ICD-10-CM

## 2018-04-19 DIAGNOSIS — Z23 Encounter for immunization: Secondary | ICD-10-CM

## 2018-04-19 LAB — POCT URINALYSIS DIP (MANUAL ENTRY)
Bilirubin, UA: NEGATIVE
Glucose, UA: NEGATIVE mg/dL
Ketones, POC UA: NEGATIVE mg/dL
Leukocytes, UA: NEGATIVE
Nitrite, UA: NEGATIVE
Protein Ur, POC: NEGATIVE mg/dL
Spec Grav, UA: 1.02 (ref 1.010–1.025)
Urobilinogen, UA: 0.2 E.U./dL
pH, UA: 6 (ref 5.0–8.0)

## 2018-04-19 MED ORDER — HYDROCHLOROTHIAZIDE 50 MG PO TABS: 25.0000 mg | ORAL_TABLET | Freq: Every day | ORAL | 3 refills | Status: DC

## 2018-04-19 NOTE — Progress Notes (Signed)
Marissa Williams  MRN: [id number] DOB: 1951/01/08  PCP: Trey Sailors, PA  Subjective:  Pt is a pleasant 67 year old female who presents to clinic for establishing care. She works in [place].   HTN - HTCZ 43m qd. And lipitor 452mqd. Does not check home blood pressures. Today's blood pressure is 150/92, recheck is 172/90.  Denies lightheadedness, dizziness, chronic headache, double vision, chest pain, shortness of breath, heart racing, palpitations, nausea, vomiting, abdominal pain, hematuria, lower leg swelling.  Echo done with [facility] 2020. She was asked to pay another $100 copay for a consultation visit. She did not want to pay, as she had paid $140 the week prior. She never heard about the results. She is worried about a "thick heart".   Last DEXA about 2 years ago. Hist of osteoporosis. She takes calcuim - D3 12518m 5,000 IU daily.  Stage 1 breast cancer. Cleared x 3 years. Last OV 02/2017  Needs PNA-23 Received flu shot at work.   Lab Results  Component Value Date   CHOL 198 02/16/2017   CHOL 159 02/15/2014   CHOL 178 03/24/2010   Lab Results  Component Value Date   HDL 55.20 02/16/2017   HDL 46 02/15/2014   HDL 43 03/24/2010   Lab Results  Component Value Date   LDLCALC 128 (H) 02/16/2017   LDLCALC 100 (H) 02/15/2014   LDLCALC 112 (H) 03/24/2010   Lab Results  Component Value Date   TRIG 73.0 02/16/2017   TRIG 65 02/15/2014   TRIG 113 03/24/2010   Lab Results  Component Value Date   CHOLHDL 4 02/16/2017   CHOLHDL 3.5 02/15/2014   CHOLHDL 4.1 Ratio 03/24/2010   No results found for: LDLDIRECT  Pt  has a past medical history of Breast cancer of upper-inner quadrant of left female breast (HCCWilkes8/15/2016), Hypertension (Dx 2008), Personal history of radiation therapy, and Radiation (03/19/15-05/02/15).  Review of Systems  Constitutional: Negative for chills, diaphoresis, fatigue and fever.  Cardiovascular: Negative for  chest pain, palpitations and leg swelling.  Neurological: Negative for weakness, light-headedness and headaches.    Patient Active Problem List   Diagnosis Date Noted  . Osteopenia 08/12/2016  . Vaginal dryness, menopausal 04/26/2016  . Malignant neoplasm of upper-inner quadrant of left breast in female, estrogen receptor positive (HCCLarned8/15/2016  . Hot flashes, menopausal 01/16/2015  . Essential hypertension 04/16/2014    Current Outpatient Medications on File Prior to Visit  Medication Sig Dispense Refill  . anastrozole (ARIMIDEX) 1 MG tablet TAKE 1 TABLET BY MOUTH DAILY. 90 tablet 4  . atorvastatin (LIPITOR) 40 MG tablet TAKE 1 TABLET (40 MG TOTAL) BY MOUTH DAILY. 90 tablet 3  . hydrochlorothiazide (HYDRODIURIL) 25 MG tablet TAKE 1 TABLET BY MOUTH DAILY. 90 tablet 1  . Calcium Carb-Cholecalciferol (CALCIUM/VITAMIN D) 600-400 MG-UNIT TABS Take 2 tablets by mouth daily. 60 tablet 11   No current facility-administered medications on file prior to visit.     No Known Allergies   Objective:  BP (!) 150/92 (BP Location: Right Arm, Patient Position: Sitting, Cuff Size: Normal)   Pulse 78   Temp 97.8 F (36.6 C) (Oral)   Resp 16   Ht 5' 3.5" (1.613 m)   Wt 158 lb (71.7 kg)   SpO2 99%   BMI 27.55 kg/m   Physical Exam  Constitutional: She is oriented to person, place, and time. No distress.  Neck: Carotid bruit is not present.  Cardiovascular: Normal rate,  regular rhythm and normal heart sounds.  Musculoskeletal:       Right ankle: She exhibits no swelling.       Left ankle: She exhibits no swelling.       Right lower leg: She exhibits no edema.       Left lower leg: She exhibits no edema.  Neurological: She is alert and oriented to person, place, and time.  Skin: Skin is warm and dry.  Psychiatric: Judgment normal.  Vitals reviewed.   Assessment and Plan :  1. Essential hypertension - Pt here to establish care. Last annual one year ago. Will check routine labs today.  Blood pressure is elevated. Increase HCTZ from 20 to 35m qd. RTC in 3 weeks for recheck.  - Recheck vitals - Lipid panel - CMP14+EGFR - POCT urinalysis dipstick - hydrochlorothiazide (HYDRODIURIL) 50 MG tablet; Take 0.5 tablets (25 mg total) by mouth daily.  Dispense: 90 tablet; Refill: 3  2. Hyperlipidemia, unspecified hyperlipidemia type - will check lipid panel. con't current medication.  - Lipid panel  3. History of osteopenia - VITAMIN D 25 Hydroxy (Vit-D Deficiency, Fractures)  4. Need for 23-polyvalent pneumococcal polysaccharide vaccine - Administered by CMA - Pneumococcal polysaccharide vaccine 23-valent greater than or equal to 2yo subcutaneous/IM   WMercer Pod PA-C  [facility]/2017 8:17 AM  Please note: Portions of this report may have been transcribed using dragon voice recognition software. Every effort was made to ensure accuracy; however, inadvertent computerized transcription errors may be present.

## 2018-04-19 NOTE — Telephone Encounter (Signed)
Copied from [place] [id number]. Topic: General - Other >> Apr 19, 2018  9:27 AM Oneta Rack wrote: Relation to pt: self  Call back number: [phone]   Reason for call: Patient was seen today by [facility] and was advised to call back with the following information: [place], [place], [place] [address]  Raelyn Number, [place]

## 2018-04-19 NOTE — Patient Instructions (Addendum)
Start taking HCTZ 50mg . Come back and see me Wednesday Dec. 4th.  Check your blood pressure at least 2-3 times/week. Write these numbers down and bring them with you to your next appointment.   Make improvements to your diet to help lower your blood pressure.   DASH Eating Plan DASH stands for "Dietary Approaches to Stop Hypertension." The DASH eating plan is a healthy eating plan that has been shown to reduce high blood pressure (hypertension). It may also reduce your risk for type 2 diabetes, heart disease, and stroke. The DASH eating plan may also help with weight loss. What are tips for following this plan? General guidelines  Avoid eating more than 2,300 mg (milligrams) of salt (sodium) a day. If you have hypertension, you may need to reduce your sodium intake to 1,500 mg a day.  Limit alcohol intake to no more than 1 drink a day for nonpregnant women and 2 drinks a day for men. One drink equals 12 oz of beer, 5 oz of wine, or 1 oz of hard liquor.  Work with your health care provider to maintain a healthy body weight or to lose weight. Ask what an ideal weight is for you.  Get at least 30 minutes of exercise that causes your heart to beat faster (aerobic exercise) most days of the week. Activities may include walking, swimming, or biking.  Work with your health care provider or diet and nutrition specialist (dietitian) to adjust your eating plan to your individual calorie needs. Reading food labels  Check food labels for the amount of sodium per serving. Choose foods with less than 5 percent of the Daily Value of sodium. Generally, foods with less than 300 mg of sodium per serving fit into this eating plan.  To find whole grains, look for the word "whole" as the first word in the ingredient list. Shopping  Buy products labeled as "low-sodium" or "no salt added."  Buy fresh foods. Avoid canned foods and premade or frozen meals. Cooking  Avoid adding salt when cooking. Use  salt-free seasonings or herbs instead of table salt or sea salt. Check with your health care provider or pharmacist before using salt substitutes.  Do not fry foods. Cook foods using healthy methods such as baking, boiling, grilling, and broiling instead.  Cook with heart-healthy oils, such as olive, canola, soybean, or sunflower oil. Meal planning   Eat a balanced diet that includes: ? 5 or more servings of fruits and vegetables each day. At each meal, try to fill half of your plate with fruits and vegetables. ? Up to 6-8 servings of whole grains each day. ? Less than 6 oz of lean meat, poultry, or fish each day. A 3-oz serving of meat is about the same size as a deck of cards. One egg equals 1 oz. ? 2 servings of low-fat dairy each day. ? A serving of nuts, seeds, or beans 5 times each week. ? Heart-healthy fats. Healthy fats called Omega-3 fatty acids are found in foods such as flaxseeds and coldwater fish, like sardines, salmon, and mackerel.  Limit how much you eat of the following: ? Canned or prepackaged foods. ? Food that is high in trans fat, such as fried foods. ? Food that is high in saturated fat, such as fatty meat. ? Sweets, desserts, sugary drinks, and other foods with added sugar. ? Full-fat dairy products.  Do not salt foods before eating.  Try to eat at least 2 vegetarian meals each week.  Eat  more home-cooked food and less restaurant, buffet, and fast food.  When eating at a restaurant, ask that your food be prepared with less salt or no salt, if possible. What foods are recommended? The items listed may not be a complete list. Talk with your dietitian about what dietary choices are best for you. Grains Whole-grain or whole-wheat bread. Whole-grain or whole-wheat pasta. Brown rice. [place]. Bulgur. Whole-grain and low-sodium cereals. Pita bread. Low-fat, low-sodium crackers. Whole-wheat flour tortillas. Vegetables Fresh or frozen vegetables (raw, steamed,  roasted, or grilled). Low-sodium or reduced-sodium tomato and vegetable juice. Low-sodium or reduced-sodium tomato sauce and tomato paste. Low-sodium or reduced-sodium canned vegetables. Fruits All fresh, dried, or frozen fruit. Canned fruit in natural juice (without added sugar). Meat and other protein foods Skinless chicken or [place]. Ground chicken or [place]. Pork with fat trimmed off. Fish and seafood. Egg whites. Dried beans, peas, or lentils. Unsalted nuts, nut butters, and seeds. Unsalted canned beans. Lean cuts of beef with fat trimmed off. Low-sodium, lean deli meat. Dairy Low-fat (1%) or fat-free (skim) milk. Fat-free, low-fat, or reduced-fat cheeses. Nonfat, low-sodium ricotta or cottage cheese. Low-fat or nonfat yogurt. Low-fat, low-sodium cheese. Fats and oils Soft margarine without trans fats. Vegetable oil. Low-fat, reduced-fat, or light mayonnaise and salad dressings (reduced-sodium). Canola, safflower, olive, soybean, and sunflower oils. Avocado. Seasoning and other foods Herbs. Spices. Seasoning mixes without salt. Unsalted popcorn and pretzels. Fat-free sweets. What foods are not recommended? The items listed may not be a complete list. Talk with your dietitian about what dietary choices are best for you. Grains Baked goods made with fat, such as croissants, muffins, or some breads. Dry pasta or rice meal packs. Vegetables Creamed or fried vegetables. Vegetables in a cheese sauce. Regular canned vegetables (not low-sodium or reduced-sodium). Regular canned tomato sauce and paste (not low-sodium or reduced-sodium). Regular tomato and vegetable juice (not low-sodium or reduced-sodium). Angie Fava. Olives. Fruits Canned fruit in a light or heavy syrup. Fried fruit. Fruit in cream or butter sauce. Meat and other protein foods Fatty cuts of meat. Ribs. Fried meat. Berniece Salines. Sausage. Bologna and other processed lunch meats. Salami. Fatback. Hotdogs. Bratwurst. Salted nuts and seeds. Canned  beans with added salt. Canned or smoked fish. Whole eggs or egg yolks. Chicken or [place] with skin. Dairy Whole or 2% milk, cream, and half-and-half. Whole or full-fat cream cheese. Whole-fat or sweetened yogurt. Full-fat cheese. Nondairy creamers. Whipped toppings. Processed cheese and cheese spreads. Fats and oils Butter. Stick margarine. Lard. Shortening. Ghee. Bacon fat. Tropical oils, such as coconut, palm kernel, or palm oil. Seasoning and other foods Salted popcorn and pretzels. Onion salt, garlic salt, seasoned salt, table salt, and sea salt. Worcestershire sauce. Tartar sauce. Barbecue sauce. Teriyaki sauce. Soy sauce, including reduced-sodium. Steak sauce. Canned and packaged gravies. Fish sauce. Oyster sauce. Cocktail sauce. Horseradish that you find on the shelf. Ketchup. Mustard. Meat flavorings and tenderizers. Bouillon cubes. Hot sauce and Tabasco sauce. Premade or packaged marinades. Premade or packaged taco seasonings. Relishes. Regular salad dressings. Where to find more information:  National Heart, Lung, and [place]: https://wilson-eaton.com/  American Heart Association: www.heart.org Summary  The DASH eating plan is a healthy eating plan that has been shown to reduce high blood pressure (hypertension). It may also reduce your risk for type 2 diabetes, heart disease, and stroke.  With the DASH eating plan, you should limit salt (sodium) intake to 2,300 mg a day. If you have hypertension, you may need to reduce your sodium intake to  1,500 mg a day.  When on the DASH eating plan, aim to eat more fresh fruits and vegetables, whole grains, lean proteins, low-fat dairy, and heart-healthy fats.  Work with your health care provider or diet and nutrition specialist (dietitian) to adjust your eating plan to your individual calorie needs. This information is not intended to replace advice given to you by your health care provider. Make sure you discuss any questions you have with your  health care provider. Document Released: 05/20/2011 Document Revised: 05/24/2016 Document Reviewed: 05/24/2016 Elsevier Interactive Patient Education  [employer].  If you have lab work done today you will be contacted with your lab results within the next 2 weeks.  If you have not heard from [place] then please contact us. The fastest way to get your results is to register for My Chart.   IF you received an x-ray today, you will receive an invoice from [facility]. Please contact [facility] at [phone] with questions or concerns regarding your invoice.   IF you received labwork today, you will receive an invoice from [facility]. Please contact [facility] at [phone] with questions or concerns regarding your invoice.   Our billing staff will not be able to assist you with questions regarding bills from these companies.  You will be contacted with the lab results as soon as they are available. The fastest way to get your results is to activate your My Chart account. Instructions are located on the last page of this paperwork. If you have not heard from [place] regarding the results in 2 weeks, please contact this office.

## 2018-04-20 LAB — CMP14+EGFR
ALT: 26 IU/L (ref 0–32)
AST: 27 IU/L (ref 0–40)
Albumin/Globulin Ratio: 1.6 (ref 1.2–2.2)
Albumin: 4.7 g/dL (ref 3.6–4.8)
Alkaline Phosphatase: 57 IU/L (ref 39–117)
BUN/Creatinine Ratio: 27 (ref 12–28)
BUN: 21 mg/dL (ref 8–27)
Bilirubin Total: 0.4 mg/dL (ref 0.0–1.2)
CO2: 24 mmol/L (ref 20–29)
Calcium: 9.9 mg/dL (ref 8.7–10.3)
Chloride: 100 mmol/L (ref 96–106)
Creatinine, Ser: 0.77 mg/dL (ref 0.57–1.00)
GFR calc Af Amer: 92 mL/min/{1.73_m2} (ref >59)
GFR calc non Af Amer: 80 mL/min/{1.73_m2} (ref >59)
Globulin, Total: 2.9 g/dL (ref 1.5–4.5)
Glucose: 94 mg/dL (ref 65–99)
Potassium: 3.9 mmol/L (ref 3.5–5.2)
Sodium: 140 mmol/L (ref 134–144)
Total Protein: 7.6 g/dL (ref 6.0–8.5)

## 2018-04-20 LAB — VITAMIN D 25 HYDROXY (VIT D DEFICIENCY, FRACTURES): Vit D, 25-Hydroxy: 47.7 ng/mL (ref 30.0–100.0)

## 2018-04-20 LAB — LIPID PANEL
Chol/HDL Ratio: 2 ratio (ref 0.0–4.4)
Cholesterol, Total: 124 mg/dL (ref 100–199)
HDL: 62 mg/dL (ref >39)
LDL Calculated: 50 mg/dL (ref 0–99)
Triglycerides: 59 mg/dL (ref 0–149)
VLDL Cholesterol Cal: 12 mg/dL (ref 5–40)

## 2018-04-24 NOTE — Telephone Encounter (Signed)
Can we please request medical records for this pt from this office? If she needs to fill out a form for this, please have her RTC to do so. Thank you

## 2018-04-24 NOTE — Progress Notes (Signed)
Results released to mychart. Labs look great. Recheck in one year

## 2018-04-27 NOTE — Telephone Encounter (Signed)
Pt will go to her old md office and sign MR form to have her records sent to [place]

## 2018-04-27 NOTE — Telephone Encounter (Signed)
I can not do a verbal auth for her records to be sent to PCP. She needs to fill a release out at either location or cal them with a verbal to send her medical records to PCP. I left a VM giving her this info.

## 2018-05-12 MED FILL — ATORVASTATIN 40 MG TABLET: 40 | 90 days supply | Qty: 90 | Fill #1

## 2018-06-12 MED FILL — HYDROCHLOROTHIAZIDE 25 MG T: 25 | 90 days supply | Qty: 90 | Fill #1

## 2018-06-27 MED FILL — ANASTROZOLE 1 MG TABLET: 1 | 90 days supply | Qty: 90 | Fill #3

## 2018-07-06 NOTE — Telephone Encounter (Signed)
Pt stated that she signed a release at [place] on [facility] to have them send her medical records to [place]. She was calling to follow up and see if records were received. Please advise pt if they have or have not.

## 2018-07-19 ENCOUNTER — Telehealth: Payer: Self-pay | Admitting: Oncology

## 2018-07-19 NOTE — Telephone Encounter (Signed)
Called patient per 2/4 sch message - unable to reach patient - left message for patient to call back to set up appt.

## 2018-07-20 ENCOUNTER — Telehealth: Payer: Self-pay | Admitting: Oncology

## 2018-07-20 NOTE — Telephone Encounter (Signed)
Patient called to reschedule appointment that was missed

## 2018-08-15 MED FILL — ATORVASTATIN 40 MG TABLET: 40 | 90 days supply | Qty: 90 | Fill #2

## 2018-08-29 NOTE — Progress Notes (Signed)
[place]  [phone] [phone]    ID: Marissa Williams DOB: 1951-03-12  MR#: [id number]  NTI#:[id number]  Patient Care Team: Trey Sailors, PA as PCP - General (Physician Assistant) Excell Seltzer, MD as Consulting Physician (General Surgery) Dev Dhondt, Virgie Dad, MD as Consulting Physician (Oncology) Gery Pray, MD as Consulting Physician (Radiation Oncology) Mauro Kaufmann, RN as Registered Nurse Rockwell Germany, RN as Registered Nurse Jake Shark, Johny Blamer, NP as Nurse Practitioner (Nurse Practitioner) Juanita Craver, MD as Consulting Physician (Gastroenterology) OTHER MD:   CHIEF COMPLAINT: Estrogen receptor positive breast cancer  CURRENT TREATMENT: Anastrozole   BREAST CANCER HISTORY: From the original intake note:  Marissa Williams noted a change in her left breast sometime in June 2016. She had had bilateral screening mammography at [facility] 02/12/2014 with no suspicious findings. Eventually Marissa Williams brought her concern to medical attention and her primary care physician set her up for bilateral left diagnostic mammography with tomography and left breast ultrason[facility] August 03/04/2015. The breast density was category B. In the region of a palpable mass (10:30 position left breast 9 cm from the nipple) there was a spiculated mass measuring approximately 10 mm on mammography. On ultrasound this was an irregular hypoechoic mass measuring 9 mm. There was no internal vascular flow. The mass abuts the pectoralis muscle but does not appear to invade it. Left axillary ultrasound showed normal appearing lymph nodes.  Biopsy of the left breast mass in question 01/21/2015 showed ([id number]) an invasive ductal carcinoma, E-cadherin positive, grade 2, estrogen receptor 100% positive, with strong staining intensity; progesterone receptor negative, with an MIB-1 of 20% and no HER-2 amplification, the signals ratio  being 1.48 and the number per cell 2.00.  Her subsequent history is as detailed below   INTERVAL HISTORY: Canaan returns today for follow-up and treatment of her estrogen receptor positive breast cancer.   She continues on anastrozole. She continues to get hot flashes, which are worse at night. She also has some vaginal dryness.   Kalia's last bone density screening on 03/10/2016, showed a T-score of -2.2, which is considered osteopenic. She is overdue for repeat bone density screening.     Since her last visit here, she underwent a digital diagnostic bilateral mammogram on 04/05/2018 showing: Breast Density Category C. There is no mammographic evidence for malignancy.     REVIEW OF SYSTEMS: Giara denies unusual headaches, visual changes, nausea, vomiting, or dizziness. There has been no unusual cough, phlegm production, or pleurisy. This been no change in bowel or bladder habits. The patient denies unexplained fatigue or unexplained weight loss, bleeding, rash, or fever. A detailed review of systems was otherwise noncontributory.    PAST MEDICAL HISTORY: Past Medical History:  Diagnosis Date   Breast cancer of upper-inner quadrant of left female breast ([place]) 01/27/2015   Hypertension Dx 2008   Personal history of radiation therapy    Radiation 03/19/15-05/02/15   left breast    PAST SURGICAL HISTORY: Past Surgical History:  Procedure Laterality Date   ABDOMINAL HYSTERECTOMY  1980   uterine prolapse    BREAST LUMPECTOMY Left 2016   BREAST LUMPECTOMY WITH RADIOACTIVE SEED AND SENTINEL LYMPH NODE BIOPSY Left 02/06/2015   Procedure: LEFT BREAST LUMPECTOMY WITH RADIOACTIVE SEED AND LEFT AXILLARY SENTINEL LYMPH NODE BIOPSY;  Surgeon: Excell Seltzer, MD;  Location: [facility];  Service: General;  Laterality: Left;   TUBAL LIGATION  1970    FAMILY HISTORY Family History  Problem  Relation Age of Onset   Alzheimer's disease Father    Cancer Mother         throat cancer- smoker and ETOH    Heart disease Neg Hx    Diabetes Neg Hx    The patient's father died at the age of 40 with Alzheimer's disease. The patient's mother died from what seemed to have been DTs at the age of 23. She had a history of throat cancer and EtOH. The patient was an only child. There is no other history of cancer in the family to her knowledge    GYNECOLOGIC HISTORY:  No LMP recorded. Patient has had a hysterectomy.  menarche age 69, first live birth age 69. The patient is GX P4. She underwent hysterectomy without salpingo-oophorectomy in 1980. She did not take hormone replacement.    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 She is single, and lives by herself, with no pets. Her son Marissa Williams lives in [place] where he works as an Surveyor, minerals. Her son Marissa Williams is a Pharmacist, hospital working in [place]. Daughter Marissa Williams lives in [place] currently under unfortunate circumstances. The patient's fourth child a son, was born premature and died at 53 months from lung problems. Marissa Williams has 7 grandchildren and 3 great-grandchildren. She attends a local [employer]:  not in place    HEALTH MAINTENANCE: Social History   Tobacco Use   Smoking status: Never Smoker   Smokeless tobacco: Never Used  Substance Use Topics   Alcohol use: Yes    Comment: occas   Drug use: No    Comment: in the past cocaine. none since 2010.      Colonoscopy: October 2014/[facility]  PAP: status post hysterectomy   Bone density: never   Lipid panel:  No Known Allergies  Current Outpatient Medications  Medication Sig Dispense Refill   anastrozole (ARIMIDEX) 1 MG tablet Take 1 tablet (1 mg total) by mouth daily. 90 tablet 4   atorvastatin (LIPITOR) 40 MG tablet TAKE 1 TABLET (40 MG TOTAL) BY MOUTH DAILY. 90 tablet 3   gabapentin (NEURONTIN) 300 MG capsule Take 1 capsule  (300 mg total) by mouth at bedtime. 90 capsule 4   hydrochlorothiazide (HYDRODIURIL) 50 MG tablet Take 0.5 tablets (25 mg total) by mouth daily. 90 tablet 3   Vitamin D, Ergocalciferol, (DRISDOL) 1.25 MG (50000 UT) CAPS capsule Take 50,000 Units by mouth every 7 (seven) days.     No current facility-administered medications for this visit.     OBJECTIVE: Middle-aged African-American woman who appears well  Vitals:   08/30/18 1317  BP: (!) 161/80  Pulse: 78  Resp: 18  Temp: 98.2 F (36.8 C)  SpO2: 100%     Body mass index is 27.79 kg/m.    ECOG FS:0 - Asymptomatic  Sclerae unicteric, pupils round and equal No cervical or supraclavicular adenopathy Lungs no rales or rhonchi Heart regular rate and rhythm Abd soft, nontender, positive bowel sounds MSK no focal spinal tenderness, no upper extremity lymphedema Neuro: nonfocal, well oriented, appropriate affect Breasts: The right breast is unremarkable.  The left breast is status post lumpectomy and radiation.  There is no evidence of local recurrence.  Both axillae are benign.   LAB RESULTS:  CMP     Component Value Date/Time   NA 141 08/30/2018 1243   NA 140 04/19/2018 0904   NA 139 02/28/2017 1250  K 3.7 08/30/2018 1243   K 3.5 02/28/2017 1250   CL 103 08/30/2018 1243   CO2 27 08/30/2018 1243   CO2 27 02/28/2017 1250   GLUCOSE 90 08/30/2018 1243   GLUCOSE 96 02/28/2017 1250   BUN 18 08/30/2018 1243   BUN 21 04/19/2018 0904   BUN 15.0 02/28/2017 1250   CREATININE 0.87 08/30/2018 1243   CREATININE 0.8 02/28/2017 1250   CALCIUM 9.4 08/30/2018 1243   CALCIUM 10.1 02/28/2017 1250   PROT 7.7 08/30/2018 1243   PROT 7.6 04/19/2018 0904   PROT 7.8 02/28/2017 1250   ALBUMIN 4.0 08/30/2018 1243   ALBUMIN 4.7 04/19/2018 0904   ALBUMIN 4.1 02/28/2017 1250   AST 21 08/30/2018 1243   AST 19 02/28/2017 1250   ALT 24 08/30/2018 1243   ALT 15 02/28/2017 1250   ALKPHOS 58 08/30/2018 1243   ALKPHOS 64 02/28/2017 1250    BILITOT 0.7 08/30/2018 1243   BILITOT 0.77 02/28/2017 1250   GFRNONAA >60 08/30/2018 1243   GFRNONAA >89 04/16/2014 1141   GFRAA >60 08/30/2018 1243   GFRAA >89 04/16/2014 1141    INo results found for: SPEP, UPEP  Lab Results  Component Value Date   WBC 7.9 08/30/2018   NEUTROABS 4.9 08/30/2018   HGB 13.3 08/30/2018   HCT 37.3 08/30/2018   MCV 81.4 08/30/2018   PLT 298 08/30/2018      Chemistry      Component Value Date/Time   NA 141 08/30/2018 1243   NA 140 04/19/2018 0904   NA 139 02/28/2017 1250   K 3.7 08/30/2018 1243   K 3.5 02/28/2017 1250   CL 103 08/30/2018 1243   CO2 27 08/30/2018 1243   CO2 27 02/28/2017 1250   BUN 18 08/30/2018 1243   BUN 21 04/19/2018 0904   BUN 15.0 02/28/2017 1250   CREATININE 0.87 08/30/2018 1243   CREATININE 0.8 02/28/2017 1250      Component Value Date/Time   CALCIUM 9.4 08/30/2018 1243   CALCIUM 10.1 02/28/2017 1250   ALKPHOS 58 08/30/2018 1243   ALKPHOS 64 02/28/2017 1250   AST 21 08/30/2018 1243   AST 19 02/28/2017 1250   ALT 24 08/30/2018 1243   ALT 15 02/28/2017 1250   BILITOT 0.7 08/30/2018 1243   BILITOT 0.77 02/28/2017 1250       No results found for: LABCA2  No components found for: LABCA125  No results for input(s): INR in the last 168 hours.  Urinalysis    Component Value Date/Time   COLORURINE YELLOW 03/06/2009 [place] 03/06/2009 1804   LABSPEC 1.026 03/06/2009 1804   PHURINE 5.5 03/06/2009 1804   GLUCOSEU NEGATIVE 03/06/2009 1804   HGBUR LARGE (A) 03/06/2009 1804   BILIRUBINUR negative 04/19/2018 0849   KETONESUR negative 04/19/2018 0849   KETONESUR 40 (A) 03/06/2009 1804   PROTEINUR negative 04/19/2018 0849   PROTEINUR NEGATIVE 03/06/2009 1804   UROBILINOGEN 0.2 04/19/2018 0849   UROBILINOGEN 1.0 03/06/2009 1804   NITRITE Negative 04/19/2018 0849   NITRITE NEGATIVE 03/06/2009 1804   LEUKOCYTESUR Negative 04/19/2018 0849    STUDIES: No results found.   ASSESSMENT: 68  y.o. [place] woman status post left breast biopsy 01/21/2015 for a clinical T1b N0, stage IA invasive ductal carcinoma, grade 1 or 2, strongly estrogen receptor positive, progesterone receptor negative, with no HER-2 amplification and an MIB-1 of 20%  (1) left lumpectomy and sentinel lymph node sampling 02/06/2015 showed a pT1c pN0, stage IA invasive ductal  carcinoma, grade 2, with negative margins   (2) OncotypeDX score of 29 falls in the intermediate range and predicts a 10 year risk of outside the breast recurrence of 19% if the patient's only systemic therapy is tamoxifen for 5 years   (a) patient declined adjuvant chemotherapy  (3) adjuvant radiation 03/19/2015-05/02/2015: Left breast 50.4 Gy in 28 fx's; lumpectomy cavity boost 10 Gy in 5 fx's  (4) on anastrozole as of November 2016  (a) bone density 03/10/2016 showed a T score of -2.2.   PLAN: Donnette will soon be 4 years out from definitive surgery for her breast cancer with no evidence of disease recurrence.  This is favorable.  She is tolerating anastrozole well and the plan will be to continue that to a total of 5 years.  She does have some osteopenia.  We are going to repeat a bone density when she has her next mammogram in October of this year at the breast center.  For the nighttime hot flashes we are going to try gabapentin at bedtime.  She will let me know if this does not work for her.  She knows to call for any other issues that may develop before the next visit.  Maurizio Geno, Virgie Dad, MD  08/30/18 1:26 PM Medical Oncology and Hematology [facility] [address] [place], [place] [address] Tel. [phone]    Fax. [phone]  I, Jacqualyn Posey am acting as a Education administrator for Chauncey Cruel, MD.   I, Lurline Del MD, have reviewed the above documentation for accuracy and completeness, and I agree with the above.

## 2018-08-30 ENCOUNTER — Inpatient Hospital Stay: Payer: 59 | Attending: Oncology

## 2018-08-30 ENCOUNTER — Inpatient Hospital Stay (HOSPITAL_BASED_OUTPATIENT_CLINIC_OR_DEPARTMENT_OTHER): Payer: 59 | Admitting: Oncology

## 2018-08-30 ENCOUNTER — Other Ambulatory Visit: Payer: Self-pay

## 2018-08-30 ENCOUNTER — Telehealth: Payer: Self-pay | Admitting: Oncology

## 2018-08-30 VITALS — BP 161/80 | HR 78 | Temp 98.2°F | Resp 18 | Ht 63.5 in | Wt 159.4 lb

## 2018-08-30 DIAGNOSIS — M8580 Other specified disorders of bone density and structure, unspecified site: Secondary | ICD-10-CM | POA: Insufficient documentation

## 2018-08-30 DIAGNOSIS — Z79811 Long term (current) use of aromatase inhibitors: Secondary | ICD-10-CM | POA: Insufficient documentation

## 2018-08-30 DIAGNOSIS — Z79899 Other long term (current) drug therapy: Secondary | ICD-10-CM | POA: Insufficient documentation

## 2018-08-30 DIAGNOSIS — I10 Essential (primary) hypertension: Secondary | ICD-10-CM | POA: Diagnosis not present

## 2018-08-30 DIAGNOSIS — C50212 Malignant neoplasm of upper-inner quadrant of left female breast: Secondary | ICD-10-CM

## 2018-08-30 DIAGNOSIS — Z170 Estrogen receptor positive status [ER+]: Secondary | ICD-10-CM

## 2018-08-30 DIAGNOSIS — Z923 Personal history of irradiation: Secondary | ICD-10-CM | POA: Diagnosis not present

## 2018-08-30 DIAGNOSIS — R232 Flushing: Secondary | ICD-10-CM | POA: Insufficient documentation

## 2018-08-30 LAB — CBC WITH DIFFERENTIAL (CANCER CENTER ONLY)
ABS IMMATURE GRANULOCYTES: 0.03 10*3/uL (ref 0.00–0.07)
BASOS PCT: 1 %
Basophils Absolute: 0.1 10*3/uL (ref 0.0–0.1)
EOS ABS: 0.1 10*3/uL (ref 0.0–0.5)
Eosinophils Relative: 1 %
HEMATOCRIT: 37.3 % (ref 36.0–46.0)
Hemoglobin: 13.3 g/dL (ref 12.0–15.0)
IMMATURE GRANULOCYTES: 0 %
LYMPHS ABS: 2.3 10*3/uL (ref 0.7–4.0)
Lymphocytes Relative: 29 %
MCH: 29 pg (ref 26.0–34.0)
MCHC: 35.7 g/dL (ref 30.0–36.0)
MCV: 81.4 fL (ref 80.0–100.0)
MONO ABS: 0.6 10*3/uL (ref 0.1–1.0)
MONOS PCT: 8 %
NEUTROS ABS: 4.9 10*3/uL (ref 1.7–7.7)
NEUTROS PCT: 61 %
PLATELETS: 298 10*3/uL (ref 150–400)
RBC: 4.58 MIL/uL (ref 3.87–5.11)
RDW: 14.2 % (ref 11.5–15.5)
WBC Count: 7.9 10*3/uL (ref 4.0–10.5)
nRBC: 0 % (ref 0.0–0.2)

## 2018-08-30 LAB — CMP (CANCER CENTER ONLY)
ALBUMIN: 4 g/dL (ref 3.5–5.0)
ALT: 24 U/L (ref 0–44)
AST: 21 U/L (ref 15–41)
Alkaline Phosphatase: 58 U/L (ref 38–126)
Anion gap: 11 (ref 5–15)
BILIRUBIN TOTAL: 0.7 mg/dL (ref 0.3–1.2)
BUN: 18 mg/dL (ref 8–23)
CALCIUM: 9.4 mg/dL (ref 8.9–10.3)
CO2: 27 mmol/L (ref 22–32)
Chloride: 103 mmol/L (ref 98–111)
Creatinine: 0.87 mg/dL (ref 0.44–1.00)
GFR, Est AFR Am: >60 mL/min (ref >60)
GFR, Estimated: >60 mL/min (ref >60)
GLUCOSE: 90 mg/dL (ref 70–99)
Potassium: 3.7 mmol/L (ref 3.5–5.1)
SODIUM: 141 mmol/L (ref 135–145)
Total Protein: 7.7 g/dL (ref 6.5–8.1)

## 2018-08-30 MED ORDER — GABAPENTIN 300 MG PO CAPS: 300.0000 mg | ORAL_CAPSULE | Freq: Every day | ORAL | 4 refills | Status: DC

## 2018-08-30 MED ORDER — ANASTROZOLE 1 MG PO TABS: 1.0000 mg | ORAL_TABLET | Freq: Every day | ORAL | 4 refills | Status: DC

## 2018-08-30 MED FILL — GABAPENTIN 300 MG CAPSULE: 300 | 90 days supply | Qty: 90 | Fill #0

## 2018-08-30 NOTE — Telephone Encounter (Signed)
Gave avs and calendar ° °

## 2018-09-11 ENCOUNTER — Other Ambulatory Visit: Payer: Self-pay | Admitting: Family Medicine

## 2018-09-12 ENCOUNTER — Other Ambulatory Visit: Payer: Self-pay | Admitting: Family Medicine

## 2018-09-13 MED FILL — ANASTROZOLE 1 MG TABLET: 1 | 90 days supply | Qty: 90 | Fill #0

## 2018-09-13 MED FILL — HYDROCHLOROTHIAZIDE 50 MG T: 50 | 90 days supply | Qty: 45 | Fill #0

## 2018-11-17 MED FILL — ATORVASTATIN 40 MG TABLET: 40 | 90 days supply | Qty: 90 | Fill #3

## 2018-11-20 ENCOUNTER — Ambulatory Visit (INDEPENDENT_AMBULATORY_CARE_PROVIDER_SITE_OTHER): Payer: 59 | Admitting: Internal Medicine

## 2018-11-20 ENCOUNTER — Other Ambulatory Visit: Payer: Self-pay

## 2018-11-20 ENCOUNTER — Encounter: Payer: Self-pay | Admitting: Internal Medicine

## 2018-11-20 VITALS — BP 152/88 | HR 61 | Temp 98.2°F | Ht 63.5 in | Wt 155.6 lb

## 2018-11-20 DIAGNOSIS — M25572 Pain in left ankle and joints of left foot: Secondary | ICD-10-CM

## 2018-11-20 DIAGNOSIS — I10 Essential (primary) hypertension: Secondary | ICD-10-CM | POA: Diagnosis not present

## 2018-11-20 DIAGNOSIS — M8580 Other specified disorders of bone density and structure, unspecified site: Secondary | ICD-10-CM | POA: Diagnosis not present

## 2018-11-20 DIAGNOSIS — E785 Hyperlipidemia, unspecified: Secondary | ICD-10-CM

## 2018-11-20 DIAGNOSIS — Z79899 Other long term (current) drug therapy: Secondary | ICD-10-CM | POA: Diagnosis not present

## 2018-11-20 DIAGNOSIS — D229 Melanocytic nevi, unspecified: Secondary | ICD-10-CM | POA: Insufficient documentation

## 2018-11-20 DIAGNOSIS — G8929 Other chronic pain: Secondary | ICD-10-CM | POA: Diagnosis not present

## 2018-11-20 MED ORDER — LISINOPRIL 10 MG PO TABS: 10.0000 mg | ORAL_TABLET | Freq: Every day | ORAL | 1 refills | Status: DC

## 2018-11-20 MED ORDER — HYDROCHLOROTHIAZIDE 25 MG PO TABS: 25.0000 mg | ORAL_TABLET | Freq: Every day | ORAL | 2 refills | Status: DC

## 2018-11-20 MED FILL — HYDROCHLOROTHIAZIDE 25 MG T: 25 | 30 days supply | Qty: 30 | Fill #0

## 2018-11-20 MED FILL — LISINOPRIL 10 MG TABS: 10 | 30 days supply | Qty: 30 | Fill #0

## 2018-11-20 NOTE — Assessment & Plan Note (Signed)
Patient and her daughter have noted changes in a mole on her RLE. The mole used to be more heart shaped, but has evoled to a different shape. She also states she has not noticed there being multiple shades in the past, but is not sure.  On exam there is an atypical Mole on RLE with irregular border, asymmetry, Multiple shades of color, see photo.  She is in need of biopsy and pathologic evaluation. Given the thin thinness of her skin and lack of subcutaneous tissue at the site will refer to dermatology for more experienced provider to perform this biopsy. - Dermatology referral.

## 2018-11-20 NOTE — Assessment & Plan Note (Signed)
Patient has history of osteopenia. Her oncologist has been following this and has ordered a repeat DEXA. She is currently taking daily Ca and Vit D. - DEXA as ordered - Ca and Vit Supplementation

## 2018-11-20 NOTE — Patient Instructions (Addendum)
Thank you for allowing [place] to care for you  For your blood pressure - BP elevated today at 159/94 - Continue HCTZ 25mg  Daily - We have added Lisinopril 10mg  Daily - Please follow up in about 1 month for labs and BP check  For your atypical mole - We have placed a referral for you to see a dermatologist for biopsy (taking a sample) of this - Please call out office if you have not received a call to make an appointment in the next 2 weeks  Our records indicate you are due for a TDAP vaccine - Please call "Health at Work" for [employer] employees to verify if you need the vaccine - They will provide the vaccine if you are due  We have provided a form for record requests for your heart ultrasound (echocardiogram)  Please follow up in about 1 month for blood pressure recheck

## 2018-11-20 NOTE — Assessment & Plan Note (Signed)
Patient report some chronic use related pain of her left ankle that will occasionally have some associated swelling.  The pain occurs with use throughout the workday. She states the pain has never been severe enough to warrant taking regular OTC medication. - Continue to monitor

## 2018-11-20 NOTE — Progress Notes (Signed)
CC: Establish Care, Hypertension, Hyperlipidemia, Atypical Mole, left ankle pain  HPI:  [name] is a 68 y.o. F with PMHx listed below presenting for Establish Care, Hypertension, Hyperlipidemia, Atypical Mole, left ankle pain. Please see the A&P for the status of the patient's chronic medical problems.   Past Medical History:  Diagnosis Date  . Breast cancer of upper-inner quadrant of left female breast ([place]) 01/27/2015  . Hypertension Dx 2008  . Personal history of radiation therapy   . Radiation 03/19/15-05/02/15   left breast   Past Surgical History:  Procedure Laterality Date  . ABDOMINAL HYSTERECTOMY  1980   uterine prolapse   . BREAST LUMPECTOMY Left 2016  . BREAST LUMPECTOMY WITH RADIOACTIVE SEED AND SENTINEL LYMPH NODE BIOPSY Left 02/06/2015   Procedure: LEFT BREAST LUMPECTOMY WITH RADIOACTIVE SEED AND LEFT AXILLARY SENTINEL LYMPH NODE BIOPSY;  Surgeon: Excell Seltzer, MD;  Location: [facility];  Service: General;  Laterality: Left;  . TUBAL LIGATION  1970   Social History   Socioeconomic History  . Marital status: Divorced    Spouse name: Not on file  . Number of children: 4  . Years of education: 9   . Highest education level: Not on file  Occupational History  . Occupation: Student     Comment: GED  . Occupation: Assited Living     Comment: [place]  . Financial resource strain: Not on file  . Food insecurity:    Worry: Not on file    Inability: Not on file  . Transportation needs:    Medical: Not on file    Non-medical: Not on file  Tobacco Use  . Smoking status: Never Smoker  . Smokeless tobacco: Never Used  Substance and Sexual Activity  . Alcohol use: Yes    Comment: occas  . Drug use: No    Comment: in the past cocaine. none since 2010.   Marland Kitchen Sexual activity: Not on file  Lifestyle  . Physical activity:    Days per week: Not on file    Minutes per session: Not on file  . Stress: Not on file   Relationships  . Social connections:    Talks on phone: Not on file    Gets together: Not on file    Attends religious service: Not on file    Active member of club or organization: Not on file    Attends meetings of clubs or organizations: Not on file    Relationship status: Not on file  Other Topics Concern  . Not on file  Social History Narrative   Had 4 children.    3 living.   One child died at 42 months, SIDs.    Lives alone.    Children in town.    Family History  Problem Relation Age of Onset  . Alzheimer's disease Father   . Cancer Mother        throat cancer- smoker and ETOH   . Heart disease Neg Hx   . Diabetes Neg Hx    Review of Systems:  Performed and all others negative.  Physical Exam:  Vitals:   11/20/18 1513  BP: (!) 159/94  Pulse: 61  Temp: 98.2 F (36.8 C)  TempSrc: Oral  SpO2: 100%  Weight: 155 lb 9.6 oz (70.6 kg)  Height: 5' 3.5" (1.613 m)   Physical Exam Constitutional:      General: She is not in acute distress.    Appearance: Normal  appearance.  Cardiovascular:     Rate and Rhythm: Normal rate and regular rhythm.     Pulses: Normal pulses.     Heart sounds: Normal heart sounds.  Pulmonary:     Effort: Pulmonary effort is normal. No respiratory distress.     Breath sounds: Normal breath sounds.  Abdominal:     General: Bowel sounds are normal. There is no distension.     Palpations: Abdomen is soft.     Tenderness: There is no abdominal tenderness.  Musculoskeletal:        General: No swelling or deformity.  Skin:    General: Skin is warm and dry.     Comments: Atypical Mole on RLE with irregular border, asymmetry, Multiple shades of color, see photo  Neurological:     General: No focal deficit present.     Mental Status: Mental status is at baseline.       Assessment & Plan:   See Encounters Tab for problem based charting.  Patient discussed with Dr. Lynnae January

## 2018-11-20 NOTE — Assessment & Plan Note (Signed)
BP 159/94 today. BP was elevated in Dec 2019 when she had established with [facility]. She was instructed to increase HCTZ to 50mg  Daily, but has not done so.   Given limited benefit of increasing HCTZ above 25mg  Daily; will instead add additional agent of Lisinopril. Will also change HCTZ strength so she can stop breaking these in half.  She tries to take as few medications as possible so will transition to combination when able after finding appropriate dose.  - HCTZ 25mg  Daily - Start Lisinopril 10mg  Daily - Follow up in 1 month for BP and BMP

## 2018-11-24 NOTE — Progress Notes (Signed)
Internal Medicine Clinic Attending  Case discussed with Dr. Melvin  at the time of the visit.  We reviewed the resident's history and exam and pertinent patient test results.  I agree with the assessment, diagnosis, and plan of care documented in the resident's note.  

## 2018-12-07 MED FILL — ANASTROZOLE 1 MG TABLET: 1 | 90 days supply | Qty: 90 | Fill #0

## 2018-12-21 ENCOUNTER — Encounter: Payer: Self-pay | Admitting: Internal Medicine

## 2018-12-21 ENCOUNTER — Other Ambulatory Visit: Payer: Self-pay

## 2018-12-21 ENCOUNTER — Ambulatory Visit (INDEPENDENT_AMBULATORY_CARE_PROVIDER_SITE_OTHER): Payer: 59 | Admitting: Internal Medicine

## 2018-12-21 VITALS — BP 157/89 | HR 75 | Temp 98.1°F | Ht 64.2 in | Wt 155.1 lb

## 2018-12-21 DIAGNOSIS — Z79899 Other long term (current) drug therapy: Secondary | ICD-10-CM | POA: Diagnosis not present

## 2018-12-21 DIAGNOSIS — I10 Essential (primary) hypertension: Secondary | ICD-10-CM | POA: Diagnosis not present

## 2018-12-21 MED ORDER — AMLODIPINE BESYLATE 5 MG PO TABS: 5.0000 mg | ORAL_TABLET | Freq: Every day | ORAL | 11 refills | Status: DC

## 2018-12-21 MED FILL — HYDROCHLOROTHIAZIDE 25 MG T: 25 | 30 days supply | Qty: 30 | Fill #1

## 2018-12-21 MED FILL — AMLODIPINE BESYLATE 5 MG TA: 5 | 30 days supply | Qty: 30 | Fill #0

## 2018-12-21 NOTE — Progress Notes (Signed)
   CC: Hypertension  HPI:  [name] is a 69 y.o. F with PMHx listed below presenting for Hypertension. Please see the A&P for the status of the patient's chronic medical problems.  Past Medical History:  Diagnosis Date  . Breast cancer of upper-inner quadrant of left female breast ([place]) 01/27/2015  . Hypertension Dx 2008  . Osteopenia   . Personal history of radiation therapy   . Radiation 03/19/15-05/02/15   left breast   Review of Systems: Performed and all others negative.  Physical Exam:  Vitals:   12/21/18 1541  BP: (!) 157/89  Pulse: 75  Temp: 98.1 F (36.7 C)  TempSrc: Oral  SpO2: 100%  Weight: 155 lb 1.6 oz (70.4 kg)  Height: 5' 4.2" (1.631 m)   Physical Exam Constitutional:      General: She is not in acute distress.    Appearance: Normal appearance.  Cardiovascular:     Rate and Rhythm: Normal rate and regular rhythm.     Pulses: Normal pulses.     Heart sounds: Normal heart sounds.  Pulmonary:     Effort: Pulmonary effort is normal. No respiratory distress.     Breath sounds: Normal breath sounds.  Abdominal:     General: Bowel sounds are normal. There is no distension.     Palpations: Abdomen is soft.     Tenderness: There is no abdominal tenderness.  Musculoskeletal:        General: No swelling or deformity.  Skin:    General: Skin is warm and dry.  Neurological:     General: No focal deficit present.     Mental Status: Mental status is at baseline.     Assessment & Plan:   See Encounters Tab for problem based charting.  Patient discussed with Dr. Evette Doffing

## 2018-12-21 NOTE — Assessment & Plan Note (Signed)
BP elevated today at 157/89. Patient was started on Lisinopril at her last visit, but states she experienced fatigue and headaches while taking it. She tried stopping the medication and her symptoms resolved; so she has not resumed the medication.   Again explained that she is at maximal effective dose of HCTZ and discussed starting Amlodipine as an alternative, which she is interested in trying. - Continue HCTZ 15mg  Daily - Start Amlodipine 5mg  Daily - Follow up in 1 month for lab work, BP check, and medication titration

## 2018-12-21 NOTE — Patient Instructions (Signed)
Thank you for allowing [place] to care for you  For you blood pressure - BP elevated today, this is expected since you stopped taking Lisinopril - We have stopped Lisinopril - Please start taking Amlodipine 5mg  Daily  Follow up for lab work and BP check in about 1 month

## 2018-12-22 NOTE — Progress Notes (Signed)
Internal Medicine Clinic Attending  Case discussed with Dr. Trilby Drummer  at the time of the visit.  We reviewed the resident's history and exam and pertinent patient test results.  I agree with the assessment, diagnosis, and plan of care documented in the resident's note.  As a correction, HCTZ dose is 25mg  daily for hypertension.

## 2019-01-18 MED FILL — AMLODIPINE BESYLATE 5 MG TA: 5 | 30 days supply | Qty: 30 | Fill #1

## 2019-01-18 MED FILL — HYDROCHLOROTHIAZIDE 25 MG T: 25 | 30 days supply | Qty: 30 | Fill #2

## 2019-02-14 ENCOUNTER — Other Ambulatory Visit: Payer: Self-pay | Admitting: *Deleted

## 2019-02-14 ENCOUNTER — Other Ambulatory Visit: Payer: Self-pay | Admitting: Family Medicine

## 2019-02-14 MED ORDER — ATORVASTATIN CALCIUM 40 MG PO TABS: 40.0000 mg | ORAL_TABLET | Freq: Every day | ORAL | 3 refills | Status: DC

## 2019-02-14 MED FILL — ATORVASTATIN 40 MG TABLET: 40 | 90 days supply | Qty: 90 | Fill #0

## 2019-02-14 NOTE — Telephone Encounter (Signed)
Refill approved.

## 2019-02-15 ENCOUNTER — Other Ambulatory Visit: Payer: Self-pay | Admitting: Internal Medicine

## 2019-02-15 DIAGNOSIS — I10 Essential (primary) hypertension: Secondary | ICD-10-CM

## 2019-02-15 MED FILL — HYDROCHLOROTHIAZIDE 25 MG T: 25 | 30 days supply | Qty: 30 | Fill #0

## 2019-02-15 MED FILL — AMLODIPINE BESYLATE 5 MG TA: 5 | 30 days supply | Qty: 30 | Fill #2

## 2019-02-15 NOTE — Telephone Encounter (Signed)
Refill approved.

## 2019-03-07 ENCOUNTER — Other Ambulatory Visit: Payer: Self-pay | Admitting: Oncology

## 2019-03-07 DIAGNOSIS — Z853 Personal history of malignant neoplasm of breast: Secondary | ICD-10-CM

## 2019-03-09 MED FILL — ANASTROZOLE 1 MG TABLET: 1 | 90 days supply | Qty: 90 | Fill #1

## 2019-03-12 ENCOUNTER — Ambulatory Visit
Admission: RE | Admit: 2019-03-12 | Discharge: 2019-03-12 | Disposition: A | Discharge status: Home / Self Care | Payer: 59 | Source: Ambulatory Visit | Attending: Oncology | Admitting: Oncology

## 2019-03-12 ENCOUNTER — Other Ambulatory Visit: Payer: Self-pay

## 2019-03-12 DIAGNOSIS — M810 Age-related osteoporosis without current pathological fracture: Secondary | ICD-10-CM | POA: Diagnosis not present

## 2019-03-12 DIAGNOSIS — Z780 Asymptomatic menopausal state: Secondary | ICD-10-CM | POA: Diagnosis not present

## 2019-03-12 DIAGNOSIS — M8580 Other specified disorders of bone density and structure, unspecified site: Secondary | ICD-10-CM

## 2019-03-12 DIAGNOSIS — C50212 Malignant neoplasm of upper-inner quadrant of left female breast: Secondary | ICD-10-CM

## 2019-03-12 DIAGNOSIS — Z170 Estrogen receptor positive status [ER+]: Secondary | ICD-10-CM

## 2019-03-19 ENCOUNTER — Encounter: Payer: 59 | Admitting: Internal Medicine

## 2019-03-20 MED FILL — AMLODIPINE BESYLATE 5 MG TA: 5 | 30 days supply | Qty: 30 | Fill #3

## 2019-03-20 MED FILL — HYDROCHLOROTHIAZIDE 25 MG T: 25 | 30 days supply | Qty: 30 | Fill #1

## 2019-03-26 ENCOUNTER — Encounter: Payer: Self-pay | Admitting: Internal Medicine

## 2019-03-26 ENCOUNTER — Ambulatory Visit (INDEPENDENT_AMBULATORY_CARE_PROVIDER_SITE_OTHER): Payer: 59 | Admitting: Internal Medicine

## 2019-03-26 ENCOUNTER — Other Ambulatory Visit: Payer: Self-pay

## 2019-03-26 VITALS — BP 149/79 | HR 77 | Temp 98.0°F | Ht 64.2 in | Wt 153.5 lb

## 2019-03-26 DIAGNOSIS — D229 Melanocytic nevi, unspecified: Secondary | ICD-10-CM | POA: Diagnosis not present

## 2019-03-26 DIAGNOSIS — M8580 Other specified disorders of bone density and structure, unspecified site: Secondary | ICD-10-CM

## 2019-03-26 DIAGNOSIS — I10 Essential (primary) hypertension: Secondary | ICD-10-CM | POA: Diagnosis not present

## 2019-03-26 DIAGNOSIS — Z79899 Other long term (current) drug therapy: Secondary | ICD-10-CM

## 2019-03-26 DIAGNOSIS — Z0000 Encounter for general adult medical examination without abnormal findings: Secondary | ICD-10-CM | POA: Insufficient documentation

## 2019-03-26 MED ORDER — AMLODIPINE BESYLATE 10 MG PO TABS: 10.0000 mg | ORAL_TABLET | Freq: Every day | ORAL | 3 refills | Status: DC

## 2019-03-26 MED FILL — AMLODIPINE BESYLATE 10 MG T: 10 | 90 days supply | Qty: 90 | Fill #0

## 2019-03-26 NOTE — Assessment & Plan Note (Addendum)
DEXA results show score of -2.5, putting her at the lower limit of osteopenia. Will repeat in 2 years and have her continue with Calcium and Vit D supplementation. - Continue Ca and Vit D - Repeat DEXA in about 2 years

## 2019-03-26 NOTE — Assessment & Plan Note (Signed)
Patient was unable to reschedule her dermatology appointment for her atypical mole due to family events. She was provided with there number and informed we would re-refer her if needed. - Schedule Dermatology Visit

## 2019-03-26 NOTE — Progress Notes (Signed)
   CC: Hypertension, Preventative Health Care, Osteopenia, Atypical mole   HPI:  Marissa Williams is a 68 y.o. F with PMHx listed below presenting for Hypertension, Preventative Health Care, Osteopenia, Atypical mole. Please see the A&P for the status of the patient's chronic medical problems.   Past Medical History:  Diagnosis Date  . Breast cancer of upper-inner quadrant of left female breast ([place]) 01/27/2015  . Hypertension Dx 2008  . Osteopenia   . Personal history of radiation therapy   . Radiation 03/19/15-05/02/15   left breast   Review of Systems:  Performed and all others negative.  Physical Exam:  Vitals:   03/26/19 1310  BP: (!) 149/79  Pulse: 77  Temp: 98 F (36.7 C)  TempSrc: Oral  SpO2: 100%  Weight: 153 lb 8 oz (69.6 kg)   Physical Exam Constitutional:      General: She is not in acute distress.    Appearance: Normal appearance.  Cardiovascular:     Rate and Rhythm: Normal rate and regular rhythm.     Pulses: Normal pulses.     Heart sounds: Normal heart sounds.  Pulmonary:     Effort: Pulmonary effort is normal. No respiratory distress.     Breath sounds: Normal breath sounds.  Abdominal:     General: Bowel sounds are normal. There is no distension.     Palpations: Abdomen is soft.     Tenderness: There is no abdominal tenderness.  Musculoskeletal:        General: No swelling or deformity.  Skin:    General: Skin is warm and dry.  Neurological:     General: No focal deficit present.     Mental Status: Mental status is at baseline.    Assessment & Plan:   See Encounters Tab for problem based charting.  Patient discussed with Dr. Evette Doffing

## 2019-03-26 NOTE — Patient Instructions (Addendum)
Thank you for allowing [place] to care for you  For your high blood pressure - BP improved, but remains elevated today - We will increase your dose of Amlodipine to 10mg  Daily  Please ger your flu shot through employee health  Please call dermatology for appointment @ [phone]  Our fax number is [phone]  Please follow up in about 3 months

## 2019-03-26 NOTE — Assessment & Plan Note (Addendum)
Patient is up to date on most preventive measure - She is going to request records from employer to confirm if she needs TDAP vaccine - She is to have Flu shot through employer for insurance and records purposes - She is going to have records from another office sent to [place] concerning a possible echocardiogram she had in the past

## 2019-03-26 NOTE — Assessment & Plan Note (Addendum)
BP 149/79 today, this is Improved from 157/89 at last visit. She was started on Amlodipine at that time, will increase dose to 10mg  Daily today and have patient follow up to evaluate response. If she is not at goal at follow up, will start with changing HCTZ to chlorthalidone, which will allow for possible increase to 50mg  (which is not efficacious with HCTZ). - HCTZ 25mg  Daily - Increase Amlodipine to 10mg  Daily

## 2019-03-27 NOTE — Progress Notes (Signed)
Internal Medicine Clinic Attending  Case discussed with Dr. Melvin  at the time of the visit.  We reviewed the resident's history and exam and pertinent patient test results.  I agree with the assessment, diagnosis, and plan of care documented in the resident's note.  

## 2019-03-28 MED FILL — GABAPENTIN 300 MG CAPSULE: 300 | 90 days supply | Qty: 90 | Fill #1

## 2019-03-28 NOTE — Addendum Note (Signed)
Addended by: Lalla Brothers T on: 03/28/2019 04:37 PM   Modules accepted: Level of Service

## 2019-04-09 ENCOUNTER — Other Ambulatory Visit: Payer: Self-pay

## 2019-04-09 ENCOUNTER — Ambulatory Visit
Admission: RE | Admit: 2019-04-09 | Discharge: 2019-04-09 | Disposition: A | Discharge status: Home / Self Care | Payer: 59 | Source: Ambulatory Visit | Attending: Oncology | Admitting: Oncology

## 2019-04-09 DIAGNOSIS — Z853 Personal history of malignant neoplasm of breast: Secondary | ICD-10-CM | POA: Diagnosis not present

## 2019-04-09 DIAGNOSIS — R928 Other abnormal and inconclusive findings on diagnostic imaging of breast: Secondary | ICD-10-CM | POA: Diagnosis not present

## 2019-04-17 MED FILL — HYDROCHLOROTHIAZIDE 25 MG T: 25 | 30 days supply | Qty: 30 | Fill #2

## 2019-05-14 ENCOUNTER — Other Ambulatory Visit: Payer: Self-pay | Admitting: Internal Medicine

## 2019-05-14 DIAGNOSIS — I10 Essential (primary) hypertension: Secondary | ICD-10-CM

## 2019-05-14 MED FILL — HYDROCHLOROTHIAZIDE 25 MG T: 25 | 90 days supply | Qty: 90 | Fill #0

## 2019-05-14 MED FILL — ATORVASTATIN 40 MG TABLET: 40 | 90 days supply | Qty: 90 | Fill #1

## 2019-05-14 NOTE — Telephone Encounter (Signed)
Refill approved.

## 2019-06-25 MED FILL — ANASTROZOLE 1 MG TABLET: 1 | 90 days supply | Qty: 90 | Fill #2

## 2019-07-02 MED FILL — AMLODIPINE BESYLATE 10 MG T: 10 | 90 days supply | Qty: 90 | Fill #1

## 2019-08-13 MED FILL — ATORVASTATIN 40 MG TABLET: 40 | 90 days supply | Qty: 90 | Fill #2

## 2019-08-13 MED FILL — HYDROCHLOROTHIAZIDE 25 MG T: 25 | 90 days supply | Qty: 90 | Fill #1

## 2019-08-16 MED FILL — GABAPENTIN 300 MG CAPSULE: 300 | 90 days supply | Qty: 90 | Fill #2

## 2019-08-29 ENCOUNTER — Other Ambulatory Visit: Payer: Self-pay | Admitting: *Deleted

## 2019-08-29 DIAGNOSIS — Z170 Estrogen receptor positive status [ER+]: Secondary | ICD-10-CM

## 2019-08-29 DIAGNOSIS — C50212 Malignant neoplasm of upper-inner quadrant of left female breast: Secondary | ICD-10-CM

## 2019-08-30 ENCOUNTER — Ambulatory Visit: Payer: 59 | Admitting: Oncology

## 2019-08-30 ENCOUNTER — Other Ambulatory Visit: Payer: 59

## 2019-09-17 ENCOUNTER — Other Ambulatory Visit: Payer: Self-pay | Admitting: Oncology

## 2019-09-17 MED FILL — ANASTROZOLE 1 MG TABLET: 1 | 30 days supply | Qty: 30 | Fill #0

## 2019-09-26 ENCOUNTER — Telehealth: Payer: Self-pay | Admitting: Oncology

## 2019-09-26 NOTE — Telephone Encounter (Signed)
Called pt back per 4/13 sch message - no answer - left message for pt to call back to reschedule appt

## 2019-09-30 ENCOUNTER — Other Ambulatory Visit: Payer: Self-pay

## 2019-09-30 ENCOUNTER — Emergency Department (HOSPITAL_COMMUNITY)
Admission: EM | Admit: 2019-09-30 | Discharge: 2019-09-30 | Discharge status: Against Medical Advice | Payer: Medicare Other

## 2019-09-30 NOTE — ED Notes (Signed)
No answer x 1 for triage

## 2019-09-30 NOTE — ED Notes (Signed)
This pt has been called back to triage multiple times w/ no answer. Will be marked LWBS.

## 2019-10-04 MED FILL — AMLODIPINE BESYLATE 10 MG T: 10 | 90 days supply | Qty: 90 | Fill #2

## 2019-10-06 ENCOUNTER — Other Ambulatory Visit: Payer: Self-pay

## 2019-10-06 ENCOUNTER — Encounter (HOSPITAL_COMMUNITY): Payer: Self-pay

## 2019-10-06 DIAGNOSIS — Z853 Personal history of malignant neoplasm of breast: Secondary | ICD-10-CM | POA: Insufficient documentation

## 2019-10-06 DIAGNOSIS — Z79899 Other long term (current) drug therapy: Secondary | ICD-10-CM | POA: Insufficient documentation

## 2019-10-06 DIAGNOSIS — N23 Unspecified renal colic: Secondary | ICD-10-CM | POA: Diagnosis not present

## 2019-10-06 DIAGNOSIS — R109 Unspecified abdominal pain: Secondary | ICD-10-CM | POA: Diagnosis not present

## 2019-10-06 DIAGNOSIS — N2889 Other specified disorders of kidney and ureter: Secondary | ICD-10-CM | POA: Diagnosis not present

## 2019-10-06 DIAGNOSIS — I10 Essential (primary) hypertension: Secondary | ICD-10-CM | POA: Insufficient documentation

## 2019-10-06 DIAGNOSIS — R319 Hematuria, unspecified: Secondary | ICD-10-CM | POA: Diagnosis not present

## 2019-10-06 NOTE — ED Triage Notes (Signed)
Left sided flank pain for 4 days.

## 2019-10-07 ENCOUNTER — Emergency Department (HOSPITAL_COMMUNITY): Payer: Medicare Other

## 2019-10-07 ENCOUNTER — Emergency Department (HOSPITAL_COMMUNITY)
Admission: EM | Admit: 2019-10-07 | Discharge: 2019-10-07 | Disposition: A | Discharge status: Home / Self Care | Payer: Medicare Other | Attending: Emergency Medicine | Admitting: Emergency Medicine

## 2019-10-07 DIAGNOSIS — N23 Unspecified renal colic: Secondary | ICD-10-CM | POA: Diagnosis not present

## 2019-10-07 DIAGNOSIS — R319 Hematuria, unspecified: Secondary | ICD-10-CM | POA: Diagnosis not present

## 2019-10-07 DIAGNOSIS — N2889 Other specified disorders of kidney and ureter: Secondary | ICD-10-CM

## 2019-10-07 DIAGNOSIS — R109 Unspecified abdominal pain: Secondary | ICD-10-CM | POA: Diagnosis not present

## 2019-10-07 LAB — URINALYSIS, ROUTINE W REFLEX MICROSCOPIC
Bacteria, UA: NONE SEEN
Bilirubin Urine: NEGATIVE
Glucose, UA: NEGATIVE mg/dL
Ketones, ur: NEGATIVE mg/dL
Nitrite: NEGATIVE
Protein, ur: NEGATIVE mg/dL
Specific Gravity, Urine: 1.013 (ref 1.005–1.030)
pH: 6 (ref 5.0–8.0)

## 2019-10-07 LAB — BASIC METABOLIC PANEL
Anion gap: 11 (ref 5–15)
BUN: 18 mg/dL (ref 8–23)
CO2: 28 mmol/L (ref 22–32)
Calcium: 9.6 mg/dL (ref 8.9–10.3)
Chloride: 100 mmol/L (ref 98–111)
Creatinine, Ser: 0.62 mg/dL (ref 0.44–1.00)
GFR calc Af Amer: >60 mL/min (ref >60)
GFR calc non Af Amer: >60 mL/min (ref >60)
Glucose, Bld: 107 mg/dL — ABNORMAL HIGH (ref 70–99)
Potassium: 3.4 mmol/L — ABNORMAL LOW (ref 3.5–5.1)
Sodium: 139 mmol/L (ref 135–145)

## 2019-10-07 LAB — CBC
HCT: 36.4 % (ref 36.0–46.0)
Hemoglobin: 13.3 g/dL (ref 12.0–15.0)
MCH: 29.4 pg (ref 26.0–34.0)
MCHC: 36.5 g/dL — ABNORMAL HIGH (ref 30.0–36.0)
MCV: 80.5 fL (ref 80.0–100.0)
Platelets: 282 10*3/uL (ref 150–400)
RBC: 4.52 MIL/uL (ref 3.87–5.11)
RDW: 13.2 % (ref 11.5–15.5)
WBC: 8.3 10*3/uL (ref 4.0–10.5)
nRBC: 0 % (ref 0.0–0.2)

## 2019-10-07 MED ORDER — MORPHINE SULFATE (PF) 4 MG/ML IV SOLN
4.0000 mg | Freq: Once | INTRAVENOUS | Status: AC | PRN
  Administered 2019-10-07: 06:00:00 4 mg via INTRAVENOUS
  Filled 2019-10-07: qty 1

## 2019-10-07 MED ORDER — OXYCODONE-ACETAMINOPHEN 5-325 MG PO TABS: 1.0000 | ORAL_TABLET | ORAL | 0 refills | Status: DC | PRN

## 2019-10-07 MED ORDER — ONDANSETRON 8 MG PO TBDP: 8.0000 mg | ORAL_TABLET | Freq: Three times a day (TID) | ORAL | 1 refills | Status: DC | PRN

## 2019-10-07 MED ORDER — MORPHINE SULFATE (PF) 4 MG/ML IV SOLN
4.0000 mg | Freq: Once | INTRAVENOUS | Status: AC
  Administered 2019-10-07: 02:00:00 4 mg via INTRAVENOUS
  Filled 2019-10-07: qty 1

## 2019-10-07 MED ORDER — ONDANSETRON HCL 4 MG/2ML IJ SOLN
4.0000 mg | Freq: Once | INTRAMUSCULAR | Status: AC
  Administered 2019-10-07: 4 mg via INTRAVENOUS
  Filled 2019-10-07: qty 2

## 2019-10-07 NOTE — ED Provider Notes (Signed)
[facility] DEPT Provider Note: Georgena Spurling, MD, FACEP  CSN: [id number] MRN: [id number] ARRIVAL: 10/06/19 at 2141 ROOM: [place]  Flank Pain   HISTORY OF PRESENT ILLNESS  10/07/19 1:32 AM Marissa Williams is a 69 y.o. female with 4 days of left flank pain.  She initially thought it was a pulled muscle but it is worsened and is now sharp in nature.  She rates the pain as a 9 out of 10.  Nothing makes the pain better or worse.  It is now radiating to her left lower quadrant.  She has had some nausea with it but no vomiting.  She has not noticed hematuria.  She has taken hydrocodone without relief.   Past Medical History:  Diagnosis Date  . Breast cancer of upper-inner quadrant of left female breast ([place]) 01/27/2015  . Hypertension Dx 2008  . Osteopenia   . Personal history of radiation therapy   . Radiation 03/19/15-05/02/15   left breast    Past Surgical History:  Procedure Laterality Date  . ABDOMINAL HYSTERECTOMY  1980   uterine prolapse   . BREAST LUMPECTOMY Left 2016  . BREAST LUMPECTOMY WITH RADIOACTIVE SEED AND SENTINEL LYMPH NODE BIOPSY Left 02/06/2015   Procedure: LEFT BREAST LUMPECTOMY WITH RADIOACTIVE SEED AND LEFT AXILLARY SENTINEL LYMPH NODE BIOPSY;  Surgeon: Excell Seltzer, MD;  Location: [facility];  Service: General;  Laterality: Left;  . TUBAL LIGATION  1970    Family History  Problem Relation Age of Onset  . Alzheimer's disease Father   . Cancer Mother        throat cancer- smoker and ETOH   . Heart disease Neg Hx   . Diabetes Neg Hx     Social History   Tobacco Use  . Smoking status: Never Smoker  . Smokeless tobacco: Never Used  Substance Use Topics  . Alcohol use: Yes    Alcohol/week: 21.0 standard drinks    Types: 21 Cans of beer per week  . Drug use: No    Comment: in the past cocaine. none since 2010.     Prior to Admission medications   Medication Sig Start Date End Date Taking? Authorizing  Provider  amLODipine (NORVASC) 10 MG tablet Take 1 tablet (10 mg total) by mouth daily. 03/26/19 03/25/20  Neva Seat, MD  anastrozole (ARIMIDEX) 1 MG tablet TAKE 1 TABLET BY MOUTH DAILY. 09/17/19   Magrinat, Virgie Dad, MD  atorvastatin (LIPITOR) 40 MG tablet Take 1 tablet (40 mg total) by mouth daily. 02/14/19   Neva Seat, MD  gabapentin (NEURONTIN) 300 MG capsule Take 1 capsule (300 mg total) by mouth at bedtime. 08/30/18   Magrinat, Virgie Dad, MD  hydrochlorothiazide (HYDRODIURIL) 25 MG tablet TAKE 1 TABLET (25 MG TOTAL) BY MOUTH DAILY. 05/14/19   Neva Seat, MD  ondansetron (ZOFRAN ODT) 8 MG disintegrating tablet Take 1 tablet (8 mg total) by mouth every 8 (eight) hours as needed for nausea or vomiting. 10/07/19   Adrion Menz, Jenny Reichmann, MD  oxyCODONE-acetaminophen (PERCOCET) 5-325 MG tablet Take 1 tablet by mouth every 4 (four) hours as needed for severe pain (may cause constipation). 10/07/19   Jaine Estabrooks, MD  Vitamin D, Ergocalciferol, (DRISDOL) 1.25 MG (50000 UT) CAPS capsule Take 50,000 Units by mouth every 7 (seven) days.    [provider]    Allergies Patient has no known allergies.   REVIEW OF SYSTEMS  Negative except as noted here or in the History of Present Illness.  PHYSICAL EXAMINATION  Initial Vital Signs Blood pressure (!) 147/91, pulse 99, temperature 98.4 F (36.9 C), temperature source Oral, resp. rate 16, height 5\' 3"  (1.6 m), weight 69.9 kg, SpO2 100 %.  Examination General: Well-developed, well-nourished female in no acute distress; appearance consistent with age of record HENT: normocephalic; atraumatic Eyes: pupils equal, round and reactive to light; extraocular muscles intact; cataracts Neck: supple Heart: regular rate and rhythm Lungs: clear to auscultation bilaterally Abdomen: soft; nondistended; nontender; bowel sounds present GU: No CVA tenderness Extremities: No deformity; full range of motion; pulses normal Neurologic: Awake, alert  and oriented; motor function intact in all extremities and symmetric; no facial droop Skin: Warm and dry Psychiatric: Normal mood and affect   RESULTS  Summary of this visit's results, reviewed and interpreted by myself:   EKG Interpretation  Date/Time:    Ventricular Rate:    PR Interval:    QRS Duration:   QT Interval:    QTC Calculation:   R Axis:     Text Interpretation:        Laboratory Studies: Results for orders placed or performed during the hospital encounter of 10/07/19 (from the past 24 hour(s))  Urinalysis, Routine w reflex microscopic- may I&O cath if menses     Status: Abnormal   Collection Time: 10/06/19 10:24 PM  Result Value Ref Range   Color, Urine YELLOW YELLOW   APPearance CLEAR CLEAR   Specific Gravity, Urine 1.013 1.005 - 1.030   pH 6.0 5.0 - 8.0   Glucose, UA NEGATIVE NEGATIVE mg/dL   Hgb urine dipstick MODERATE (A) NEGATIVE   Bilirubin Urine NEGATIVE NEGATIVE   Ketones, ur NEGATIVE NEGATIVE mg/dL   Protein, ur NEGATIVE NEGATIVE mg/dL   Nitrite NEGATIVE NEGATIVE   Leukocytes,Ua MODERATE (A) NEGATIVE   RBC / HPF 11-20 0 - 5 RBC/hpf   WBC, UA 6-10 0 - 5 WBC/hpf   Bacteria, UA NONE SEEN NONE SEEN   Squamous Epithelial / LPF 0-5 0 - 5  Basic metabolic panel     Status: Abnormal   Collection Time: 10/07/19  1:32 AM  Result Value Ref Range   Sodium 139 135 - 145 mmol/L   Potassium 3.4 (L) 3.5 - 5.1 mmol/L   Chloride 100 98 - 111 mmol/L   CO2 28 22 - 32 mmol/L   Glucose, Bld 107 (H) 70 - 99 mg/dL   BUN 18 8 - 23 mg/dL   Creatinine, Ser 0.62 0.44 - 1.00 mg/dL   Calcium 9.6 8.9 - 10.3 mg/dL   GFR calc non Af Amer >60 >60 mL/min   GFR calc Af Amer >60 >60 mL/min   Anion gap 11 5 - 15  CBC     Status: Abnormal   Collection Time: 10/07/19  1:32 AM  Result Value Ref Range   WBC 8.3 4.0 - 10.5 K/uL   RBC 4.52 3.87 - 5.11 MIL/uL   Hemoglobin 13.3 12.0 - 15.0 g/dL   HCT 36.4 36.0 - 46.0 %   MCV 80.5 80.0 - 100.0 fL   MCH 29.4 26.0 - 34.0 pg    MCHC 36.5 (H) 30.0 - 36.0 g/dL   RDW 13.2 11.5 - 15.5 %   Platelets 282 150 - 400 K/uL   nRBC 0.0 0.0 - 0.2 %   Imaging Studies: US Renal  Result Date: 10/07/2019 CLINICAL DATA:  69 year old female with history of hematuria. Left-sided back pain for 1 week, worsening. EXAM: RENAL / URINARY TRACT ULTRASOUND COMPLETE COMPARISON:  CT of  the abdomen and pelvis 10/07/2019. FINDINGS: Right Kidney: Renal measurements: 12 x 5 x 5.9 cm = volume: 183 mL . Echogenicity within normal limits. In the lower pole of the right kidney there is a well-circumscribed 3.1 x 2.3 x 3.3 cm lesion which is generally anechoic with increased through transmission, with exception of a dependent focus of increased echogenicity measuring up to 1.2 cm which demonstrates posterior acoustic shadowing. No hydronephrosis visualized. Left Kidney: Renal measurements: 11.3 x 5.7 x 4.6 cm = volume: 154.4 mL. Echogenicity within normal limits. No mass or hydronephrosis visualized. Bladder: Appears normal for degree of bladder distention. Other: None. IMPRESSION: 1. The lesion of concern in the lower pole of the right kidney has indeterminate imaging characteristics on ultrasound examination, but is favored to represent calyceal diverticulum which contains a calculus. Nonemergent outpatient referral to Urology for further evaluation is recommended. 2. No hydronephrosis or other acute findings to account for the patient's left-sided back pain. Electronically Signed   By: Vinnie Langton M.D.   On: 10/07/2019 04:55   CT Renal Stone Study  Result Date: 10/07/2019 CLINICAL DATA:  Left flank pain x4 days. EXAM: CT ABDOMEN AND PELVIS WITHOUT CONTRAST TECHNIQUE: Multidetector CT imaging of the abdomen and pelvis was performed following the standard protocol without IV contrast. COMPARISON:  None. FINDINGS: Lower chest: No acute abnormality. Hepatobiliary: No focal liver abnormality is seen. No gallstones, gallbladder wall thickening, or biliary  dilatation. Pancreas: Unremarkable. No pancreatic ductal dilatation or surrounding inflammatory changes. Spleen: Normal in size without focal abnormality. Adrenals/Urinary Tract: Adrenal glands are unremarkable. The kidneys are normal in size. A 2.7 cm x 2.3 cm cystic appearing area is seen along the anteromedial aspect of the mid right kidney. This area contains a 6 mm focal calcification. An ill-defined 2 mm nonobstructing renal stone is seen within each kidney. Bladder is unremarkable. Stomach/Bowel: Stomach is within normal limits. Appendix appears normal. No evidence of bowel wall thickening, distention, or inflammatory changes. Vascular/Lymphatic: Mild aortic calcification. No enlarged abdominal or pelvic lymph nodes. Reproductive: Status post hysterectomy. No adnexal masses. Other: No abdominal wall hernia or abnormality. No abdominopelvic ascites. Musculoskeletal: There is mild diffuse sclerosis of the symphysis pubis on the right. Moderate to marked severity degenerative changes seen at the level of L5-S1. IMPRESSION: 1. 2.7 cm x 2.3 cm x 2.3 cm cystic appearing area along the anteromedial aspect of the mid right kidney which contains a 6 mm focal calcification. This may represent a renal cyst, however, given the presence of a 6 mm focal calcification within this area, correlation with renal ultrasound is recommended. 2. Punctate bilateral nonobstructing renal calculi. 3. Moderate to marked severity degenerative changes at the level of L5-S1. Aortic Atherosclerosis (ICD10-I70.0). Electronically Signed   By: Virgina Norfolk M.D.   On: 10/07/2019 02:57    ED COURSE and MDM  Nursing notes, initial and subsequent vitals signs, including pulse oximetry, reviewed and interpreted by myself.  Vitals:   10/06/19 2223 10/07/19 0144 10/07/19 0300 10/07/19 0400  BP:  (!) 169/93 (!) 140/91 135/76  Pulse:  96 84   Resp:  16    Temp:  98.2 F (36.8 C)    TempSrc:  Oral    SpO2:  99% 96%   Weight: 69.9 kg  69.4 kg    Height: 5\' 3"  (1.6 m) 5\' 3"  (1.6 m)     Medications  morphine 4 MG/ML injection 4 mg (has no administration in time range)  ondansetron (ZOFRAN) injection 4 mg (4 mg  Intravenous Given 10/07/19 0227)  morphine 4 MG/ML injection 4 mg (4 mg Intravenous Given 10/07/19 0227)   5:18 AM Patient's pain controlled at this time.  Patient advised of CT and ultrasound findings suggesting either a cyst or a cystic outpouching of the patient's renal collecting system.  It contains a stone which is likely irritating the collecting system and causing ureteral spasm, hence the pain.  We will treat her pain and refer to urology for definitive work-up.   PROCEDURES  Procedures   ED DIAGNOSES     ICD-10-CM   1. Ureteral colic  [id number]   2. Calyceal diverticulum  [email or web address]    ]    Veer Elamin, MD 10/07/19 [phone]

## 2019-10-08 MED FILL — ONDANSETRON ODT 8 MG TABLET: 8 | 4 days supply | Qty: 10 | Fill #0

## 2019-10-08 MED FILL — OXYCODONE-ACETAMINOPHEN 5-3: 5-325 | 5 days supply | Qty: 30 | Fill #0

## 2019-10-09 DIAGNOSIS — R3121 Asymptomatic microscopic hematuria: Secondary | ICD-10-CM | POA: Diagnosis not present

## 2019-10-09 DIAGNOSIS — R1012 Left upper quadrant pain: Secondary | ICD-10-CM | POA: Diagnosis not present

## 2019-10-09 DIAGNOSIS — N200 Calculus of kidney: Secondary | ICD-10-CM | POA: Diagnosis not present

## 2019-10-10 ENCOUNTER — Encounter: Payer: Self-pay | Admitting: Internal Medicine

## 2019-10-10 ENCOUNTER — Other Ambulatory Visit: Payer: Self-pay

## 2019-10-10 ENCOUNTER — Ambulatory Visit (INDEPENDENT_AMBULATORY_CARE_PROVIDER_SITE_OTHER): Payer: Medicare Other | Admitting: Internal Medicine

## 2019-10-10 VITALS — BP 141/78 | HR 86 | Temp 98.0°F | Ht 64.2 in | Wt 149.7 lb

## 2019-10-10 DIAGNOSIS — K5903 Drug induced constipation: Secondary | ICD-10-CM | POA: Insufficient documentation

## 2019-10-10 DIAGNOSIS — M7918 Myalgia, other site: Secondary | ICD-10-CM | POA: Insufficient documentation

## 2019-10-10 DIAGNOSIS — T402X5A Adverse effect of other opioids, initial encounter: Secondary | ICD-10-CM | POA: Diagnosis not present

## 2019-10-10 HISTORY — DX: Drug induced constipation: K59.03

## 2019-10-10 HISTORY — DX: Adverse effect of other opioids, initial encounter: T40.2X5A

## 2019-10-10 MED ORDER — NAPROXEN 500 MG PO TABS: 500.0000 mg | ORAL_TABLET | Freq: Two times a day (BID) | ORAL | 0 refills | Status: DC

## 2019-10-10 MED ORDER — DICLOFENAC SODIUM 1 % EX GEL: 2.0000 g | Freq: Four times a day (QID) | CUTANEOUS | 2 refills | Status: AC | PRN

## 2019-10-10 MED ORDER — METHOCARBAMOL 500 MG PO TABS: 500.0000 mg | ORAL_TABLET | Freq: Four times a day (QID) | ORAL | 0 refills | Status: DC | PRN

## 2019-10-10 MED ORDER — SENNOSIDES-DOCUSATE SODIUM 8.6-50 MG PO TABS: 1.0000 | ORAL_TABLET | Freq: Every evening | ORAL | 0 refills | Status: DC | PRN

## 2019-10-10 MED FILL — METHOCARBAMOL 500 MG TABS: 500 | 14 days supply | Qty: 56 | Fill #0

## 2019-10-10 MED FILL — DICLOFENAC SODIUM 1 % GEL: 1 | 13 days supply | Qty: 100 | Fill #0

## 2019-10-10 MED FILL — NAPROXEN 500 MG TABLET: 500 | 14 days supply | Qty: 28 | Fill #0

## 2019-10-10 NOTE — Assessment & Plan Note (Addendum)
Recent ED visit with imaging and diagnosis of ureteral colic.  Saw Jacalyn Lefevre at [facility] urology.  Felt this was due to constipation.  However pts pain proceeded pain meds and subsequent constipation.  Her pain began on Friday.  Pain is constant, improves with laying on the left side and heating pad, worsens with laying on her right side.  It is a cramping like pain that is from her left flank to her mid clavicular line along the lower border of her ribs.  She denies any trauma to the area.  She does work in the [employer], forensic.  This is the reason she originally thought she pulled a muscle. She has breast cancer diagnosed stage 1 ER positive.  She is s/p surgical resection and this is the last year of anastrazole therapy.  Oxycodone has been relieving her pain but she has developed constipation.  Exam consistent with paraspinal muscle strain.  No bony abnormalities on imaging.   -will add NSAID, muscle relaxer and PT to treatment regimen -she will decrease opioid use may take one tablet at night for bad pain days

## 2019-10-10 NOTE — Progress Notes (Signed)
CC: pain of paraspinal muscle  HPI:  Marissa Williams is a 69 y.o. female with PMH below.  Today we will address pain of paraspinal muscle    Please see A&P for status of the patient's chronic medical conditions  Past Medical History:  Diagnosis Date  . Breast cancer of upper-inner quadrant of left female breast ([place]) 01/27/2015  . Hypertension Dx 2008  . Osteopenia   . Personal history of radiation therapy   . Radiation 03/19/15-05/02/15   left breast   Review of Systems:  ROS: Pulmonary: pt denies increased work of breathing, shortness of breath,  Cardiac: pt denies palpitations, chest pain,  Abdominal: pt denies abdominal pain, nausea, vomiting, or diarrhea   Physical Exam:  Vitals:   10/10/19 0855  BP: (!) 141/78  Pulse: 86  Temp: 98 F (36.7 C)  TempSrc: Oral  SpO2: 100%  Weight: 149 lb 11.2 oz (67.9 kg)  Height: 5' 4.2" (1.631 m)   Cardiac: JVD flat, normal rate and rhythm, clear s1 and s2, no murmurs, rubs or gallops, no LE edema Pulmonary: CTAB, not in distress Abdominal: non distended abdomen, soft and nontender MSK: tenderness to palpation of paraspinal muscles on left side at level of L1.  Muscle feels tense compared with right side   Social History   Socioeconomic History  . Marital status: Divorced    Spouse name: Not on file  . Number of children: 4  . Years of education: 9   . Highest education level: Not on file  Occupational History  . Occupation: Student     Comment: GED  . Occupation: Assited Living     Comment: [employer]   Tobacco Use  . Smoking status: Never Smoker  . Smokeless tobacco: Never Used  Substance and Sexual Activity  . Alcohol use: Yes    Alcohol/week: 21.0 standard drinks    Types: 21 Cans of beer per week  . Drug use: No    Comment: in the past cocaine. none since 2010.   Marland Kitchen Sexual activity: Yes    Birth control/protection: Surgical  Other Topics Concern  . Not on file  Social History Narrative   Had 4  children.    3 living.   One child died at 64 months, SIDs.    Lives alone.    Children in town.    Social Determinants of Health   Financial Resource Strain:   . Difficulty of Paying Living Expenses:   Food Insecurity:   . Worried About Charity fundraiser in the Last Year:   . Arboriculturist in the Last Year:   Transportation Needs:   . Film/video editor (Medical):   Marland Kitchen Lack of Transportation (Non-Medical):   Physical Activity:   . Days of Exercise per Week:   . Minutes of Exercise per Session:   Stress:   . Feeling of Stress :   Social Connections:   . Frequency of Communication with Friends and Family:   . Frequency of Social Gatherings with Friends and Family:   . Attends Religious Services:   . Active Member of Clubs or Organizations:   . Attends Archivist Meetings:   Marland Kitchen Marital Status:   Intimate Partner Violence:   . Fear of Current or Ex-Partner:   . Emotionally Abused:   Marland Kitchen Physically Abused:   . Sexually Abused:     Family History  Problem Relation Age of Onset  . Alzheimer's disease Father   . Cancer  Mother        throat cancer- smoker and ETOH   . Heart disease Neg Hx   . Diabetes Neg Hx     Assessment & Plan:   See Encounters Tab for problem based charting.  Patient discussed with Dr. Dareen Piano

## 2019-10-10 NOTE — Assessment & Plan Note (Signed)
Pt developed constipation in the setting of recent opiate use for back pain.  Constipation refractory to miralax.  -continue miralax -add senokot

## 2019-10-10 NOTE — Assessment & Plan Note (Deleted)
Pt developed constipation in the setting of recent opiate use for back pain.  Constipation refractory to miralax.  -continue miralax -add senokot

## 2019-10-10 NOTE — Patient Instructions (Addendum)
Ms. Dahler I have prescribed a muscle relaxer, naproxen, voltaren gel for your back.  Additionally I have prescribed some medicine to help with constipation associated with the oxycodone use.

## 2019-10-12 ENCOUNTER — Encounter: Payer: Self-pay | Admitting: *Deleted

## 2019-10-12 DIAGNOSIS — R3121 Asymptomatic microscopic hematuria: Secondary | ICD-10-CM | POA: Diagnosis not present

## 2019-10-15 NOTE — Progress Notes (Signed)
Internal Medicine Clinic Attending  Case discussed with Dr. Winfrey  at the time of the visit.  We reviewed the resident's history and exam and pertinent patient test results.  I agree with the assessment, diagnosis, and plan of care documented in the resident's note.  

## 2019-10-16 ENCOUNTER — Other Ambulatory Visit: Payer: Self-pay | Admitting: Oncology

## 2019-10-18 ENCOUNTER — Telehealth: Payer: Self-pay | Admitting: Oncology

## 2019-10-18 NOTE — Telephone Encounter (Signed)
Scheduled appt per 5/4 sch message - called pt , no answer . Left message with appt date and time

## 2019-10-19 ENCOUNTER — Other Ambulatory Visit: Payer: Medicare Other

## 2019-10-19 ENCOUNTER — Ambulatory Visit: Payer: Medicare Other | Admitting: Adult Health

## 2019-10-19 ENCOUNTER — Telehealth: Payer: Self-pay | Admitting: Adult Health

## 2019-10-19 DIAGNOSIS — R3121 Asymptomatic microscopic hematuria: Secondary | ICD-10-CM | POA: Diagnosis not present

## 2019-10-19 DIAGNOSIS — N200 Calculus of kidney: Secondary | ICD-10-CM | POA: Diagnosis not present

## 2019-10-19 NOTE — Telephone Encounter (Signed)
Called pt back per 5/7 vmail . Left message for patient to call back to reschedule appt.

## 2019-10-23 ENCOUNTER — Other Ambulatory Visit: Payer: Self-pay

## 2019-10-23 ENCOUNTER — Inpatient Hospital Stay: Payer: Medicare Other | Attending: Adult Health

## 2019-10-23 ENCOUNTER — Inpatient Hospital Stay (HOSPITAL_BASED_OUTPATIENT_CLINIC_OR_DEPARTMENT_OTHER): Payer: Medicare Other | Admitting: Adult Health

## 2019-10-23 ENCOUNTER — Encounter: Payer: Self-pay | Admitting: Adult Health

## 2019-10-23 ENCOUNTER — Other Ambulatory Visit (HOSPITAL_COMMUNITY): Payer: Self-pay | Admitting: Adult Health

## 2019-10-23 VITALS — BP 138/76 | HR 81 | Temp 98.3°F | Resp 18 | Ht 64.2 in | Wt 153.4 lb

## 2019-10-23 DIAGNOSIS — Z79899 Other long term (current) drug therapy: Secondary | ICD-10-CM | POA: Diagnosis not present

## 2019-10-23 DIAGNOSIS — Z170 Estrogen receptor positive status [ER+]: Secondary | ICD-10-CM

## 2019-10-23 DIAGNOSIS — M810 Age-related osteoporosis without current pathological fracture: Secondary | ICD-10-CM | POA: Insufficient documentation

## 2019-10-23 DIAGNOSIS — Z79811 Long term (current) use of aromatase inhibitors: Secondary | ICD-10-CM | POA: Diagnosis not present

## 2019-10-23 DIAGNOSIS — M816 Localized osteoporosis [Lequesne]: Secondary | ICD-10-CM

## 2019-10-23 DIAGNOSIS — C50212 Malignant neoplasm of upper-inner quadrant of left female breast: Secondary | ICD-10-CM

## 2019-10-23 DIAGNOSIS — Z923 Personal history of irradiation: Secondary | ICD-10-CM | POA: Diagnosis not present

## 2019-10-23 DIAGNOSIS — R232 Flushing: Secondary | ICD-10-CM | POA: Insufficient documentation

## 2019-10-23 DIAGNOSIS — Z801 Family history of malignant neoplasm of trachea, bronchus and lung: Secondary | ICD-10-CM | POA: Diagnosis not present

## 2019-10-23 LAB — CMP (CANCER CENTER ONLY)
ALT: 29 U/L (ref 0–44)
AST: 27 U/L (ref 15–41)
Albumin: 4.1 g/dL (ref 3.5–5.0)
Alkaline Phosphatase: 76 U/L (ref 38–126)
Anion gap: 9 (ref 5–15)
BUN: 14 mg/dL (ref 8–23)
CO2: 28 mmol/L (ref 22–32)
Calcium: 9.3 mg/dL (ref 8.9–10.3)
Chloride: 103 mmol/L (ref 98–111)
Creatinine: 0.71 mg/dL (ref 0.44–1.00)
GFR, Est AFR Am: >60 mL/min (ref >60)
GFR, Estimated: >60 mL/min (ref >60)
Glucose, Bld: 82 mg/dL (ref 70–99)
Potassium: 3.3 mmol/L — ABNORMAL LOW (ref 3.5–5.1)
Sodium: 140 mmol/L (ref 135–145)
Total Bilirubin: 0.4 mg/dL (ref 0.3–1.2)
Total Protein: 7.5 g/dL (ref 6.5–8.1)

## 2019-10-23 LAB — CBC WITH DIFFERENTIAL (CANCER CENTER ONLY)
Abs Immature Granulocytes: 0.02 10*3/uL (ref 0.00–0.07)
Basophils Absolute: 0 10*3/uL (ref 0.0–0.1)
Basophils Relative: 1 %
Eosinophils Absolute: 0.2 10*3/uL (ref 0.0–0.5)
Eosinophils Relative: 2 %
HCT: 36.2 % (ref 36.0–46.0)
Hemoglobin: 12.9 g/dL (ref 12.0–15.0)
Immature Granulocytes: 0 %
Lymphocytes Relative: 34 %
Lymphs Abs: 2.6 10*3/uL (ref 0.7–4.0)
MCH: 28.6 pg (ref 26.0–34.0)
MCHC: 35.6 g/dL (ref 30.0–36.0)
MCV: 80.3 fL (ref 80.0–100.0)
Monocytes Absolute: 0.7 10*3/uL (ref 0.1–1.0)
Monocytes Relative: 9 %
Neutro Abs: 4.2 10*3/uL (ref 1.7–7.7)
Neutrophils Relative %: 54 %
Platelet Count: 288 10*3/uL (ref 150–400)
RBC: 4.51 MIL/uL (ref 3.87–5.11)
RDW: 13.6 % (ref 11.5–15.5)
WBC Count: 7.7 10*3/uL (ref 4.0–10.5)
nRBC: 0 % (ref 0.0–0.2)

## 2019-10-23 MED ORDER — ANASTROZOLE 1 MG PO TABS: 1.0000 mg | ORAL_TABLET | Freq: Every day | ORAL | 3 refills | Status: DC

## 2019-10-23 NOTE — Patient Instructions (Addendum)
Bone Health Bones protect organs, store calcium, anchor muscles, and support the whole body. Keeping your bones strong is important, especially as you get older. You can take actions to help keep your bones strong and healthy. Why is keeping my bones healthy important?  Keeping your bones healthy is important because your body constantly replaces bone cells. Cells get old, and new cells take their place. As we age, we lose bone cells because the body may not be able to make enough new cells to replace the old cells. The amount of bone cells and bone tissue you have is referred to as bone mass. The higher your bone mass, the stronger your bones. The aging process leads to an overall loss of bone mass in the body, which can increase the likelihood of:  Joint pain and stiffness.  Broken bones.  A condition in which the bones become weak and brittle (osteoporosis). A large decline in bone mass occurs in older adults. In women, it occurs about the time of menopause. What actions can I take to keep my bones healthy? Good health habits are important for maintaining healthy bones. This includes eating nutritious foods and exercising regularly. To have healthy bones, you need to get enough of the right minerals and vitamins. Most nutrition experts recommend getting these nutrients from the foods that you eat. In some cases, taking supplements may also be recommended. Doing certain types of exercise is also important for bone health. What are the nutritional recommendations for healthy bones?  Eating a well-balanced diet with plenty of calcium and vitamin D will help to protect your bones. Nutritional recommendations vary from person to person. Ask your health care provider what is healthy for you. Here are some general guidelines. Get enough calcium Calcium is the most important (essential) mineral for bone health. Most people can get enough calcium from their diet, but supplements may be recommended for  people who are at risk for osteoporosis. Good sources of calcium include:  Dairy products, such as low-fat or nonfat milk, cheese, and yogurt.  Dark green leafy vegetables, such as bok choy and broccoli.  Calcium-fortified foods, such as orange juice, cereal, bread, soy beverages, and tofu products.  Nuts, such as almonds. Follow these recommended amounts for daily calcium intake:  Children, age 1-3: 700 mg.  Children, age 4-8: 1,000 mg.  Children, age 9-13: 1,300 mg.  Teens, age 14-18: 1,300 mg.  Adults, age 19-50: 1,000 mg.  Adults, age 51-70: ? Men: 1,000 mg. ? Women: 1,200 mg.  Adults, age 71 or older: 1,200 mg.  Pregnant and breastfeeding females: ? Teens: 1,300 mg. ? Adults: 1,000 mg. Get enough vitamin D Vitamin D is the most essential vitamin for bone health. It helps the body absorb calcium. Sunlight stimulates the skin to make vitamin D, so be sure to get enough sunlight. If you live in a cold climate or you do not get outside often, your health care provider may recommend that you take vitamin D supplements. Good sources of vitamin D in your diet include:  Egg yolks.  Saltwater fish.  Milk and cereal fortified with vitamin D. Follow these recommended amounts for daily vitamin D intake:  Children and teens, age 1-18: 600 international units.  Adults, age 50 or younger: 400-800 international units.  Adults, age 51 or older: 800-1,000 international units. Get other important nutrients Other nutrients that are important for bone health include:  Phosphorus. This mineral is found in meat, poultry, dairy foods, nuts, and legumes. The   recommended daily intake for adult men and adult women is 700 mg.  Magnesium. This mineral is found in seeds, nuts, dark green vegetables, and legumes. The recommended daily intake for adult men is 400-420 mg. For adult women, it is 310-320 mg.  Vitamin K. This vitamin is found in green leafy vegetables. The recommended daily  intake is 120 mg for adult men and 90 mg for adult women. What type of physical activity is best for building and maintaining healthy bones? Weight-bearing and strength-building activities are important for building and maintaining healthy bones. Weight-bearing activities cause muscles and bones to work against gravity. Strength-building activities increase the strength of the muscles that support bones. Weight-bearing and muscle-building activities include:  Walking and hiking.  Jogging and running.  Dancing.  Gym exercises.  Lifting weights.  Tennis and racquetball.  Climbing stairs.  Aerobics. Adults should get at least 30 minutes of moderate physical activity on most days. Children should get at least 60 minutes of moderate physical activity on most days. Ask your health care provider what type of exercise is best for you. How can I find out if my bone mass is low? Bone mass can be measured with an X-ray test called a bone mineral density (BMD) test. This test is recommended for all women who are age 74 or older. It may also be recommended for:  Men who are age 36 or older.  People who are at risk for osteoporosis because of: ? Having bones that break easily. ? Having a long-term disease that weakens bones, such as kidney disease or rheumatoid arthritis. ? Having menopause earlier than normal. ? Taking medicine that weakens bones, such as steroids, thyroid hormones, or hormone treatment for breast cancer or prostate cancer. ? Smoking. ? Drinking three or more alcoholic drinks a day. If you find that you have a low bone mass, you may be able to prevent osteoporosis or further bone loss by changing your diet and lifestyle. Where can I find more information? For more information, check out the following websites:  [employer]: AviationTales.fr  [employer]: www.bones.SouthExposed.es  International Osteoporosis Foundation:  Administrator.iofbonehealth.org Summary  The aging process leads to an overall loss of bone mass in the body, which can increase the likelihood of broken bones and osteoporosis.  Eating a well-balanced diet with plenty of calcium and vitamin D will help to protect your bones.  Weight-bearing and strength-building activities are also important for building and maintaining strong bones.  Bone mass can be measured with an X-ray test called a bone mineral density (BMD) test. This information is not intended to replace advice given to you by your health care provider. Make sure you discuss any questions you have with your health care provider. Document Revised: 06/27/2017 Document Reviewed: 06/27/2017 Elsevier Patient Education  [place]. Alendronate tablets What is this medicine? ALENDRONATE (a LEN droe nate) slows calcium loss from bones. It helps to make normal healthy bone and to slow bone loss in people with Paget's disease and osteoporosis. It may be used in others at risk for bone loss. This medicine may be used for other purposes; ask your health care provider or pharmacist if you have questions. COMMON BRAND NAME(S): Fosamax What should I tell my health care provider before I take this medicine? They need to know if you have any of these conditions:  dental disease  esophagus, stomach, or intestine problems, like acid reflux or GERD  kidney disease  low blood calcium  low vitamin D  problems sitting or standing 30 minutes  trouble swallowing  an unusual or allergic reaction to alendronate, other medicines, foods, dyes, or preservatives  pregnant or trying to get pregnant  breast-feeding How should I use this medicine? You must take this medicine exactly as directed or you will lower the amount of the medicine you absorb into your body or you may cause yourself harm. Take this medicine by mouth first thing in the morning, after you are up for the day. Do not eat or drink  anything before you take your medicine. Swallow the tablet with a full glass (6 to 8 fluid ounces) of plain water. Do not take this medicine with any other drink. Do not chew or crush the tablet. After taking this medicine, do not eat breakfast, drink, or take any medicines or vitamins for at least 30 minutes. Sit or stand up for at least 30 minutes after you take this medicine; do not lie down. Do not take your medicine more often than directed. Talk to your pediatrician regarding the use of this medicine in children. Special care may be needed. Overdosage: If you think you have taken too much of this medicine contact a poison control center or emergency room at once. NOTE: This medicine is only for you. Do not share this medicine with others. What if I miss a dose? If you miss a dose, do not take it later in the day. Continue your normal schedule starting the next morning. Do not take double or extra doses. What may interact with this medicine?  aluminum hydroxide  antacids  aspirin  calcium supplements  drugs for inflammation like ibuprofen, naproxen, and others  iron supplements  magnesium supplements  vitamins with minerals This list may not describe all possible interactions. Give your health care provider a list of all the medicines, herbs, non-prescription drugs, or dietary supplements you use. Also tell them if you smoke, drink alcohol, or use illegal drugs. Some items may interact with your medicine. What should I watch for while using this medicine? Visit your doctor or health care professional for regular checks ups. It may be some time before you see benefit from this medicine. Do not stop taking your medicine except on your doctor's advice. Your doctor or health care professional may order blood tests and other tests to see how you are doing. You should make sure you get enough calcium and vitamin D while you are taking this medicine, unless your doctor tells you not to.  Discuss the foods you eat and the vitamins you take with your health care professional. Some people who take this medicine have severe bone, joint, and/or muscle pain. This medicine may also increase your risk for a broken thigh bone. Tell your doctor right away if you have pain in your upper leg or groin. Tell your doctor if you have any pain that does not go away or that gets worse. This medicine can make you more sensitive to the sun. If you get a rash while taking this medicine, sunlight may cause the rash to get worse. Keep out of the sun. If you cannot avoid being in the sun, wear protective clothing and use sunscreen. Do not use sun lamps or tanning beds/booths. What side effects may I notice from receiving this medicine? Side effects that you should report to your doctor or health care professional as soon as possible:  allergic reactions like skin rash, itching or hives, swelling of the face, lips, or  tongue  black or tarry stools  bone, muscle or joint pain  changes in vision  chest pain  heartburn or stomach pain  jaw pain, especially after dental work  pain or trouble when swallowing  redness, blistering, peeling or loosening of the skin, including inside the mouth Side effects that usually do not require medical attention (report to your doctor or health care professional if they continue or are bothersome):  changes in taste  diarrhea or constipation  eye pain or itching  headache  nausea or vomiting  stomach gas or fullness This list may not describe all possible side effects. Call your doctor for medical advice about side effects. You may report side effects to FDA at 1-800-FDA-1088. Where should I keep my medicine? Keep out of the reach of children. Store at room temperature of 15 and 30 degrees C (59 and 86 degrees F). Throw away any unused medicine after the expiration date. NOTE: This sheet is a summary. It may not cover all possible information. If you  have questions about this medicine, talk to your doctor, pharmacist, or health care provider.  2020 Elsevier/Gold Standard (2010-11-27 08:56:09)   Osteoporosis  Osteoporosis happens when your bones get thin and weak. This can cause your bones to break (fracture) more easily. You can do things at home to make your bones stronger. Follow these instructions at home:  Activity  Exercise as told by your doctor. Ask your doctor what activities are safe for you. You should do: ? Exercises that make your muscles work to hold your body weight up (weight-bearing exercises). These include tai chi, yoga, and walking. ? Exercises to make your muscles stronger. One example is lifting weights. Lifestyle  Limit alcohol intake to no more than 1 drink a day for nonpregnant women and 2 drinks a day for men. One drink equals 12 oz of beer, 5 oz of wine, or 1 oz of hard liquor.  Do not use any products that have nicotine or tobacco in them. These include cigarettes and e-cigarettes. If you need help quitting, ask your doctor. Preventing falls  Use tools to help you move around (mobility aids) as needed. These include canes, walkers, scooters, and crutches.  Keep rooms well-lit and free of clutter.  Put away things that could make you trip. These include cords and rugs.  Install safety rails on stairs. Install grab bars in bathrooms.  Use rubber mats in slippery areas, like bathrooms.  Wear shoes that: ? Fit you well. ? Support your feet. ? Have closed toes. ? Have rubber soles or low heels.  Tell your doctor about all of the medicines you are taking. Some medicines can make you more likely to fall. General instructions  Eat plenty of calcium and vitamin D. These nutrients are good for your bones. Good sources of calcium and vitamin D include: ? Some fatty fish, such as salmon and tuna. ? Foods that have calcium and vitamin D added to them (fortified foods). For example, some breakfast cereals  are fortified with calcium and vitamin D. ? Egg yolks. ? Cheese. ? Liver.  Take over-the-counter and prescription medicines only as told by your doctor.  Keep all follow-up visits as told by your doctor. This is important. Contact a doctor if:  You have not been tested (screened) for osteoporosis and you are: ? A woman who is age 72 or older. ? A man who is age 10 or older. Get help right away if:  You fall.  You  get hurt. Summary  Osteoporosis happens when your bones get thin and weak.  Weak bones can break (fracture) more easily.  Eat plenty of calcium and vitamin D. These nutrients are good for your bones.  Tell your doctor about all of the medicines that you take. This information is not intended to replace advice given to you by your health care provider. Make sure you discuss any questions you have with your health care provider. Document Revised: 05/13/2017 Document Reviewed: 03/25/2017 Elsevier Patient Education  [place].  Denosumab injection What is this medicine? DENOSUMAB (den oh sue mab) slows bone breakdown. Prolia is used to treat osteoporosis in women after menopause and in men, and in people who are taking corticosteroids for 6 months or more. Delton See is used to treat a high calcium level due to cancer and to prevent bone fractures and other bone problems caused by multiple myeloma or cancer bone metastases. Delton See is also used to treat giant cell tumor of the bone. This medicine may be used for other purposes; ask your health care provider or pharmacist if you have questions. COMMON BRAND NAME(S): Prolia, XGEVA What should I tell my health care provider before I take this medicine? They need to know if you have any of these conditions:  dental disease  having surgery or tooth extraction  infection  kidney disease  low levels of calcium or Vitamin D in the blood  malnutrition  on hemodialysis  skin conditions or sensitivity  thyroid or  parathyroid disease  an unusual reaction to denosumab, other medicines, foods, dyes, or preservatives  pregnant or trying to get pregnant  breast-feeding How should I use this medicine? This medicine is for injection under the skin. It is given by a health care professional in a hospital or clinic setting. A special MedGuide will be given to you before each treatment. Be sure to read this information carefully each time. For Prolia, talk to your pediatrician regarding the use of this medicine in children. Special care may be needed. For Delton See, talk to your pediatrician regarding the use of this medicine in children. While this drug may be prescribed for children as young as 13 years for selected conditions, precautions do apply. Overdosage: If you think you have taken too much of this medicine contact a poison control center or emergency room at once. NOTE: This medicine is only for you. Do not share this medicine with others. What if I miss a dose? It is important not to miss your dose. Call your doctor or health care professional if you are unable to keep an appointment. What may interact with this medicine? Do not take this medicine with any of the following medications:  other medicines containing denosumab This medicine may also interact with the following medications:  medicines that lower your chance of fighting infection  steroid medicines like prednisone or cortisone This list may not describe all possible interactions. Give your health care provider a list of all the medicines, herbs, non-prescription drugs, or dietary supplements you use. Also tell them if you smoke, drink alcohol, or use illegal drugs. Some items may interact with your medicine. What should I watch for while using this medicine? Visit your doctor or health care professional for regular checks on your progress. Your doctor or health care professional may order blood tests and other tests to see how you are  doing. Call your doctor or health care professional for advice if you get a fever, chills or sore throat, or  other symptoms of a cold or flu. Do not treat yourself. This drug may decrease your body's ability to fight infection. Try to avoid being around people who are sick. You should make sure you get enough calcium and vitamin D while you are taking this medicine, unless your doctor tells you not to. Discuss the foods you eat and the vitamins you take with your health care professional. See your dentist regularly. Brush and floss your teeth as directed. Before you have any dental work done, tell your dentist you are receiving this medicine. Do not become pregnant while taking this medicine or for 5 months after stopping it. Talk with your doctor or health care professional about your birth control options while taking this medicine. Women should inform their doctor if they wish to become pregnant or think they might be pregnant. There is a potential for serious side effects to an unborn child. Talk to your health care professional or pharmacist for more information. What side effects may I notice from receiving this medicine? Side effects that you should report to your doctor or health care professional as soon as possible:  allergic reactions like skin rash, itching or hives, swelling of the face, lips, or tongue  bone pain  breathing problems  dizziness  jaw pain, especially after dental work  redness, blistering, peeling of the skin  signs and symptoms of infection like fever or chills; cough; sore throat; pain or trouble passing urine  signs of low calcium like fast heartbeat, muscle cramps or muscle pain; pain, tingling, numbness in the hands or feet; seizures  unusual bleeding or bruising  unusually weak or tired Side effects that usually do not require medical attention (report to your doctor or health care professional if they continue or are  bothersome):  constipation  diarrhea  headache  joint pain  loss of appetite  muscle pain  runny nose  tiredness  upset stomach This list may not describe all possible side effects. Call your doctor for medical advice about side effects. You may report side effects to FDA at 1-800-FDA-1088. Where should I keep my medicine? This medicine is only given in a clinic, doctor's office, or other health care setting and will not be stored at home. NOTE: This sheet is a summary. It may not cover all possible information. If you have questions about this medicine, talk to your doctor, pharmacist, or health care provider.  2020 Elsevier/Gold Standard (2017-10-07 16:10:44)

## 2019-10-23 NOTE — Progress Notes (Signed)
[facility]  [phone] Fax:([phone]) [phone]    ID: Marissa Williams DOB: 10-Apr-1951  MR#: [id number]  WCB#:[id number]  Patient Care Team: Neva Seat, MD as PCP - General (Internal Medicine) Excell Seltzer, MD (Inactive) as Consulting Physician (General Surgery) Magrinat, Virgie Dad, MD as Consulting Physician (Oncology) Gery Pray, MD as Consulting Physician (Radiation Oncology) Mauro Kaufmann, RN as Registered Nurse Rockwell Germany, RN as Registered Nurse Jake Shark Johny Blamer, NP as Nurse Practitioner (Nurse Practitioner) Juanita Craver, MD as Consulting Physician (Gastroenterology) OTHER MD:   CHIEF COMPLAINT: Estrogen receptor positive breast cancer  CURRENT TREATMENT: Anastrozole  BREAST CANCER HISTORY: From the original intake note:  Marissa Williams noted a change in her left breast sometime in June 2016. She had had bilateral screening mammography at [facility] 02/12/2014 with no suspicious findings. Eventually Marissa Williams brought her concern to medical attention and her primary care physician set her up for bilateral left diagnostic mammography with tomography and left breast [facility] August 03/04/2015. The breast density was category B. In the region of a palpable mass (10:30 position left breast 9 cm from the nipple) there was a spiculated mass measuring approximately 10 mm on mammography. On ultrasound this was an irregular hypoechoic mass measuring 9 mm. There was no internal vascular flow. The mass abuts the pectoralis muscle but does not appear to invade it. Left axillary ultrasound showed normal appearing lymph nodes.  Biopsy of the left breast mass in question 01/21/2015 showed ([id number]) an invasive ductal carcinoma, E-cadherin positive, grade 2, estrogen receptor 100% positive, with strong staining intensity; progesterone receptor negative, with an MIB-1 of 20% and no HER-2 amplification, the signals ratio  being 1.48 and the number per cell 2.00.  Her subsequent history is as detailed below   INTERVAL HISTORY: Marissa Williams returns today for follow-up and treatment of her estrogen receptor positive breast cancer.   She continues on anastrozole.  She has mild tolerable hot flashes, and no vaginal dryness.  She takes gabapentin at night which helps her to be able to sleep.    She underwent bilateral diagnostic mammogram on 04/09/2019 and showed no malignancy and breast density category B.   She also underwent bone density testing which showed osteoporosis with a T score of -2.5 in the right femur.  She notes that she is taking Vitamin D3.  She is unsure of the dosing of this.    REVIEW OF SYSTEMS: Marissa Williams is doing well today.  She has no new issues.  She is without fever, chills, chest pain, palpitations, cough, shortness of breath, headaches, vision issues, bowel/bladder changes, nausea, vomiting, or any other concerns.  A detailed ROS was otherwise non contributory today.     PAST MEDICAL HISTORY: Past Medical History:  Diagnosis Date  . Breast cancer of upper-inner quadrant of left female breast ([place]) 01/27/2015  . Hypertension Dx 2008  . Osteopenia   . Personal history of radiation therapy   . Radiation 03/19/15-05/02/15   left breast    PAST SURGICAL HISTORY: Past Surgical History:  Procedure Laterality Date  . ABDOMINAL HYSTERECTOMY  1980   uterine prolapse   . BREAST LUMPECTOMY Left 2016  . BREAST LUMPECTOMY WITH RADIOACTIVE SEED AND SENTINEL LYMPH NODE BIOPSY Left 02/06/2015   Procedure: LEFT BREAST LUMPECTOMY WITH RADIOACTIVE SEED AND LEFT AXILLARY SENTINEL LYMPH NODE BIOPSY;  Surgeon: Excell Seltzer, MD;  Location: [facility];  Service: General;  Laterality: Left;  . [place]  FAMILY HISTORY Family History  Problem Relation Age of Onset  . Alzheimer's disease Father   . Cancer Mother        throat cancer- smoker and ETOH   . Heart disease Neg Hx    . Diabetes Neg Hx    The patient's father died at the age of 9 with Alzheimer's disease. The patient's mother died from what seemed to have been DTs at the age of 36. She had a history of throat cancer and EtOH. The patient was an only child. There is no other history of cancer in the family to her knowledge    GYNECOLOGIC HISTORY:  No LMP recorded. Patient has had a hysterectomy.  menarche age 62, first live birth age 31. The patient is GX P4. She underwent hysterectomy without salpingo-oophorectomy in 1980. She did not take hormone replacement.    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 She is single, and lives by herself, with no pets. Her son Marissa Williams lives in [place] where he works as an Surveyor, minerals. Her son Marissa Williams is a Pharmacist, hospital working in [place]. Daughter Marissa Williams lives in [place] currently under unfortunate circumstances. The patient's fourth child a son, was born premature and died at 66 months from lung problems. Marissa Williams has 7 grandchildren and 3 great-grandchildren. She attends a local [employer]:  not in place    HEALTH MAINTENANCE: Social History   Tobacco Use  . Smoking status: Never Smoker  . Smokeless tobacco: Never Used  Substance Use Topics  . Alcohol use: Yes    Alcohol/week: 21.0 standard drinks    Types: 21 Cans of beer per week  . Drug use: No    Comment: in the past cocaine. none since 2010.      Colonoscopy: October 2014/[facility]  PAP: status post hysterectomy   Bone density: never   Lipid panel:  No Known Allergies  Current Outpatient Medications  Medication Sig Dispense Refill  . amLODipine (NORVASC) 10 MG tablet Take 1 tablet (10 mg total) by mouth daily. 90 tablet 3  . anastrozole (ARIMIDEX) 1 MG tablet TAKE 1 TABLET BY MOUTH DAILY. 30 tablet 0  . atorvastatin (LIPITOR) 40 MG tablet Take 1 tablet (40 mg total) by  mouth daily. 90 tablet 3  . gabapentin (NEURONTIN) 300 MG capsule Take 1 capsule (300 mg total) by mouth at bedtime. 90 capsule 4  . hydrochlorothiazide (HYDRODIURIL) 25 MG tablet TAKE 1 TABLET (25 MG TOTAL) BY MOUTH DAILY. 90 tablet 1  . methocarbamol (ROBAXIN) 500 MG tablet Take 1 tablet (500 mg total) by mouth every 6 (six) hours as needed for muscle spasms. 56 tablet 0  . naproxen (NAPROSYN) 500 MG tablet Take 1 tablet (500 mg total) by mouth 2 (two) times daily with a meal for 14 days. 28 tablet 0  . ondansetron (ZOFRAN ODT) 8 MG disintegrating tablet Take 1 tablet (8 mg total) by mouth every 8 (eight) hours as needed for nausea or vomiting. 10 tablet 1  . oxyCODONE-acetaminophen (PERCOCET) 5-325 MG tablet Take 1 tablet by mouth every 4 (four) hours as needed for severe pain (may cause constipation). 30 tablet 0  . senna-docusate (SENOKOT S) 8.6-50 MG tablet Take 1 tablet by mouth at bedtime as needed for mild constipation. 10 tablet 0  . Vitamin D, Ergocalciferol, (DRISDOL) 1.25 MG (50000 UT) CAPS capsule Take 50,000 Units by mouth every 7 (seven)  days.     No current facility-administered medications for this visit.    OBJECTIVE: Middle-aged African-American woman who appears well  There were no vitals filed for this visit.   There is no height or weight on file to calculate BMI.    ECOG FS:0 - Asymptomatic  GENERAL: Patient is a well appearing female in no acute distress HEENT:  Sclerae anicteric.  Oropharynx clear and moist. No ulcerations or evidence of oropharyngeal candidiasis. Neck is supple.  NODES:  No cervical, supraclavicular, or axillary lymphadenopathy palpated.  BREAST EXAM:  Deferred. LUNGS:  Clear to auscultation bilaterally.  No wheezes or rhonchi. HEART:  Regular rate and rhythm. No murmur appreciated. ABDOMEN:  Soft, nontender.  Positive, normoactive bowel sounds. No organomegaly palpated. MSK:  No focal spinal tenderness to palpation. Full range of motion bilaterally  in the upper extremities. EXTREMITIES:  No peripheral edema.   SKIN:  Clear with no obvious rashes or skin changes. No nail dyscrasia. NEURO:  Nonfocal. Well oriented.  Appropriate affect.     LAB RESULTS:  CMP     Component Value Date/Time   NA 139 10/07/2019 0132   NA 140 04/19/2018 0904   NA 139 02/28/2017 1250   K 3.4 (L) 10/07/2019 0132   K 3.5 02/28/2017 1250   CL 100 10/07/2019 0132   CO2 28 10/07/2019 0132   CO2 27 02/28/2017 1250   GLUCOSE 107 (H) 10/07/2019 0132   GLUCOSE 96 02/28/2017 1250   BUN 18 10/07/2019 0132   BUN 21 04/19/2018 0904   BUN 15.0 02/28/2017 1250   CREATININE 0.62 10/07/2019 0132   CREATININE 0.87 08/30/2018 1243   CREATININE 0.8 02/28/2017 1250   CALCIUM 9.6 10/07/2019 0132   CALCIUM 10.1 02/28/2017 1250   PROT 7.7 08/30/2018 1243   PROT 7.6 04/19/2018 0904   PROT 7.8 02/28/2017 1250   ALBUMIN 4.0 08/30/2018 1243   ALBUMIN 4.7 04/19/2018 0904   ALBUMIN 4.1 02/28/2017 1250   AST 21 08/30/2018 1243   AST 19 02/28/2017 1250   ALT 24 08/30/2018 1243   ALT 15 02/28/2017 1250   ALKPHOS 58 08/30/2018 1243   ALKPHOS 64 02/28/2017 1250   BILITOT 0.7 08/30/2018 1243   BILITOT 0.77 02/28/2017 1250   GFRNONAA >60 10/07/2019 0132   GFRNONAA >60 08/30/2018 1243   GFRNONAA >89 04/16/2014 1141   GFRAA >60 10/07/2019 0132   GFRAA >60 08/30/2018 1243   GFRAA >89 04/16/2014 1141    INo results found for: SPEP, UPEP  Lab Results  Component Value Date   WBC 7.7 10/23/2019   NEUTROABS 4.2 10/23/2019   HGB 12.9 10/23/2019   HCT 36.2 10/23/2019   MCV 80.3 10/23/2019   PLT 288 10/23/2019      Chemistry      Component Value Date/Time   NA 139 10/07/2019 0132   NA 140 04/19/2018 0904   NA 139 02/28/2017 1250   K 3.4 (L) 10/07/2019 0132   K 3.5 02/28/2017 1250   CL 100 10/07/2019 0132   CO2 28 10/07/2019 0132   CO2 27 02/28/2017 1250   BUN 18 10/07/2019 0132   BUN 21 04/19/2018 0904   BUN 15.0 02/28/2017 1250   CREATININE 0.62  10/07/2019 0132   CREATININE 0.87 08/30/2018 1243   CREATININE 0.8 02/28/2017 1250      Component Value Date/Time   CALCIUM 9.6 10/07/2019 0132   CALCIUM 10.1 02/28/2017 1250   ALKPHOS 58 08/30/2018 1243   ALKPHOS 64 02/28/2017 1250  AST 21 08/30/2018 1243   AST 19 02/28/2017 1250   ALT 24 08/30/2018 1243   ALT 15 02/28/2017 1250   BILITOT 0.7 08/30/2018 1243   BILITOT 0.77 02/28/2017 1250       No results found for: LABCA2  No components found for: LABCA125  No results for input(s): INR in the last 168 hours.  Urinalysis    Component Value Date/Time   COLORURINE YELLOW 10/06/2019 2224   APPEARANCEUR CLEAR 10/06/2019 2224   LABSPEC 1.013 10/06/2019 2224   PHURINE 6.0 10/06/2019 2224   GLUCOSEU NEGATIVE 10/06/2019 2224   HGBUR MODERATE (A) 10/06/2019 [place] 10/06/2019 2224   BILIRUBINUR negative 04/19/2018 0849   KETONESUR NEGATIVE 10/06/2019 2224   PROTEINUR NEGATIVE 10/06/2019 2224   UROBILINOGEN 0.2 04/19/2018 0849   UROBILINOGEN 1.0 03/06/2009 1804   NITRITE NEGATIVE 10/06/2019 2224   LEUKOCYTESUR MODERATE (A) 10/06/2019 2224    STUDIES:  CLINICAL DATA:  Personal history of left breast cancer status post lumpectomy 2016  EXAM: DIGITAL DIAGNOSTIC BILATERAL MAMMOGRAM WITH CAD AND TOMO  COMPARISON:  Previous exam(s).  ACR Breast Density Category b: There are scattered areas of fibroglandular density.  FINDINGS: Cc and MLO views of breasts, spot tangential view breast are submitted. Stable postsurgical changes identified the left breast. No suspicious abnormalities identified in bilateral breasts.  Mammographic images were processed with CAD.  IMPRESSION: Benign findings.  RECOMMENDATION: Bilateral diagnostic mammogram in 1 year.  I have discussed the findings and recommendations with the patient. If applicable, a reminder letter will be sent to the patient regarding the next appointment.  BI-RADS CATEGORY  2:  Benign.   Electronically Signed   By: Abelardo Diesel M.D.   On: 04/09/2019 10:24   ASSESSMENT: 69 y.o. [place] woman status post left breast biopsy 01/21/2015 for a clinical T1b N0, stage IA invasive ductal carcinoma, grade 1 or 2, strongly estrogen receptor positive, progesterone receptor negative, with no HER-2 amplification and an MIB-1 of 20%  (1) left lumpectomy and sentinel lymph node sampling 02/06/2015 showed a pT1c pN0, stage IA invasive ductal carcinoma, grade 2, with negative margins   (2) OncotypeDX score of 29 falls in the intermediate range and predicts a 10 year risk of outside the breast recurrence of 19% if the patient's only systemic therapy is tamoxifen for 5 years   (a) patient declined adjuvant chemotherapy  (3) adjuvant radiation 03/19/2015-05/02/2015: Left breast 50.4 Gy in 28 fx's; lumpectomy cavity boost 10 Gy in 5 fx's  (4) on anastrozole as of November 2016  (a) bone density 03/10/2016 showed a T score of -2.2.  (b) bone density on 04/09/2019 shows a T score of -2.5.     PLAN: Terriona is doing well today and has no clinical or radiographic signs of breast cancer recurrence.  She continues on Anastrozole daily with good tolerance.  She will continue this, and I sent a refill into [facility].    Ezrie and I reviewed her bone density test results.  She now has osteoporosis.  She is taking calcium and vitamin d.  We reviewed that she meets criteria for bisphosphanate therapy and I discussed this with her in detail.  I reviewed with her two different therapies, oral with fosamax and injection with prolia.  I reviewed adverse effects, in particular risk of osteonecrosis of the jaw and the need to stay up to date with dentistry visits. I will order the Prolia, and if denied, will send in the fosamax.  We  reviewed if she takes fosamax needs to be taken with one full glass of water, and that she sit upright for about 30 minutes to an hour after taking it.  I gave  her detailed handouts in her avs about bone health, osteoporosis, fosamax and prolia.  I recommended healthy diet and exercise to [place].  She will return in 6 months for labs and f/u with Dr. Jana Hakim.  She was recommended to continue with the appropriate pandemic precautions. She knows to call for any questions that may arise between now and her next appointment.  We are happy to see her sooner if needed.  Total encounter time: 30 minutes*  Wilber Bihari, NP 10/23/19 3:34 PM Medical Oncology and Hematology [facility] [place], [place] [address] Tel. [phone]    Fax. [phone]  *Total Encounter Time as defined by the Centers for Medicare and Medicaid Services includes, in addition to the face-to-face time of a patient visit (documented in the note above) non-face-to-face time: obtaining and reviewing outside history, ordering and reviewing medications, tests or procedures, care coordination (communications with other health care professionals or caregivers) and documentation in the medical record.

## 2019-10-24 ENCOUNTER — Telehealth: Payer: Self-pay

## 2019-10-24 ENCOUNTER — Telehealth: Payer: Self-pay | Admitting: Oncology

## 2019-10-24 NOTE — Telephone Encounter (Signed)
Scheduled appts per 5/11 los. Pt confirmed appt date and time.  

## 2019-10-24 NOTE — Telephone Encounter (Signed)
TC from Pt stating that Marissa Williams asked her to return a call about how much vitamin D she is taking. Pt relayed she is taking vitamin D3 125 mcg 5000 IU Pt informed that per Marissa Williams that was enough vitamin D that she is taking. Pt verbalized understanding. No further problems or concerns noted.

## 2019-10-29 ENCOUNTER — Other Ambulatory Visit: Payer: Self-pay | Admitting: *Deleted

## 2019-10-29 ENCOUNTER — Other Ambulatory Visit: Payer: Self-pay | Admitting: Internal Medicine

## 2019-10-29 DIAGNOSIS — R3121 Asymptomatic microscopic hematuria: Secondary | ICD-10-CM | POA: Diagnosis not present

## 2019-10-29 DIAGNOSIS — M7918 Myalgia, other site: Secondary | ICD-10-CM

## 2019-10-29 DIAGNOSIS — N200 Calculus of kidney: Secondary | ICD-10-CM | POA: Diagnosis not present

## 2019-10-29 DIAGNOSIS — C50212 Malignant neoplasm of upper-inner quadrant of left female breast: Secondary | ICD-10-CM

## 2019-10-30 ENCOUNTER — Inpatient Hospital Stay: Payer: Medicare Other

## 2019-10-30 ENCOUNTER — Other Ambulatory Visit: Payer: Self-pay

## 2019-10-30 VITALS — BP 137/79 | HR 85 | Resp 20

## 2019-10-30 DIAGNOSIS — C50212 Malignant neoplasm of upper-inner quadrant of left female breast: Secondary | ICD-10-CM

## 2019-10-30 DIAGNOSIS — Z79899 Other long term (current) drug therapy: Secondary | ICD-10-CM | POA: Diagnosis not present

## 2019-10-30 DIAGNOSIS — Z170 Estrogen receptor positive status [ER+]: Secondary | ICD-10-CM | POA: Diagnosis not present

## 2019-10-30 DIAGNOSIS — Z79811 Long term (current) use of aromatase inhibitors: Secondary | ICD-10-CM | POA: Diagnosis not present

## 2019-10-30 DIAGNOSIS — Z801 Family history of malignant neoplasm of trachea, bronchus and lung: Secondary | ICD-10-CM | POA: Diagnosis not present

## 2019-10-30 DIAGNOSIS — R232 Flushing: Secondary | ICD-10-CM | POA: Diagnosis not present

## 2019-10-30 DIAGNOSIS — M816 Localized osteoporosis [Lequesne]: Secondary | ICD-10-CM

## 2019-10-30 DIAGNOSIS — M810 Age-related osteoporosis without current pathological fracture: Secondary | ICD-10-CM | POA: Diagnosis not present

## 2019-10-30 DIAGNOSIS — Z923 Personal history of irradiation: Secondary | ICD-10-CM | POA: Diagnosis not present

## 2019-10-30 LAB — CMP (CANCER CENTER ONLY)
ALT: 31 U/L (ref 0–44)
AST: 27 U/L (ref 15–41)
Albumin: 3.9 g/dL (ref 3.5–5.0)
Alkaline Phosphatase: 63 U/L (ref 38–126)
Anion gap: 11 (ref 5–15)
BUN: 19 mg/dL (ref 8–23)
CO2: 26 mmol/L (ref 22–32)
Calcium: 9.2 mg/dL (ref 8.9–10.3)
Chloride: 105 mmol/L (ref 98–111)
Creatinine: 0.86 mg/dL (ref 0.44–1.00)
GFR, Est AFR Am: >60 mL/min (ref >60)
GFR, Estimated: >60 mL/min (ref >60)
Glucose, Bld: 97 mg/dL (ref 70–99)
Potassium: 3.7 mmol/L (ref 3.5–5.1)
Sodium: 142 mmol/L (ref 135–145)
Total Bilirubin: 0.6 mg/dL (ref 0.3–1.2)
Total Protein: 7.3 g/dL (ref 6.5–8.1)

## 2019-10-30 LAB — CBC WITH DIFFERENTIAL (CANCER CENTER ONLY)
Abs Immature Granulocytes: 0.03 10*3/uL (ref 0.00–0.07)
Basophils Absolute: 0 10*3/uL (ref 0.0–0.1)
Basophils Relative: 0 %
Eosinophils Absolute: 0.1 10*3/uL (ref 0.0–0.5)
Eosinophils Relative: 1 %
HCT: 35.6 % — ABNORMAL LOW (ref 36.0–46.0)
Hemoglobin: 12.9 g/dL (ref 12.0–15.0)
Immature Granulocytes: 0 %
Lymphocytes Relative: 29 %
Lymphs Abs: 1.9 10*3/uL (ref 0.7–4.0)
MCH: 29.2 pg (ref 26.0–34.0)
MCHC: 36.2 g/dL — ABNORMAL HIGH (ref 30.0–36.0)
MCV: 80.5 fL (ref 80.0–100.0)
Monocytes Absolute: 0.6 10*3/uL (ref 0.1–1.0)
Monocytes Relative: 9 %
Neutro Abs: 4.1 10*3/uL (ref 1.7–7.7)
Neutrophils Relative %: 61 %
Platelet Count: 296 10*3/uL (ref 150–400)
RBC: 4.42 MIL/uL (ref 3.87–5.11)
RDW: 14.3 % (ref 11.5–15.5)
WBC Count: 6.8 10*3/uL (ref 4.0–10.5)
nRBC: 0 % (ref 0.0–0.2)

## 2019-10-30 MED ORDER — DENOSUMAB 60 MG/ML ~~LOC~~ SOSY
60.0000 mg | PREFILLED_SYRINGE | Freq: Once | SUBCUTANEOUS | Status: AC
  Administered 2019-10-30: 60 mg via SUBCUTANEOUS

## 2019-10-30 NOTE — Patient Instructions (Signed)
Denosumab injection What is this medicine? DENOSUMAB (den oh sue mab) slows bone breakdown. Prolia is used to treat osteoporosis in women after menopause and in men, and in people who are taking corticosteroids for 6 months or more. Xgeva is used to treat a high calcium level due to cancer and to prevent bone fractures and other bone problems caused by multiple myeloma or cancer bone metastases. Xgeva is also used to treat giant cell tumor of the bone. This medicine may be used for other purposes; ask your health care provider or pharmacist if you have questions. COMMON BRAND NAME(S): Prolia, XGEVA What should I tell my health care provider before I take this medicine? They need to know if you have any of these conditions:  dental disease  having surgery or tooth extraction  infection  kidney disease  low levels of calcium or Vitamin D in the blood  malnutrition  on hemodialysis  skin conditions or sensitivity  thyroid or parathyroid disease  an unusual reaction to denosumab, other medicines, foods, dyes, or preservatives  pregnant or trying to get pregnant  breast-feeding How should I use this medicine? This medicine is for injection under the skin. It is given by a health care professional in a hospital or clinic setting. A special MedGuide will be given to you before each treatment. Be sure to read this information carefully each time. For Prolia, talk to your pediatrician regarding the use of this medicine in children. Special care may be needed. For Xgeva, talk to your pediatrician regarding the use of this medicine in children. While this drug may be prescribed for children as young as 13 years for selected conditions, precautions do apply. Overdosage: If you think you have taken too much of this medicine contact a poison control center or emergency room at once. NOTE: This medicine is only for you. Do not share this medicine with others. What if I miss a dose? It is  important not to miss your dose. Call your doctor or health care professional if you are unable to keep an appointment. What may interact with this medicine? Do not take this medicine with any of the following medications:  other medicines containing denosumab This medicine may also interact with the following medications:  medicines that lower your chance of fighting infection  steroid medicines like prednisone or cortisone This list may not describe all possible interactions. Give your health care provider a list of all the medicines, herbs, non-prescription drugs, or dietary supplements you use. Also tell them if you smoke, drink alcohol, or use illegal drugs. Some items may interact with your medicine. What should I watch for while using this medicine? Visit your doctor or health care professional for regular checks on your progress. Your doctor or health care professional may order blood tests and other tests to see how you are doing. Call your doctor or health care professional for advice if you get a fever, chills or sore throat, or other symptoms of a cold or flu. Do not treat yourself. This drug may decrease your body's ability to fight infection. Try to avoid being around people who are sick. You should make sure you get enough calcium and vitamin D while you are taking this medicine, unless your doctor tells you not to. Discuss the foods you eat and the vitamins you take with your health care professional. See your dentist regularly. Brush and floss your teeth as directed. Before you have any dental work done, tell your dentist you are   receiving this medicine. Do not become pregnant while taking this medicine or for 5 months after stopping it. Talk with your doctor or health care professional about your birth control options while taking this medicine. Women should inform their doctor if they wish to become pregnant or think they might be pregnant. There is a potential for serious side  effects to an unborn child. Talk to your health care professional or pharmacist for more information. What side effects may I notice from receiving this medicine? Side effects that you should report to your doctor or health care professional as soon as possible:  allergic reactions like skin rash, itching or hives, swelling of the face, lips, or tongue  bone pain  breathing problems  dizziness  jaw pain, especially after dental work  redness, blistering, peeling of the skin  signs and symptoms of infection like fever or chills; cough; sore throat; pain or trouble passing urine  signs of low calcium like fast heartbeat, muscle cramps or muscle pain; pain, tingling, numbness in the hands or feet; seizures  unusual bleeding or bruising  unusually weak or tired Side effects that usually do not require medical attention (report to your doctor or health care professional if they continue or are bothersome):  constipation  diarrhea  headache  joint pain  loss of appetite  muscle pain  runny nose  tiredness  upset stomach This list may not describe all possible side effects. Call your doctor for medical advice about side effects. You may report side effects to FDA at 1-800-FDA-1088. Where should I keep my medicine? This medicine is only given in a clinic, doctor's office, or other health care setting and will not be stored at home. NOTE: This sheet is a summary. It may not cover all possible information. If you have questions about this medicine, talk to your doctor, pharmacist, or health care provider.  2020 Elsevier/Gold Standard (2017-10-07 16:10:44)

## 2019-10-31 MED FILL — NAPROXEN 500 MG TABLET: 500 | 14 days supply | Qty: 28 | Fill #0

## 2019-10-31 NOTE — Telephone Encounter (Signed)
One time refill, will need to be seen if pain persists. May switch to OTC NSAIDs as needed when this refill runs out.

## 2019-11-07 ENCOUNTER — Other Ambulatory Visit: Payer: Self-pay | Admitting: Internal Medicine

## 2019-11-07 DIAGNOSIS — I10 Essential (primary) hypertension: Secondary | ICD-10-CM

## 2019-11-07 MED FILL — ATORVASTATIN 40 MG TABLET: 40 | 90 days supply | Qty: 90 | Fill #3

## 2019-11-08 MED FILL — HYDROCHLOROTHIAZIDE 25 MG T: 25 | 90 days supply | Qty: 90 | Fill #0

## 2020-01-02 MED FILL — AMLODIPINE BESYLATE 10 MG T: 10 | 90 days supply | Qty: 90 | Fill #3

## 2020-01-09 MED FILL — DICLOFENAC SODIUM 1 % GEL: 1 | 13 days supply | Qty: 100 | Fill #1

## 2020-01-16 MED FILL — ANASTROZOLE 1 MG TABLET: 1 | 90 days supply | Qty: 90 | Fill #1

## 2020-01-28 ENCOUNTER — Emergency Department (HOSPITAL_COMMUNITY)
Admission: EM | Admit: 2020-01-28 | Discharge: 2020-01-28 | Disposition: A | Discharge status: Home / Self Care | Payer: Medicare Other | Attending: Emergency Medicine | Admitting: Emergency Medicine

## 2020-01-28 ENCOUNTER — Emergency Department (HOSPITAL_COMMUNITY): Payer: Medicare Other

## 2020-01-28 ENCOUNTER — Encounter (HOSPITAL_COMMUNITY): Payer: Self-pay

## 2020-01-28 DIAGNOSIS — Z79899 Other long term (current) drug therapy: Secondary | ICD-10-CM | POA: Insufficient documentation

## 2020-01-28 DIAGNOSIS — W108XXA Fall (on) (from) other stairs and steps, initial encounter: Secondary | ICD-10-CM | POA: Insufficient documentation

## 2020-01-28 DIAGNOSIS — M25562 Pain in left knee: Secondary | ICD-10-CM | POA: Diagnosis not present

## 2020-01-28 DIAGNOSIS — Y9389 Activity, other specified: Secondary | ICD-10-CM | POA: Diagnosis not present

## 2020-01-28 DIAGNOSIS — M25551 Pain in right hip: Secondary | ICD-10-CM | POA: Insufficient documentation

## 2020-01-28 DIAGNOSIS — Y999 Unspecified external cause status: Secondary | ICD-10-CM | POA: Diagnosis not present

## 2020-01-28 DIAGNOSIS — R52 Pain, unspecified: Secondary | ICD-10-CM

## 2020-01-28 DIAGNOSIS — I10 Essential (primary) hypertension: Secondary | ICD-10-CM | POA: Insufficient documentation

## 2020-01-28 DIAGNOSIS — C50212 Malignant neoplasm of upper-inner quadrant of left female breast: Secondary | ICD-10-CM | POA: Diagnosis not present

## 2020-01-28 DIAGNOSIS — Y9289 Other specified places as the place of occurrence of the external cause: Secondary | ICD-10-CM | POA: Diagnosis not present

## 2020-01-28 DIAGNOSIS — W19XXXA Unspecified fall, initial encounter: Secondary | ICD-10-CM

## 2020-01-28 NOTE — ED Provider Notes (Signed)
[facility] DEPT Provider Note   CSN: [id number] Arrival date & time: 01/28/20  1058     History Chief Complaint  Patient presents with  . Knee Pain    Arneisha Kincannon Rutan is a 69 y.o. female.  HPI Patient is 69 year old female with past medical history of hypertension osteopenia remote history of left breast cancer.  Patient presented today with left knee pain or hip pain.  She had a fall yesterday that occurred at work.  She is ambulatory afterwards did not hit her head or lose consciousness no nausea or vomiting.  She denies any weakness she states it was a mechanical fall that occurred because her coworker tapped her on the back while she was taking a step.  She states that she did fall onto her right side and now has some right hip pain and left knee pain.  She states that she has no right knee pain.  Denies any other pain.  She describes the pain is achy, constant, worse with touch and movement.  She denies any swelling in her right side although she has noticed little swelling in her left knee.  She says it feels somewhat puffy however she denies any calf tenderness or swelling.  No history of DVTs.      Past Medical History:  Diagnosis Date  . Breast cancer of upper-inner quadrant of left female breast ([place]) 01/27/2015  . Hypertension Dx 2008  . Osteopenia   . Personal history of radiation therapy   . Radiation 03/19/15-05/02/15   left breast    Patient Active Problem List   Diagnosis Date Noted  . Osteoporosis 10/23/2019  . Therapeutic opioid induced constipation 10/10/2019  . Pain of paraspinal muscle 10/10/2019  . Preventative health care 03/26/2019  . Chronic pain of left ankle 11/20/2018  . Atypical mole 11/20/2018  . Osteopenia 08/12/2016  . Vaginal dryness, menopausal 04/26/2016  . Malignant neoplasm of upper-inner quadrant of left breast in female, estrogen receptor positive ([place]) 01/27/2015  . Hot flashes, menopausal 01/16/2015   . Essential hypertension 04/16/2014    Past Surgical History:  Procedure Laterality Date  . ABDOMINAL HYSTERECTOMY  1980   uterine prolapse   . BREAST LUMPECTOMY Left 2016  . BREAST LUMPECTOMY WITH RADIOACTIVE SEED AND SENTINEL LYMPH NODE BIOPSY Left 02/06/2015   Procedure: LEFT BREAST LUMPECTOMY WITH RADIOACTIVE SEED AND LEFT AXILLARY SENTINEL LYMPH NODE BIOPSY;  Surgeon: Excell Seltzer, MD;  Location: [facility];  Service: General;  Laterality: Left;  . TUBAL LIGATION  1970     OB History    Gravida  4   Para  4   Term  4   Preterm      AB      Living  4     SAB      TAB      Ectopic      Multiple      Live Births              Family History  Problem Relation Age of Onset  . Alzheimer's disease Father   . Cancer Mother        throat cancer- smoker and ETOH   . Heart disease Neg Hx   . Diabetes Neg Hx     Social History   Tobacco Use  . Smoking status: Never Smoker  . Smokeless tobacco: Never Used  Substance Use Topics  . Alcohol use: Yes    Alcohol/week: 21.0 standard drinks  Types: 21 Cans of beer per week  . Drug use: No    Comment: in the past cocaine. none since 2010.     Home Medications Prior to Admission medications   Medication Sig Start Date End Date Taking? Authorizing Provider  amLODipine (NORVASC) 10 MG tablet Take 1 tablet (10 mg total) by mouth daily. 03/26/19 03/25/20  Neva Seat, MD  anastrozole (ARIMIDEX) 1 MG tablet Take 1 tablet (1 mg total) by mouth daily. 10/23/19   Gardenia Phlegm, NP  atorvastatin (LIPITOR) 40 MG tablet Take 1 tablet (40 mg total) by mouth daily. 02/14/19   Neva Seat, MD  gabapentin (NEURONTIN) 300 MG capsule Take 1 capsule (300 mg total) by mouth at bedtime. 08/30/18   Magrinat, Virgie Dad, MD  hydrochlorothiazide (HYDRODIURIL) 25 MG tablet TAKE 1 TABLET (25 MG TOTAL) BY MOUTH DAILY. 11/08/19   Neva Seat, MD  methocarbamol (ROBAXIN) 500 MG tablet Take 1  tablet (500 mg total) by mouth every 6 (six) hours as needed for muscle spasms. 10/10/19   Katherine Roan, MD  ondansetron (ZOFRAN ODT) 8 MG disintegrating tablet Take 1 tablet (8 mg total) by mouth every 8 (eight) hours as needed for nausea or vomiting. 10/07/19   Molpus, Jenny Reichmann, MD  oxyCODONE-acetaminophen (PERCOCET) 5-325 MG tablet Take 1 tablet by mouth every 4 (four) hours as needed for severe pain (may cause constipation). 10/07/19   Molpus, John, MD  senna-docusate (SENOKOT S) 8.6-50 MG tablet Take 1 tablet by mouth at bedtime as needed for mild constipation. 10/10/19   Katherine Roan, MD  Vitamin D, Ergocalciferol, (DRISDOL) 1.25 MG (50000 UT) CAPS capsule Take 50,000 Units by mouth every 7 (seven) days.    [provider]    Allergies    Patient has no known allergies.  Review of Systems   Review of Systems  Constitutional: Negative for fever.  HENT: Negative for congestion.   Respiratory: Negative for shortness of breath.   Cardiovascular: Negative for chest pain.  Gastrointestinal: Negative for abdominal distention.  Musculoskeletal:       Right hip pain, left knee pain  Neurological: Negative for dizziness and headaches.    Physical Exam Updated Vital Signs BP 134/83 (BP Location: Right Arm)   Pulse 92   Temp 99.2 F (37.3 C) (Oral)   Resp 18   SpO2 100%   Physical Exam Vitals and nursing note reviewed.  Constitutional:      General: She is not in acute distress.    Appearance: Normal appearance. She is not ill-appearing.  HENT:     Head: Normocephalic and atraumatic.  Eyes:     General: No scleral icterus.       Right eye: No discharge.        Left eye: No discharge.     Conjunctiva/sclera: Conjunctivae normal.  Pulmonary:     Effort: Pulmonary effort is normal.     Breath sounds: No stridor.  Musculoskeletal:     Comments: Does palpation of the right lateral hip over the greater trochanter, full range of motion of right hip.  Tenderness to  palpation of the left knee, positive ballottement and balloon test.  Full range of motion of left knee no warmth to touch no redness.  No cellulitic changes.  No calf swelling or edema.  No lower extremity edema bilaterally.  No other bony tenderness over joints or long bones of the upper and lower extremities.     No neck or back midline tenderness, step-off, deformity,  or bruising. Able to turn head left and right 45 degrees without difficulty.  Full range of motion of upper and lower extremity joints shown after palpation was conducted; with 5/5 symmetrical strength in upper and lower extremities. No chest wall tenderness, no facial or cranial tenderness.   Patient has intact sensation grossly in lower and upper extremities. Intact patellar and ankle reflexes. Patient able to ambulate without difficulty.  Radial and DP pulses palpated BL.   Neurological:     Mental Status: She is alert and oriented to person, place, and time. Mental status is at baseline.     ED Results / Procedures / Treatments   Labs (all labs ordered are listed, but only abnormal results are displayed) Labs Reviewed - No data to display  EKG None  Radiology DG Knee Complete 4 Views Left  Result Date: 01/28/2020 CLINICAL DATA:  Left knee pain after fall EXAM: LEFT KNEE - COMPLETE 4+ VIEW COMPARISON:  None. FINDINGS: No evidence of fracture or dislocation. Trace joint effusion. No evidence of arthropathy or other focal bone abnormality. Soft tissues are unremarkable. IMPRESSION: Trace joint effusion, nonspecific.  No acute osseous abnormality. Electronically Signed   By: Davina Poke D.O.   On: 01/28/2020 12:09   DG HIP UNILAT WITH PELVIS 2-3 VIEWS RIGHT  Result Date: 01/28/2020 CLINICAL DATA:  Right hip pain after fall EXAM: DG HIP (WITH OR WITHOUT PELVIS) 2-3V RIGHT COMPARISON:  10/07/2019 FINDINGS: There is no evidence of hip fracture or dislocation. Subtle lucencies at the bilateral femoral head-neck  junctions suggestive of synovial herniation pits. Hip joint space is maintained without evidence of significant arthropathy. Sclerotic changes again noted along the right SI joint and parasymphyseal aspect of the right pubic bone. IMPRESSION: No acute osseous abnormality, right hip. Electronically Signed   By: Davina Poke D.O.   On: 01/28/2020 12:12    Procedures Procedures (including critical care time)  Medications Ordered in ED Medications - No data to display  ED Course  I have reviewed the triage vital signs and the nursing notes.  Pertinent labs & imaging results that were available during my care of the patient were reviewed by me and considered in my medical decision making (see chart for details).  Clinical Course as of Jan 28 1232  Mon Jan 28, 2020  1230 Left knee plain film and right hip No acute bony abnormalities.  No fractures.  There is trace effusion agree of radiology read.    [WF]    Clinical Course User Index [WF] Tedd Sias, [place]   MDM Rules/Calculators/A&P                          Patient is a 69 year old female with no pertinent past medical history presented today with left knee pain primarily but also complains some right hip pain.  She states she had a fall yesterday at work and states it has been painful ever since.  She has noticed some swelling in the left knee.  It is achy, constant, worse with touch and movement.  She states her right hip pain is very mild and she has been able to walk without difficulty.  She primarily is presented to the ED for evaluation because of her concern for fracture.  I have low suspicion for fracture given my physical exam which is notable tenderness to palpation of the left knee that is diffuse and right hip tenderness to palpation of the lateral greater trochanteric region,  forage motion of both joints.  Doubt septic arthritis or even bursitis, suspect bruising and contusion.  Likely : Traumatic effusion.  Will examine  with x-rays and likely place in knee hinged brace and follow-up with PCP.  X-rays negative.  Patient discharged with conservative therapy.  Final Clinical Impression(s) / ED Diagnoses Final diagnoses:  Acute pain of left knee  Right hip pain  Fall, initial encounter    Rx / DC Orders ED Discharge Orders    None       Tedd Sias, [place] 01/28/20 1233    Blanchie Dessert, MD 01/29/20 1158

## 2020-01-28 NOTE — Discharge Instructions (Addendum)
Your x-rays were normal with no fractures.  There is a little bit of fluid in your left knee this is likely because of the fall.  This should improve with rest ice and elevation.  Please use Tylenol ibuprofen as described below.  Please drink plenty of water.  I also prescribed you a knee brace which she should placed on your knee which will help with your discomfort.  You may also request crutches if you would like them.  If you continue to have issues you may follow-up with the orthopedic doctor.  Otherwise follow-up with your primary care doctor.  This should resolve on its own.  Please use Tylenol or ibuprofen for pain.  You may use 400 mg ibuprofen every 6 hours or 1000 mg of Tylenol every 6 hours.  You may choose to alternate between the 2.  This would be most effective.  Not to exceed 4 g of Tylenol within 24 hours.  Not to exceed 3200 mg ibuprofen 24 hours.

## 2020-02-05 ENCOUNTER — Telehealth: Payer: Self-pay | Admitting: Student

## 2020-02-05 NOTE — Telephone Encounter (Signed)
Pt appt 02/12/2020 @ 3:00 with Dr. Bridgett Larsson.

## 2020-02-05 NOTE — Telephone Encounter (Signed)
Pt needs appt w/ pcp as soon as can be scheduled

## 2020-02-05 NOTE — Telephone Encounter (Signed)
Pls contact pt [phone]

## 2020-02-07 MED FILL — HYDROCHLOROTHIAZIDE 25 MG T: 25 | 90 days supply | Qty: 90 | Fill #1

## 2020-02-12 ENCOUNTER — Telehealth: Payer: Self-pay | Admitting: *Deleted

## 2020-02-12 ENCOUNTER — Other Ambulatory Visit: Payer: Self-pay | Admitting: Student

## 2020-02-12 ENCOUNTER — Encounter: Payer: Medicare Other | Admitting: Student

## 2020-02-12 ENCOUNTER — Other Ambulatory Visit (HOSPITAL_COMMUNITY): Payer: Self-pay | Admitting: Internal Medicine

## 2020-02-12 MED ORDER — ATORVASTATIN CALCIUM 40 MG PO TABS: 40.0000 mg | ORAL_TABLET | Freq: Every day | ORAL | 3 refills | Status: DC

## 2020-02-12 MED FILL — ATORVASTATIN 40 MG TABLET: 40 | 90 days supply | Qty: 90 | Fill #0

## 2020-02-12 NOTE — Telephone Encounter (Signed)
Need refill on atorvastatin (LIPITOR) 40 MG tablet  ;pt contact [place], [place] - [address] [facility].

## 2020-02-12 NOTE — Telephone Encounter (Signed)
Pt did not come to her appt this afternoon. Called pt - stated she forgot about her appt;"I'm so sorry". Appt has been re-schedule for 9/20@ 1330 PM.

## 2020-03-03 ENCOUNTER — Ambulatory Visit (INDEPENDENT_AMBULATORY_CARE_PROVIDER_SITE_OTHER): Payer: Medicare Other | Admitting: Student

## 2020-03-03 ENCOUNTER — Other Ambulatory Visit: Payer: Self-pay

## 2020-03-03 ENCOUNTER — Encounter: Payer: Self-pay | Admitting: Student

## 2020-03-03 DIAGNOSIS — Z170 Estrogen receptor positive status [ER+]: Secondary | ICD-10-CM

## 2020-03-03 DIAGNOSIS — C50212 Malignant neoplasm of upper-inner quadrant of left female breast: Secondary | ICD-10-CM

## 2020-03-03 DIAGNOSIS — M816 Localized osteoporosis [Lequesne]: Secondary | ICD-10-CM

## 2020-03-03 DIAGNOSIS — I10 Essential (primary) hypertension: Secondary | ICD-10-CM | POA: Diagnosis not present

## 2020-03-03 DIAGNOSIS — Z0000 Encounter for general adult medical examination without abnormal findings: Secondary | ICD-10-CM | POA: Diagnosis not present

## 2020-03-03 NOTE — Assessment & Plan Note (Addendum)
BP 134/75 today. She has been taking HCTZ 25 mg and amlodipine 10 mg daily without side effects. She states she does not monitor her blood pressures at home. On chart review, her BP from her 10/23/19 oncology visit was 138/72. Denies headache, dizziness, or lightheadedness.  A/P: Given she is so close to goal, will not change her antihypertensive regimen at this time.  - HCTZ 25 mg daily - amlodipine 10 mg daily - Discussed importance of healthy eating, including strategies such as incorporating more vegetables, fruits, and lean proteins, while cutting down on sugary beverages, sweet treats, and processed foods - Discussed decreasing salt intake - Discussed goal of 150 to 300 minutes (2  to 5 hours) a week of moderate-intensity activity such as brisk walking - Last sCr 0.86 from 10/2019 lab work. Consider repeating BMP at next visit.  Instructed patient to follow-up in 6 months.

## 2020-03-03 NOTE — Assessment & Plan Note (Signed)
-   Flu shot: patient will have flu shot through employer [employer]) for insurance and records purposes - COVID-19 vaccination: patient reports she received both doses and will bring vaccination card in later this week - Colon cancer screening: up to date; last colonoscopy was 03/23/17. Patient reports she was instructed to have surveillance every 5 years, so she will be due on or after 03/23/22. - Pap smear: N/A; patient is s/p hysterectomy - Mammography per breast oncologists

## 2020-03-03 NOTE — Assessment & Plan Note (Signed)
Patient reports she is nearing completion of a 5-year course of anastrazole. Next oncology appointment 04/24/20.

## 2020-03-03 NOTE — Progress Notes (Signed)
   CC: "doing well"  HPI:  Marissa Williams is a 69 y.o. woman with history of HTN, left breast cancer on anastrozole, and osteoporosis who presents to clinic for follow-up of HTN. Her last clinic visit was with Dr. Shan Levans on 10/10/19. Since then, she was seen by her oncologist on 10/23/19 as well as in the [facility] ED on 01/28/20 after a mechanical fall.   She states she is doing well, has "no pain and no complaints." Left knee pain and R hip pain from after fall have since resolved.   To see the details of this patient's management of their acute and chronic problems, please refer to the Assessment & Plan under the Encounters tab.    Past Medical History:  Diagnosis Date  . Breast cancer of upper-inner quadrant of left female breast ([place]) 01/27/2015  . Hypertension Dx 2008  . Osteopenia   . Personal history of radiation therapy   . Radiation 03/19/15-05/02/15   left breast   Review of Systems:    Review of Systems  Constitutional: Negative for chills, fever, malaise/fatigue and weight loss.  Eyes: Negative for blurred vision.  Respiratory: Negative for cough and shortness of breath.   Cardiovascular: Negative for chest pain and palpitations.  Gastrointestinal: Negative for abdominal pain.  Genitourinary: Negative for dysuria.  Musculoskeletal: Negative for back pain.  Neurological: Negative for dizziness and headaches.    Physical Exam:  Vitals:   03/03/20 1336  BP: 134/75  Pulse: 98  Weight: 156 lb 3.2 oz (70.9 kg)  Height: 5\' 3"  (1.6 m)   Constitutional: well-appearing woman sitting in chair, in no acute distress HENT: normocephalic atraumatic, mucous membranes moist Eyes: conjunctivae non-erythematous Neck: supple Cardiovascular: regular rate and rhythm Pulmonary/Chest: normal work of breathing on room air, clear to auscultation bilaterally Abdominal: non-distended Neurological: alert & oriented x 3   Assessment & Plan:   See Encounters Tab for problem  based charting.  Patient seen with Dr. Dareen Piano

## 2020-03-03 NOTE — Patient Instructions (Addendum)
Marissa Williams,   Happy bel[birth year] birthday!! Thank you for your visit to the [facility] today. It was a pleasure meeting you. Today we discussed the following:  1) High blood pressure: Your blood pressure was slightly above goal of <130/80 at 134/75.  - Continue taking your amlodipine 10 mg daily and HCTZ 25 mg daily - Recommend continuing to work on making healthy dietary choices, watching the amount of salt in your diet, and incorporating exercise into your routine. - Check your blood pressure once daily in the morning and record those values. See below for instructions on how best to check your blood pressure.  2) Health maintenance: - Bring your COVID-19 vaccination card to the clinic at your earliest convenience - Let us know when you have received your flu shot. - You are up-to-date on your colonoscopy.   We would like to see you back in 6 months. Please bring all of your medications with you.   If you have any questions or concerns, please call our clinic at [phone] between 9am-5pm. Outside of these hours, call [phone] and ask for the internal medicine resident on call. If you feel you are having a medical emergency please call 911.    HOW TO TAKE YOUR BLOOD PRESSURE:  Rest 5 minutes before taking your blood pressure.  Don't smoke or drink caffeinated beverages for at least 30 minutes before.  Take your blood pressure before (not after) you eat.  Sit comfortably with your back supported and both feet on the floor (don't cross your legs).  Elevate your arm to heart level on a table or a desk.  Use the proper sized cuff. It should fit smoothly and snugly around your bare upper arm. There should be enough room to slip a fingertip under the cuff. The bottom edge of the cuff should be 1 inch above the crease of the elbow.  Ideally, take 3 measurements at one sitting and record the average.

## 2020-03-03 NOTE — Assessment & Plan Note (Signed)
Patient reports she is receiving IM denosumab (Prolia) per her oncologist. She will be due for repeat bone density scan in 2022.  - daily calcium supplementation - vitamin D 50,000 U weekly - denosumab (Prolia) every 6 months per her oncologist - repeat DEXA in 2022

## 2020-03-04 NOTE — Progress Notes (Signed)
Internal Medicine Clinic Attending  I saw and evaluated the patient.  I personally confirmed the key portions of the history and exam documented by Dr. Watson and I reviewed pertinent patient test results.  The assessment, diagnosis, and plan were formulated together and I agree with the documentation in the resident's note.  

## 2020-03-19 ENCOUNTER — Other Ambulatory Visit: Payer: Self-pay | Admitting: Oncology

## 2020-03-19 DIAGNOSIS — Z9889 Other specified postprocedural states: Secondary | ICD-10-CM

## 2020-03-19 DIAGNOSIS — Z853 Personal history of malignant neoplasm of breast: Secondary | ICD-10-CM

## 2020-03-25 ENCOUNTER — Other Ambulatory Visit: Payer: Self-pay

## 2020-03-25 ENCOUNTER — Other Ambulatory Visit (HOSPITAL_COMMUNITY): Payer: Self-pay | Admitting: Internal Medicine

## 2020-03-25 DIAGNOSIS — I10 Essential (primary) hypertension: Secondary | ICD-10-CM

## 2020-03-25 MED ORDER — AMLODIPINE BESYLATE 10 MG PO TABS: 10.0000 mg | ORAL_TABLET | Freq: Every day | ORAL | 3 refills | Status: DC

## 2020-03-28 MED FILL — AMLODIPINE BESYLATE 10 MG T: 10 | 90 days supply | Qty: 90 | Fill #0

## 2020-04-08 ENCOUNTER — Ambulatory Visit: Payer: Self-pay | Admitting: Internal Medicine

## 2020-04-15 MED FILL — ANASTROZOLE 1 MG TABLET: 1 | 90 days supply | Qty: 90 | Fill #2

## 2020-04-21 ENCOUNTER — Other Ambulatory Visit: Payer: Self-pay

## 2020-04-21 DIAGNOSIS — C50212 Malignant neoplasm of upper-inner quadrant of left female breast: Secondary | ICD-10-CM

## 2020-04-21 DIAGNOSIS — Z170 Estrogen receptor positive status [ER+]: Secondary | ICD-10-CM

## 2020-04-21 MED ORDER — ANASTROZOLE 1 MG PO TABS: 1.0000 mg | ORAL_TABLET | Freq: Every day | ORAL | 0 refills | Status: AC

## 2020-04-24 ENCOUNTER — Other Ambulatory Visit: Payer: Medicare Other

## 2020-04-24 ENCOUNTER — Ambulatory Visit: Payer: Medicare Other

## 2020-04-24 ENCOUNTER — Ambulatory Visit: Payer: Medicare Other | Admitting: Oncology

## 2020-04-30 ENCOUNTER — Ambulatory Visit
Admission: RE | Admit: 2020-04-30 | Discharge: 2020-04-30 | Disposition: A | Discharge status: Home / Self Care | Payer: Medicare Other | Source: Ambulatory Visit | Attending: Oncology | Admitting: Oncology

## 2020-04-30 ENCOUNTER — Other Ambulatory Visit: Payer: Self-pay

## 2020-04-30 DIAGNOSIS — Z9889 Other specified postprocedural states: Secondary | ICD-10-CM

## 2020-04-30 DIAGNOSIS — Z853 Personal history of malignant neoplasm of breast: Secondary | ICD-10-CM

## 2020-05-05 ENCOUNTER — Other Ambulatory Visit: Payer: Self-pay

## 2020-05-05 ENCOUNTER — Inpatient Hospital Stay: Payer: Medicare Other | Attending: Adult Health

## 2020-05-05 ENCOUNTER — Inpatient Hospital Stay: Payer: Medicare Other

## 2020-05-05 ENCOUNTER — Inpatient Hospital Stay: Payer: Medicare Other | Admitting: Adult Health

## 2020-05-05 ENCOUNTER — Encounter: Payer: Self-pay | Admitting: Adult Health

## 2020-05-05 ENCOUNTER — Telehealth: Payer: Self-pay | Admitting: Adult Health

## 2020-05-05 VITALS — BP 135/88 | HR 70 | Temp 97.5°F | Resp 18 | Ht 63.0 in | Wt 157.0 lb

## 2020-05-05 DIAGNOSIS — Z79811 Long term (current) use of aromatase inhibitors: Secondary | ICD-10-CM | POA: Diagnosis not present

## 2020-05-05 DIAGNOSIS — Z923 Personal history of irradiation: Secondary | ICD-10-CM | POA: Insufficient documentation

## 2020-05-05 DIAGNOSIS — E559 Vitamin D deficiency, unspecified: Secondary | ICD-10-CM

## 2020-05-05 DIAGNOSIS — C50212 Malignant neoplasm of upper-inner quadrant of left female breast: Secondary | ICD-10-CM | POA: Diagnosis present

## 2020-05-05 DIAGNOSIS — Z79899 Other long term (current) drug therapy: Secondary | ICD-10-CM | POA: Diagnosis not present

## 2020-05-05 DIAGNOSIS — Z170 Estrogen receptor positive status [ER+]: Secondary | ICD-10-CM

## 2020-05-05 DIAGNOSIS — I10 Essential (primary) hypertension: Secondary | ICD-10-CM | POA: Insufficient documentation

## 2020-05-05 DIAGNOSIS — Z801 Family history of malignant neoplasm of trachea, bronchus and lung: Secondary | ICD-10-CM | POA: Diagnosis not present

## 2020-05-05 DIAGNOSIS — M816 Localized osteoporosis [Lequesne]: Secondary | ICD-10-CM

## 2020-05-05 DIAGNOSIS — M810 Age-related osteoporosis without current pathological fracture: Secondary | ICD-10-CM | POA: Insufficient documentation

## 2020-05-05 LAB — CMP (CANCER CENTER ONLY)
ALT: 23 U/L (ref 0–44)
AST: 23 U/L (ref 15–41)
Albumin: 4.1 g/dL (ref 3.5–5.0)
Alkaline Phosphatase: 48 U/L (ref 38–126)
Anion gap: 9 (ref 5–15)
BUN: 14 mg/dL (ref 8–23)
CO2: 27 mmol/L (ref 22–32)
Calcium: 9.5 mg/dL (ref 8.9–10.3)
Chloride: 103 mmol/L (ref 98–111)
Creatinine: 0.91 mg/dL (ref 0.44–1.00)
GFR, Estimated: >60 mL/min (ref >60)
Glucose, Bld: 87 mg/dL (ref 70–99)
Potassium: 3.4 mmol/L — ABNORMAL LOW (ref 3.5–5.1)
Sodium: 139 mmol/L (ref 135–145)
Total Bilirubin: 0.6 mg/dL (ref 0.3–1.2)
Total Protein: 7.8 g/dL (ref 6.5–8.1)

## 2020-05-05 LAB — CBC WITH DIFFERENTIAL (CANCER CENTER ONLY)
Abs Immature Granulocytes: 0.02 10*3/uL (ref 0.00–0.07)
Basophils Absolute: 0.1 10*3/uL (ref 0.0–0.1)
Basophils Relative: 1 %
Eosinophils Absolute: 0.1 10*3/uL (ref 0.0–0.5)
Eosinophils Relative: 1 %
HCT: 35.5 % — ABNORMAL LOW (ref 36.0–46.0)
Hemoglobin: 12.9 g/dL (ref 12.0–15.0)
Immature Granulocytes: 0 %
Lymphocytes Relative: 30 %
Lymphs Abs: 2.1 10*3/uL (ref 0.7–4.0)
MCH: 28.9 pg (ref 26.0–34.0)
MCHC: 36.3 g/dL — ABNORMAL HIGH (ref 30.0–36.0)
MCV: 79.4 fL — ABNORMAL LOW (ref 80.0–100.0)
Monocytes Absolute: 0.6 10*3/uL (ref 0.1–1.0)
Monocytes Relative: 8 %
Neutro Abs: 4.3 10*3/uL (ref 1.7–7.7)
Neutrophils Relative %: 60 %
Platelet Count: 308 10*3/uL (ref 150–400)
RBC: 4.47 MIL/uL (ref 3.87–5.11)
RDW: 13.9 % (ref 11.5–15.5)
WBC Count: 7.1 10*3/uL (ref 4.0–10.5)
nRBC: 0 % (ref 0.0–0.2)

## 2020-05-05 LAB — VITAMIN D 25 HYDROXY (VIT D DEFICIENCY, FRACTURES): Vit D, 25-Hydroxy: 72.44 ng/mL (ref 30–100)

## 2020-05-05 MED ORDER — DENOSUMAB 60 MG/ML ~~LOC~~ SOSY
PREFILLED_SYRINGE | SUBCUTANEOUS | Status: AC
  Filled 2020-05-05: qty 1

## 2020-05-05 NOTE — Telephone Encounter (Signed)
Scheduled appts per 11/22 los. Pt stated she would refer to mychart for appts.

## 2020-05-05 NOTE — Patient Instructions (Signed)

## 2020-05-05 NOTE — Progress Notes (Addendum)
[place]  [phone] [phone]    ID: Vertell Limber DOB: 09/08/1950  MR#: [id number]  PXT#:[id number]  Patient Care Team: Andrew Au, MD as PCP - General Excell Seltzer, MD (Inactive) as Consulting Physician (General Surgery) Magrinat, Virgie Dad, MD as Consulting Physician (Oncology) Gery Pray, MD as Consulting Physician (Radiation Oncology) Mauro Kaufmann, RN as Registered Nurse Rockwell Germany, RN as Registered Nurse Jake Shark, Johny Blamer, NP as Nurse Practitioner (Nurse Practitioner) Juanita Craver, MD as Consulting Physician (Gastroenterology) OTHER MD:   CHIEF COMPLAINT: Estrogen receptor positive breast cancer  CURRENT TREATMENT: Anastrozole  BREAST CANCER HISTORY: From the original intake note:  Faylynn noted a change in her left breast sometime in June 2016. She had had bilateral screening mammography at [facility] 02/12/2014 with no suspicious findings. Eventually [name] brought her concern to medical attention and her primary care physician set her up for bilateral left diagnostic mammography with tomography and [facility] August 03/04/2015. The breast density was category B. In the region of a palpable mass (10:30 position left breast 9 cm from the nipple) there was a spiculated mass measuring approximately 10 mm on mammography. On ultrasound this was an irregular hypoechoic mass measuring 9 mm. There was no internal vascular flow. The mass abuts the pectoralis muscle but does not appear to invade it. Left axillary ultrasound showed normal appearing lymph nodes.  Biopsy of the left breast mass in question 01/21/2015 showed ([id number]) an invasive ductal carcinoma, E-cadherin positive, grade 2, estrogen receptor 100% positive, with strong staining intensity; progesterone receptor negative, with an MIB-1 of 20% and no HER-2 amplification, the signals ratio being 1.48 and the  number per cell 2.00.  Her subsequent history is as detailed below   INTERVAL HISTORY: Allien returns today for follow-up and treatment of her estrogen receptor positive breast cancer.   She continues on anastrozole.  She has mild tolerable hot flashes, and no vaginal dryness.  She takes gabapentin at night which helps her to be able to sleep.    She underwent bilateral diagnostic mammogram on 04/30/2020 that showed no evidence of malignancy and breast density category C.    She also underwent bone density testing which showed osteoporosis with a T score of -2.5 in the right femur.  She is taking calcium and vitamin d to help with her bones.  REVIEW OF SYSTEMS: Merrie is doing well today.  She has no new issues.  She is without fever, chills, chest pain, palpitations, cough, shortness of breath, headaches, vision issues, bowel/bladder changes, nausea, vomiting, or any other concerns.  A detailed ROS was otherwise non contributory today.    She notes that she is up to date with seeing her PCP and is up to date with her cancer screenings.    PAST MEDICAL HISTORY: Past Medical History:  Diagnosis Date  . Breast cancer of upper-inner quadrant of left female breast ([place]) 01/27/2015  . Hypertension Dx 2008  . Osteopenia   . Personal history of radiation therapy   . Radiation 03/19/15-05/02/15   left breast  . Therapeutic opioid induced constipation 10/10/2019    PAST SURGICAL HISTORY: Past Surgical History:  Procedure Laterality Date  . ABDOMINAL HYSTERECTOMY  1980   uterine prolapse   . BREAST LUMPECTOMY Left 2016  . BREAST LUMPECTOMY WITH RADIOACTIVE SEED AND SENTINEL LYMPH NODE BIOPSY Left 02/06/2015   Procedure: LEFT BREAST LUMPECTOMY WITH RADIOACTIVE SEED AND LEFT AXILLARY SENTINEL LYMPH NODE BIOPSY;  Surgeon: Excell Seltzer, MD;  Location: [facility];  Service: General;  Laterality: Left;  . TUBAL LIGATION  1970    FAMILY HISTORY Family History  Problem  Relation Age of Onset  . Alzheimer's disease Father   . Cancer Mother        throat cancer- smoker and ETOH   . Heart disease Neg Hx   . Diabetes Neg Hx    The patient's father died at the age of 37 with Alzheimer's disease. The patient's mother died from what seemed to have been DTs at the age of 50. She had a history of throat cancer and EtOH. The patient was an only child. There is no other history of cancer in the family to her knowledge    GYNECOLOGIC HISTORY:  No LMP recorded. Patient has had a hysterectomy.  menarche age 69, first live birth age 69. The patient is GX P4. She underwent hysterectomy without salpingo-oophorectomy in 1980. She did not take hormone replacement.    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 She is single, and lives by herself, with no pets. Her son [name] lives in [place] where he works as an Surveyor, minerals. Her son [name] is a Pharmacist, hospital working in [place]. Daughter [name] lives in [place] currently under unfortunate circumstances. The patient's fourth child a son, was born premature and died at 74 months from lung problems. [name] has 7 grandchildren and 3 great-grandchildren. She attends a local [employer]:  not in place    HEALTH MAINTENANCE: Social History   Tobacco Use  . Smoking status: Never Smoker  . Smokeless tobacco: Never Used  Substance Use Topics  . Alcohol use: Yes    Alcohol/week: 21.0 standard drinks    Types: 21 Cans of beer per week  . Drug use: No    Comment: in the past cocaine. none since 2010.      Colonoscopy: October 2014/[facility]  PAP: status post hysterectomy   Bone density: never   Lipid panel:  No Known Allergies  Current Outpatient Medications  Medication Sig Dispense Refill  . amLODipine (NORVASC) 10 MG tablet Take 1 tablet (10 mg total) by mouth daily. 90 tablet 3  . anastrozole  (ARIMIDEX) 1 MG tablet Take 1 tablet (1 mg total) by mouth daily. 90 tablet 0  . atorvastatin (LIPITOR) 40 MG tablet Take 1 tablet (40 mg total) by mouth daily. 90 tablet 3  . hydrochlorothiazide (HYDRODIURIL) 25 MG tablet TAKE 1 TABLET (25 MG TOTAL) BY MOUTH DAILY. 90 tablet 1  . Vitamin D, Ergocalciferol, (DRISDOL) 1.25 MG (50000 UT) CAPS capsule Take 50,000 Units by mouth every 7 (seven) days.     No current facility-administered medications for this visit.    OBJECTIVE: Middle-aged African-American woman who appears well  Vitals:   05/05/20 1342  BP: 135/88  Pulse: 70  Resp: 18  Temp: (!) 97.5 F (36.4 C)  SpO2: 100%     Body mass index is 27.81 kg/m.    ECOG FS:0 - Asymptomatic  GENERAL: Patient is a well appearing female in no acute distress HEENT:  Sclerae anicteric.  Mask in place. Neck is supple.  NODES:  No cervical, supraclavicular, or axillary lymphadenopathy palpated.  BREAST EXAM:  S/p left breast lumpectomy and radiation, no sign of local recurrence, right breast benign LUNGS:  Clear to auscultation bilaterally.  No wheezes or rhonchi. HEART:  Regular rate and rhythm. No murmur appreciated. ABDOMEN:  Soft, nontender.  Positive, normoactive bowel sounds. No organomegaly palpated. MSK:  No focal spinal tenderness to palpation. Full range of motion bilaterally in the upper extremities. EXTREMITIES:  No peripheral edema.   SKIN:  Clear with no obvious rashes or skin changes. No nail dyscrasia. NEURO:  Nonfocal. Well oriented.  Appropriate affect.     LAB RESULTS:  CMP     Component Value Date/Time   NA 142 10/30/2019 1311   NA 140 04/19/2018 0904   NA 139 02/28/2017 1250   K 3.7 10/30/2019 1311   K 3.5 02/28/2017 1250   CL 105 10/30/2019 1311   CO2 26 10/30/2019 1311   CO2 27 02/28/2017 1250   GLUCOSE 97 10/30/2019 1311   GLUCOSE 96 02/28/2017 1250   BUN 19 10/30/2019 1311   BUN 21 04/19/2018 0904   BUN 15.0 02/28/2017 1250   CREATININE 0.86 10/30/2019  1311   CREATININE 0.8 02/28/2017 1250   CALCIUM 9.2 10/30/2019 1311   CALCIUM 10.1 02/28/2017 1250   PROT 7.3 10/30/2019 1311   PROT 7.6 04/19/2018 0904   PROT 7.8 02/28/2017 1250   ALBUMIN 3.9 10/30/2019 1311   ALBUMIN 4.7 04/19/2018 0904   ALBUMIN 4.1 02/28/2017 1250   AST 27 10/30/2019 1311   AST 19 02/28/2017 1250   ALT 31 10/30/2019 1311   ALT 15 02/28/2017 1250   ALKPHOS 63 10/30/2019 1311   ALKPHOS 64 02/28/2017 1250   BILITOT 0.6 10/30/2019 1311   BILITOT 0.77 02/28/2017 1250   GFRNONAA >60 10/30/2019 1311   GFRNONAA >89 04/16/2014 1141   GFRAA >60 10/30/2019 1311   GFRAA >89 04/16/2014 1141    INo results found for: SPEP, UPEP  Lab Results  Component Value Date   WBC 7.1 05/05/2020   NEUTROABS 4.3 05/05/2020   HGB 12.9 05/05/2020   HCT 35.5 (L) 05/05/2020   MCV 79.4 (L) 05/05/2020   PLT 308 05/05/2020      Chemistry      Component Value Date/Time   NA 142 10/30/2019 1311   NA 140 04/19/2018 0904   NA 139 02/28/2017 1250   K 3.7 10/30/2019 1311   K 3.5 02/28/2017 1250   CL 105 10/30/2019 1311   CO2 26 10/30/2019 1311   CO2 27 02/28/2017 1250   BUN 19 10/30/2019 1311   BUN 21 04/19/2018 0904   BUN 15.0 02/28/2017 1250   CREATININE 0.86 10/30/2019 1311   CREATININE 0.8 02/28/2017 1250      Component Value Date/Time   CALCIUM 9.2 10/30/2019 1311   CALCIUM 10.1 02/28/2017 1250   ALKPHOS 63 10/30/2019 1311   ALKPHOS 64 02/28/2017 1250   AST 27 10/30/2019 1311   AST 19 02/28/2017 1250   ALT 31 10/30/2019 1311   ALT 15 02/28/2017 1250   BILITOT 0.6 10/30/2019 1311   BILITOT 0.77 02/28/2017 1250       No results found for: LABCA2  No components found for: LABCA125  No results for input(s): INR in the last 168 hours.  Urinalysis    Component Value Date/Time   COLORURINE YELLOW 10/06/2019 2224   APPEARANCEUR CLEAR 10/06/2019 2224   LABSPEC 1.013 10/06/2019 2224   PHURINE 6.0 10/06/2019 2224   GLUCOSEU NEGATIVE 10/06/2019 2224   HGBUR  MODERATE (A) 10/06/2019 2224   BILIRUBINUR NEGATIVE 10/06/2019 2224   BILIRUBINUR negative 04/19/2018 0849   KETONESUR NEGATIVE 10/06/2019 2224   PROTEINUR NEGATIVE 10/06/2019 2224   UROBILINOGEN 0.2  04/19/2018 0849   UROBILINOGEN 1.0 03/06/2009 1804   NITRITE NEGATIVE 10/06/2019 2224   LEUKOCYTESUR MODERATE (A) 10/06/2019 2224    STUDIES: CLINICAL DATA:  69 year old female with history of left breast cancer in 2016 status post lumpectomy and radiation. Patient presents for annual exam. No new problems.  EXAM: DIGITAL DIAGNOSTIC BILATERAL MAMMOGRAM WITH TOMO AND CAD  COMPARISON:  Previous exam(s).  ACR Breast Density Category c: The breast tissue is heterogeneously dense, which may obscure small masses.  FINDINGS: Right breast: No suspicious mass, distortion, or microcalcifications are identified to suggest presence of malignancy.  Left breast: Spot 2D magnification views of the lumpectomy site were performed in addition to standard views. There are stable postsurgical changes. No suspicious mass, distortion, or microcalcifications are identified to suggest presence of malignancy.  Mammographic images were processed with CAD.  IMPRESSION: Benign postsurgical changes in the left breast. No mammographic evidence of malignancy bilaterally.  RECOMMENDATION: Screening mammogram in one year.(Code:[id number]-B-01Y)  I have discussed the findings and recommendations with the patient. If applicable, a reminder letter will be sent to the patient regarding the next appointment.  BI-RADS CATEGORY  2: Benign.   Electronically Signed   By: Audie Pinto M.D.   On: 04/30/2020 12:00   ASSESSMENT: 69 y.o. [place] woman status post left breast biopsy 01/21/2015 for a clinical T1b N0, stage IA invasive ductal carcinoma, grade 1 or 2, strongly estrogen receptor positive, progesterone receptor negative, with no HER-2 amplification and an MIB-1 of 20%  (1) left  lumpectomy and sentinel lymph node sampling 02/06/2015 showed a pT1c pN0, stage IA invasive ductal carcinoma, grade 2, with negative margins   (2) OncotypeDX score of 29 falls in the intermediate range and predicts a 10 year risk of outside the breast recurrence of 19% if the patient's only systemic therapy is tamoxifen for 5 years   (a) patient declined adjuvant chemotherapy  (3) adjuvant radiation 03/19/2015-05/02/2015: Left breast 50.4 Gy in 28 fx's; lumpectomy cavity boost 10 Gy in 5 fx's  (4) on anastrozole as of November 2016  (a) bone density 03/10/2016 showed a T score of -2.2.  (b) bone density on 04/09/2019 shows a T score of -2.5.     PLAN: Robynne is 5 years out from her stage IA breast cancer diagnosis.  She has no clinical or radiographic sign of breast cancer recurrence which is quite favorable.  She has completed 5 years of antiestrogen therapy with Anastrozole.  She has tolerated this well, with the exception of slight weakening of her bones.  She is looking forward to stopping the pill, which I let her know to go ahead and stop.    We discussed her bone health.  She will be due a bone density in 04/2021.  I recommended she continue calcium, vitamin d and weight bearing exercises for her bones.  I gave her a handout in her AVS regarding bone health.    She will need to continue mammograms and annual clinical breast exam.  She would like to participate in our long term survivorship clinic, which we are happy to arrange.  Avril was recommended to continue with healthy diet, exercise, and keeping up with her PCP appointments/health maintenance.   We will see her back in 1 year for long term survivorship f/u.  She knows to call for any questions that may arise between now and her next appointment.  We are happy to see her sooner if needed.   Total encounter time: 30 minutes*  Wilber Bihari,  NP 05/05/20 1:46 PM Medical Oncology and Hematology [facility] [place], [place] [address] Tel. [phone]    Fax. [phone]   ADDENDUM: I met briefly with Zareena just to underlined the information she received from my nurse practitioner.  Raven did a terrific job at staying ahead of her cancer and tolerated anastrozole well.  In her case I do not see any indication for continuing beyond 5 years and therefore we are stopping anastrozole at this point.  We keep her chart open an additional 10 years and I am available to her on an as-needed basis but as of now we are not making any further appointments with me here.  She will however continue to be followed by a survivorship  I personally saw this patient and performed a substantive portion of this encounter with the listed APP documented above.   Chauncey Cruel, MD Medical Oncology and Hematology [facility] [address] [place], [place] [address] Tel. [phone]    Fax. [phone]   *Total Encounter Time as defined by the Centers for Medicare and Medicaid Services includes, in addition to the face-to-face time of a patient visit (documented in the note above) non-face-to-face time: obtaining and reviewing outside history, ordering and reviewing medications, tests or procedures, care coordination (communications with other health care professionals or caregivers) and documentation in the medical record.

## 2020-05-07 ENCOUNTER — Inpatient Hospital Stay: Payer: Medicare Other

## 2020-05-14 MED FILL — ATORVASTATIN 40 MG TABLET: 40 | 90 days supply | Qty: 90 | Fill #1

## 2020-05-15 ENCOUNTER — Other Ambulatory Visit: Payer: Self-pay

## 2020-05-15 ENCOUNTER — Other Ambulatory Visit (HOSPITAL_COMMUNITY): Payer: Self-pay | Admitting: Student

## 2020-05-15 DIAGNOSIS — I10 Essential (primary) hypertension: Secondary | ICD-10-CM

## 2020-05-15 MED ORDER — HYDROCHLOROTHIAZIDE 25 MG PO TABS: 25.0000 mg | ORAL_TABLET | Freq: Every day | ORAL | 1 refills | Status: DC

## 2020-05-15 MED FILL — HYDROCHLOROTHIAZIDE 25 MG T: 25 | 90 days supply | Qty: 90 | Fill #0

## 2020-05-15 NOTE — Telephone Encounter (Signed)
Need refill on hydrochlorothiazide (HYDRODIURIL) 25 MG tablet ;pt contact [place], [place]

## 2020-06-02 ENCOUNTER — Encounter: Payer: Self-pay | Admitting: Family Medicine

## 2020-06-02 LAB — HM COLONOSCOPY

## 2020-06-11 ENCOUNTER — Encounter: Payer: Self-pay | Admitting: Student

## 2020-06-27 MED FILL — AMLODIPINE BESYLATE 10 MG T: 10 | 90 days supply | Qty: 90 | Fill #1

## 2020-08-01 ENCOUNTER — Other Ambulatory Visit (HOSPITAL_COMMUNITY): Payer: Self-pay | Admitting: Student

## 2020-08-12 MED FILL — ATORVASTATIN 40 MG TABLET: 40 | 90 days supply | Qty: 90 | Fill #2

## 2020-08-12 MED FILL — HYDROCHLOROTHIAZIDE 25 MG T: 25 | 90 days supply | Qty: 90 | Fill #1

## 2020-11-26 ENCOUNTER — Encounter: Payer: Self-pay | Admitting: *Deleted

## 2020-12-16 ENCOUNTER — Encounter: Payer: Self-pay | Admitting: *Deleted

## 2020-12-17 ENCOUNTER — Other Ambulatory Visit (HOSPITAL_COMMUNITY): Payer: Self-pay

## 2020-12-17 ENCOUNTER — Encounter: Payer: Self-pay | Admitting: Adult Health

## 2020-12-17 MED ORDER — GABAPENTIN 300 MG PO CAPS
300.0000 mg | ORAL_CAPSULE | Freq: Every day | ORAL | 3 refills | Status: AC
  Filled 2020-12-17: qty 90, 90d supply, fill #0

## 2020-12-17 MED ORDER — CALCIUM CARB-CHOLECALCIFEROL 500-400 MG-UNIT PO TABS: 2.0000 | ORAL_TABLET | Freq: Every day | ORAL | 3 refills | Status: AC

## 2020-12-22 ENCOUNTER — Other Ambulatory Visit (HOSPITAL_COMMUNITY): Payer: Self-pay

## 2021-02-23 ENCOUNTER — Other Ambulatory Visit (HOSPITAL_COMMUNITY): Payer: Self-pay

## 2021-02-23 MED ORDER — MELATONIN 5 MG PO CAPS: 5.0000 mg | ORAL_CAPSULE | ORAL | 0 refills | Status: AC | PRN

## 2021-04-10 ENCOUNTER — Other Ambulatory Visit: Payer: Self-pay

## 2021-04-10 DIAGNOSIS — C50212 Malignant neoplasm of upper-inner quadrant of left female breast: Secondary | ICD-10-CM

## 2021-04-10 NOTE — Progress Notes (Signed)
Pt called and LVM regarding MM needing to be scheduled. Orders placed to [facility] per NP as [place] [facility] had no availability. Pt is scheduled for 04/30/21 at 0800. Orders, demographic info and past MM faxed to [facility]. Left detailed VM for pt about appt, location and f/Iu with NP. Pt knows to call [facility] with any concerns.

## 2021-04-22 ENCOUNTER — Telehealth: Payer: Self-pay

## 2021-04-22 NOTE — Telephone Encounter (Signed)
Marissa Williams called to confirm her appointment scheduled for 11/29.  She also asked if there was anything specific she needed to request in regards to her screening mm and her having dense breasts.  Instructed pt to schedule screening mm and if density in her breasts is an issue then they will inform us.  Pt verbalized understanding and thanks

## 2021-05-05 ENCOUNTER — Encounter: Payer: Self-pay | Admitting: Adult Health

## 2021-05-05 ENCOUNTER — Encounter: Payer: Medicare Other | Admitting: Adult Health

## 2021-05-06 ENCOUNTER — Encounter: Payer: Medicare Other | Admitting: Adult Health

## 2021-05-12 ENCOUNTER — Inpatient Hospital Stay: Payer: Medicare HMO | Admitting: Adult Health

## 2021-05-25 ENCOUNTER — Encounter: Payer: Self-pay | Admitting: Adult Health

## 2021-05-26 ENCOUNTER — Other Ambulatory Visit: Payer: Self-pay

## 2021-05-26 ENCOUNTER — Encounter: Payer: Self-pay | Admitting: Adult Health

## 2021-05-26 ENCOUNTER — Inpatient Hospital Stay: Payer: Medicare HMO | Attending: Adult Health | Admitting: Adult Health

## 2021-05-26 VITALS — BP 144/84 | HR 79 | Temp 97.4°F | Resp 18 | Ht 63.0 in | Wt 160.4 lb

## 2021-05-26 DIAGNOSIS — C50212 Malignant neoplasm of upper-inner quadrant of left female breast: Secondary | ICD-10-CM | POA: Diagnosis not present

## 2021-05-26 DIAGNOSIS — Z923 Personal history of irradiation: Secondary | ICD-10-CM | POA: Diagnosis not present

## 2021-05-26 DIAGNOSIS — Z853 Personal history of malignant neoplasm of breast: Secondary | ICD-10-CM | POA: Insufficient documentation

## 2021-05-26 DIAGNOSIS — M8580 Other specified disorders of bone density and structure, unspecified site: Secondary | ICD-10-CM | POA: Insufficient documentation

## 2021-05-26 DIAGNOSIS — Z170 Estrogen receptor positive status [ER+]: Secondary | ICD-10-CM

## 2021-05-26 NOTE — Progress Notes (Signed)
[place]  [phone] Fax:([phone]    ID: Vertell Limber DOB: 1951/02/24  MR#: [id number]  CVE#:[id number]  Patient Care Team: Dorethea Clan, DO as PCP - General Magrinat, Virgie Dad, MD as Consulting Physician (Oncology) Gery Pray, MD as Consulting Physician (Radiation Oncology) Juanita Craver, MD as Consulting Physician (Gastroenterology) OTHER MD:   CHIEF COMPLAINT: Estrogen receptor positive breast cancer  CURRENT TREATMENT: Observation  BREAST CANCER HISTORY: From the original intake note:  Marissa Williams noted a change in her left breast sometime in June 2016. She had had bilateral screening mammography at [facility] 02/12/2014 with no suspicious findings. Eventually Marissa Williams brought her concern to medical attention and her primary care physician set her up for bilateral left diagnostic mammography with tomography and [facility] August 03/04/2015. The breast density was category B. In the region of a palpable mass (10:30 position left breast 9 cm from the nipple) there was a spiculated mass measuring approximately 10 mm on mammography. On ultrasound this was an irregular hypoechoic mass measuring 9 mm. There was no internal vascular flow. The mass abuts the pectoralis muscle but does not appear to invade it. Left axillary ultrasound showed normal appearing lymph nodes.  Biopsy of the left breast mass in question 01/21/2015 showed ([id number]) an invasive ductal carcinoma, E-cadherin positive, grade 2, estrogen receptor 100% positive, with strong staining intensity; progesterone receptor negative, with an MIB-1 of 20% and no HER-2 amplification, the signals ratio being 1.48 and the number per cell 2.00.  Her subsequent history is as detailed below   INTERVAL HISTORY: Marissa Williams returns today for follow-up and treatment of her estrogen receptor positive breast cancer.  She completed her anastrozole last year, and  opted to continuing long-term survivorship.  She is here today for her follow-up.  Her mammogram was completed yesterday.  She has a history of breast density category C.  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  She also plans on participating in Silver sneakers beginning in January, 2023.  She has no new breast concerns and overall is feeling quite well.  She notes her cancer screenings are up-to-date and she sees her primary care provider regularly.  REVIEW OF SYSTEMS: Review of Systems  Constitutional:  Negative for appetite change, chills, fatigue, fever and unexpected weight change.  HENT:   Negative for hearing loss, lump/mass and trouble swallowing.   Eyes:  Negative for eye problems and icterus.  Respiratory:  Negative for chest tightness, cough and shortness of breath.   Cardiovascular:  Negative for chest pain, leg swelling and palpitations.  Gastrointestinal:  Negative for abdominal distention, abdominal pain, constipation, diarrhea, nausea and vomiting.  Endocrine: Negative for hot flashes.  Genitourinary:  Negative for difficulty urinating.   Musculoskeletal:  Negative for arthralgias.  Skin:  Negative for itching and rash.  Neurological:  Negative for dizziness, extremity weakness, headaches and numbness.  Hematological:  Negative for adenopathy. Does not bruise/bleed easily.  Psychiatric/Behavioral:  Negative for depression. The patient is not nervous/anxious.      PAST MEDICAL HISTORY: Past Medical History:  Diagnosis Date   Breast cancer of upper-inner quadrant of left female breast ([place]) 01/27/2015   Hypertension Dx 2008   Osteopenia    Personal history of radiation therapy    Radiation 03/19/15-05/02/15   left breast   Therapeutic opioid induced constipation 10/10/2019    PAST SURGICAL HISTORY: Past Surgical History:  Procedure Laterality Date   ABDOMINAL HYSTERECTOMY  1980    uterine prolapse    BREAST LUMPECTOMY Left 2016   BREAST LUMPECTOMY WITH RADIOACTIVE SEED AND SENTINEL LYMPH NODE BIOPSY Left 02/06/2015   Procedure: LEFT BREAST LUMPECTOMY WITH RADIOACTIVE SEED AND LEFT AXILLARY SENTINEL LYMPH NODE BIOPSY;  Surgeon: Glenna Fellows, MD;  Location: [facility];  Service: General;  Laterality: Left;   TUBAL LIGATION  1970    FAMILY HISTORY Family History  Problem Relation Age of Onset   Alzheimer's disease Father    Cancer Mother        throat cancer- smoker and ETOH    Heart disease Neg Hx    Diabetes Neg Hx    The patient's father died at the age of 86 with Alzheimer's disease. The patient's mother died from what seemed to have been DTs at the age of 74. She had a history of throat cancer and EtOH. The patient was an only child. There is no other history of cancer in the family to her knowledge    GYNECOLOGIC HISTORY:  No LMP recorded. Patient has had a hysterectomy.  menarche age 55, first live birth age 7. The patient is GX P4. She underwent hysterectomy without salpingo-oophorectomy in 1980. She did not take hormone replacement.    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 She is single, and lives by herself, with no pets. Her son Marissa Williams lives in [place] where he works as an Government social research officer. Her son Marissa Williams is a Runner, broadcasting/film/video working in [place]. Daughter Marissa Williams lives in [place] currently under unfortunate circumstances. The patient's fourth child a son, was born premature and died at 16 months from lung problems. Marissa Williams has 7 grandchildren and 3 great-grandchildren. She attends a local [employer]     ADVANCED DIRECTIVES:  not in place    HEALTH MAINTENANCE: Social History   Tobacco Use   Smoking status: Never   Smokeless tobacco: Never  Substance Use Topics   Alcohol use: Yes    Alcohol/week: 21.0 standard drinks     Types: 21 Cans of beer per week   Drug use: No    Comment: in the past cocaine. none since 2010.      Colonoscopy: October 2014/[facility]  PAP: status post hysterectomy   Bone density: never   Lipid panel:  Not on File  Current Outpatient Medications  Medication Sig Dispense Refill   amLODipine (NORVASC) 10 MG tablet Take 1 tablet (10 mg total) by mouth daily. 90 tablet 3   amLODipine (NORVASC) 10 MG tablet TAKE 1 TABLET (10 MG) BY MOUTH DAILY 90 tablet 3   amLODipine (NORVASC) 10 MG tablet TAKE 1 TABLET (10 MG TOTAL) BY MOUTH DAILY. 90 tablet 3   anastrozole (ARIMIDEX) 1 MG tablet Take 1 tablet (1 mg total) by mouth daily. 90 tablet 0   atorvastatin (LIPITOR) 40 MG tablet Take 1 tablet (40 mg total) by mouth daily. 90 tablet 3   atorvastatin (LIPITOR) 40 MG tablet TAKE 1 TABLET (40 MG) BY MOUTH DAILY AT BEDTIME 90 tablet 3   atorvastatin (LIPITOR) 40 MG tablet TAKE 1 TABLET (40 MG TOTAL) BY MOUTH DAILY. 90 tablet 3   Calcium Carb-Cholecalciferol (CALCIUM 500+D HIGH POTENCY) 500-400 MG-UNIT TABS Take 2 tablets by mouth daily. 180 tablet 3   gabapentin (NEURONTIN) 300 MG capsule Take 1 capsule (300 mg total) by mouth daily. 90 capsule 3  hydrochlorothiazide (HYDRODIURIL) 25 MG tablet Take 1 tablet (25 mg total) by mouth daily. 90 tablet 1   hydrochlorothiazide (HYDRODIURIL) 25 MG tablet TAKE 1 TABLET (25 MG) BY MOUTH ONCE A DAILY 90 tablet 3   hydrochlorothiazide (HYDRODIURIL) 25 MG tablet TAKE 1 TABLET (25 MG TOTAL) BY MOUTH DAILY. 90 tablet 1   Melatonin 5 MG CAPS Take 1-2 capsules (5-10 mg total) by mouth as needed. 60 capsule 0   Vitamin D, Ergocalciferol, (DRISDOL) 1.25 MG (50000 UT) CAPS capsule Take 50,000 Units by mouth every 7 (seven) days.     No current facility-administered medications for this visit.    OBJECTIVE: Middle-aged African-American woman who appears well  Vitals:   05/26/21 1334  BP: (!) 144/84  Pulse: 79  Resp: 18  Temp: (!) 97.4 F (36.3 C)  SpO2: 97%       Body mass index is 28.41 kg/m.    ECOG FS:0 - Asymptomatic  GENERAL: Patient is a well appearing female in no acute distress HEENT:  Sclerae anicteric.  Mask in place. Neck is supple.  NODES:  No cervical, supraclavicular, or axillary lymphadenopathy palpated.  BREAST EXAM:  S/p left breast lumpectomy and radiation, no sign of local recurrence, right breast benign LUNGS:  Clear to auscultation bilaterally.  No wheezes or rhonchi. HEART:  Regular rate and rhythm. No murmur appreciated. ABDOMEN:  Soft, nontender.  Positive, normoactive bowel sounds. No organomegaly palpated. MSK:  No focal spinal tenderness to palpation. Full range of motion bilaterally in the upper extremities. EXTREMITIES:  No peripheral edema.   SKIN:  Clear with no obvious rashes or skin changes. No nail dyscrasia. NEURO:  Nonfocal. Well oriented.  Appropriate affect.     LAB RESULTS:  CMP     Component Value Date/Time   NA 139 05/05/2020 1321   NA 140 04/19/2018 0904   NA 139 02/28/2017 1250   K 3.4 (L) 05/05/2020 1321   K 3.5 02/28/2017 1250   CL 103 05/05/2020 1321   CO2 27 05/05/2020 1321   CO2 27 02/28/2017 1250   GLUCOSE 87 05/05/2020 1321   GLUCOSE 96 02/28/2017 1250   BUN 14 05/05/2020 1321   BUN 21 04/19/2018 0904   BUN 15.0 02/28/2017 1250   CREATININE 0.91 05/05/2020 1321   CREATININE 0.8 02/28/2017 1250   CALCIUM 9.5 05/05/2020 1321   CALCIUM 10.1 02/28/2017 1250   PROT 7.8 05/05/2020 1321   PROT 7.6 04/19/2018 0904   PROT 7.8 02/28/2017 1250   ALBUMIN 4.1 05/05/2020 1321   ALBUMIN 4.7 04/19/2018 0904   ALBUMIN 4.1 02/28/2017 1250   AST 23 05/05/2020 1321   AST 19 02/28/2017 1250   ALT 23 05/05/2020 1321   ALT 15 02/28/2017 1250   ALKPHOS 48 05/05/2020 1321   ALKPHOS 64 02/28/2017 1250   BILITOT 0.6 05/05/2020 1321   BILITOT 0.77 02/28/2017 1250   GFRNONAA >60 05/05/2020 1321   GFRNONAA >89 04/16/2014 1141   GFRAA >60 10/30/2019 1311   GFRAA >89 04/16/2014 1141    INo  results found for: SPEP, UPEP  Lab Results  Component Value Date   WBC 7.1 05/05/2020   NEUTROABS 4.3 05/05/2020   HGB 12.9 05/05/2020   HCT 35.5 (L) 05/05/2020   MCV 79.4 (L) 05/05/2020   PLT 308 05/05/2020      Chemistry      Component Value Date/Time   NA 139 05/05/2020 1321   NA 140 04/19/2018 0904   NA 139 02/28/2017 1250   K  3.4 (L) 05/05/2020 1321   K 3.5 02/28/2017 1250   CL 103 05/05/2020 1321   CO2 27 05/05/2020 1321   CO2 27 02/28/2017 1250   BUN 14 05/05/2020 1321   BUN 21 04/19/2018 0904   BUN 15.0 02/28/2017 1250   CREATININE 0.91 05/05/2020 1321   CREATININE 0.8 02/28/2017 1250      Component Value Date/Time   CALCIUM 9.5 05/05/2020 1321   CALCIUM 10.1 02/28/2017 1250   ALKPHOS 48 05/05/2020 1321   ALKPHOS 64 02/28/2017 1250   AST 23 05/05/2020 1321   AST 19 02/28/2017 1250   ALT 23 05/05/2020 1321   ALT 15 02/28/2017 1250   BILITOT 0.6 05/05/2020 1321   BILITOT 0.77 02/28/2017 1250       No results found for: LABCA2  No components found for: LABCA125  No results for input(s): INR in the last 168 hours.  Urinalysis    Component Value Date/Time   COLORURINE YELLOW 10/06/2019 2224   APPEARANCEUR CLEAR 10/06/2019 2224   LABSPEC 1.013 10/06/2019 2224   PHURINE 6.0 10/06/2019 2224   GLUCOSEU NEGATIVE 10/06/2019 2224   HGBUR MODERATE (A) 10/06/2019 2224   BILIRUBINUR NEGATIVE 10/06/2019 2224   BILIRUBINUR negative 04/19/2018 0849   KETONESUR NEGATIVE 10/06/2019 2224   PROTEINUR NEGATIVE 10/06/2019 2224   UROBILINOGEN 0.2 04/19/2018 0849   UROBILINOGEN 1.0 03/06/2009 1804   NITRITE NEGATIVE 10/06/2019 2224   LEUKOCYTESUR MODERATE (A) 10/06/2019 2224    STUDIES: CLINICAL DATA:  70 year old female with history of left breast cancer in 2016 status post lumpectomy and radiation. Patient presents for annual exam. No new problems.   EXAM: DIGITAL DIAGNOSTIC BILATERAL MAMMOGRAM WITH TOMO AND CAD   COMPARISON:  Previous exam(s).   ACR  Breast Density Category c: The breast tissue is heterogeneously dense, which may obscure small masses.   FINDINGS: Right breast: No suspicious mass, distortion, or microcalcifications are identified to suggest presence of malignancy.   Left breast: Spot 2D magnification views of the lumpectomy site were performed in addition to standard views. There are stable postsurgical changes. No suspicious mass, distortion, or microcalcifications are identified to suggest presence of malignancy.   Mammographic images were processed with CAD.   IMPRESSION: Benign postsurgical changes in the left breast. No mammographic evidence of malignancy bilaterally.   RECOMMENDATION: Screening mammogram in one year.(Code:SM-B-01Y)   I have discussed the findings and recommendations with the patient. If applicable, a reminder letter will be sent to the patient regarding the next appointment.   BI-RADS CATEGORY  2: Benign.     Electronically Signed   By: Audie Pinto M.D.   On: 04/30/2020 12:00    ASSESSMENT: 70 y.o. [place] woman status post left breast biopsy 01/21/2015 for a clinical T1b N0, stage IA invasive ductal carcinoma, grade 1 or 2, strongly estrogen receptor positive, progesterone receptor negative, with no HER-2 amplification and an MIB-1 of 20%  (1) left lumpectomy and sentinel lymph node sampling 02/06/2015 showed a pT1c pN0, stage IA invasive ductal carcinoma, grade 2, with negative margins   (2) OncotypeDX score of 29 falls in the intermediate range and predicts a 10 year risk of outside the breast recurrence of 19% if the patient's only systemic therapy is tamoxifen for 5 years   (a) patient declined adjuvant chemotherapy  (3) adjuvant radiation 03/19/2015-05/02/2015: Left breast 50.4 Gy in 28 fx's; lumpectomy cavity boost 10 Gy in 5 fx's  (4) on anastrozole as of November 2016-04/2020  (a) bone density 03/10/2016 showed a T  score of -2.2.  (b) bone density on 04/09/2019  shows a T score of -2.5.     PLAN: Marissa Williams is doing quite well today.  She has no clinical or radiographic sign of breast cancer recurrence.  She will continue to undergo annual mammograms.  Her next mammogram is due in December 2023.  Marissa Williams is up-to-date with her cancer screening.  I discussed healthy diet and exercise with her.  She has a plan that we will start at the beginning of January.  She will continue to see her primary care provider regularly.  Marissa Williams will return once a year for long-term follow-up.  She knows to call for any questions or concerns that may arise between now and her next appointment.   Total encounter time: 20 minutes*in face-to-face visit time, chart review, lab review, care coordination, and documentation of the encounter.  Wilber Bihari, NP 05/26/21 1:40 PM Medical Oncology and Hematology [facility] [place], [place] [address] Tel. [phone]    Fax. [phone]   *Total Encounter Time as defined by the Centers for Medicare and Medicaid Services includes, in addition to the face-to-face time of a patient visit (documented in the note above) non-face-to-face time: obtaining and reviewing outside history, ordering and reviewing medications, tests or procedures, care coordination (communications with other health care professionals or caregivers) and documentation in the medical record.

## 2021-07-31 ENCOUNTER — Other Ambulatory Visit (HOSPITAL_COMMUNITY): Payer: Self-pay

## 2021-07-31 MED ORDER — ATORVASTATIN CALCIUM 40 MG PO TABS
40.0000 mg | ORAL_TABLET | Freq: Every evening | ORAL | 3 refills | Status: DC
  Filled 2021-07-31: qty 90, 90d supply, fill #0

## 2021-07-31 MED ORDER — MELATONIN 5 MG PO CAPS: 5.0000 mg | ORAL_CAPSULE | ORAL | 11 refills | Status: DC | PRN

## 2021-10-05 ENCOUNTER — Encounter: Payer: Self-pay | Admitting: Adult Health

## 2021-10-05 ENCOUNTER — Emergency Department (HOSPITAL_COMMUNITY)
Admission: EM | Admit: 2021-10-05 | Discharge: 2021-10-05 | Discharge status: Against Medical Advice | Payer: Medicare PPO | Attending: Emergency Medicine | Admitting: Emergency Medicine

## 2021-10-05 ENCOUNTER — Encounter (HOSPITAL_COMMUNITY): Payer: Self-pay | Admitting: Emergency Medicine

## 2021-10-05 ENCOUNTER — Other Ambulatory Visit: Payer: Self-pay

## 2021-10-05 DIAGNOSIS — R200 Anesthesia of skin: Secondary | ICD-10-CM | POA: Insufficient documentation

## 2021-10-05 DIAGNOSIS — Z5321 Procedure and treatment not carried out due to patient leaving prior to being seen by health care provider: Secondary | ICD-10-CM | POA: Diagnosis not present

## 2021-10-05 LAB — CBC WITH DIFFERENTIAL/PLATELET
Abs Immature Granulocytes: 0.01 10*3/uL (ref 0.00–0.07)
Basophils Absolute: 0.1 10*3/uL (ref 0.0–0.1)
Basophils Relative: 1 %
Eosinophils Absolute: 0.2 10*3/uL (ref 0.0–0.5)
Eosinophils Relative: 2 %
HCT: 38.7 % (ref 36.0–46.0)
Hemoglobin: 13.8 g/dL (ref 12.0–15.0)
Immature Granulocytes: 0 %
Lymphocytes Relative: 39 %
Lymphs Abs: 3.4 10*3/uL (ref 0.7–4.0)
MCH: 28.6 pg (ref 26.0–34.0)
MCHC: 35.7 g/dL (ref 30.0–36.0)
MCV: 80.1 fL (ref 80.0–100.0)
Monocytes Absolute: 0.8 10*3/uL (ref 0.1–1.0)
Monocytes Relative: 9 %
Neutro Abs: 4.3 10*3/uL (ref 1.7–7.7)
Neutrophils Relative %: 49 %
Platelets: 337 10*3/uL (ref 150–400)
RBC: 4.83 MIL/uL (ref 3.87–5.11)
RDW: 13.9 % (ref 11.5–15.5)
WBC: 8.8 10*3/uL (ref 4.0–10.5)
nRBC: 0 % (ref 0.0–0.2)

## 2021-10-05 LAB — COMPREHENSIVE METABOLIC PANEL
ALT: 36 U/L (ref 0–44)
AST: 37 U/L (ref 15–41)
Albumin: 4.3 g/dL (ref 3.5–5.0)
Alkaline Phosphatase: 68 U/L (ref 38–126)
Anion gap: 10 (ref 5–15)
BUN: 12 mg/dL (ref 8–23)
CO2: 26 mmol/L (ref 22–32)
Calcium: 9.6 mg/dL (ref 8.9–10.3)
Chloride: 102 mmol/L (ref 98–111)
Creatinine, Ser: 0.79 mg/dL (ref 0.44–1.00)
GFR, Estimated: >60 mL/min (ref >60)
Glucose, Bld: 108 mg/dL — ABNORMAL HIGH (ref 70–99)
Potassium: 3.3 mmol/L — ABNORMAL LOW (ref 3.5–5.1)
Sodium: 138 mmol/L (ref 135–145)
Total Bilirubin: 0.3 mg/dL (ref 0.3–1.2)
Total Protein: 8.2 g/dL — ABNORMAL HIGH (ref 6.5–8.1)

## 2021-10-05 LAB — CBG MONITORING, ED: Glucose-Capillary: 92 mg/dL (ref 70–99)

## 2021-10-05 NOTE — ED Provider Triage Note (Signed)
Emergency Medicine Provider Triage Evaluation Note ? ?Marissa Williams , a 71 y.o. female  was evaluated in triage.  Pt complains of "weird feeling," to my left leg.  Patient reports that 1 hour prior while sitting in bed with her legs elevated she started having a weird feeling to her left leg.  Patient was concerned that she might have blood flow issues to her left leg.  Patient is able to stand and ambulate since this she started. ? ?Denies any numbness, weakness, facial asymmetry, dysarthria, visual disturbance, color change, pallor, wound. ? ?Review of Systems  ?Positive: See above ?Negative: See above ? ?Physical Exam  ?BP (!) 159/85   Pulse 92   Temp 97.9 ?F (36.6 ?C) (Oral)   Resp 16   SpO2 99%  ?Gen:   Awake, no distress   ?Resp:  Normal effort  ?MSK:   Moves extremities without difficulty  ?Other:  +2 DP pulse bilaterally.  Sensation intact to all digits of bilateral feet.  CN II through XII intact.  +5 strength to bilateral upper and lower extremities.   ? ?Medical Decision Making  ?Medically screening exam initiated at 6:53 PM.  Appropriate orders placed.  Marissa Williams was informed that the remainder of the evaluation will be completed by another provider, this initial triage assessment does not replace that evaluation, and the importance of remaining in the ED until their evaluation is complete. ? ? ?  ?Loni Beckwith, PA-C ?10/05/21 1855 ? ?

## 2021-10-05 NOTE — ED Triage Notes (Signed)
Patient coming from home, complaint of weird feeling/ numbness in left leg while laying in bed, states put her legs up and does not normally do that. VSS. NAD. No deficits at this time.  ?

## 2022-03-08 ENCOUNTER — Other Ambulatory Visit (HOSPITAL_COMMUNITY): Payer: Self-pay

## 2022-03-08 MED ORDER — ZOLPIDEM TARTRATE 5 MG PO TABS
5.0000 mg | ORAL_TABLET | Freq: Every day | ORAL | 1 refills | Status: DC | PRN
  Filled 2022-03-08 – 2022-04-07 (×2): qty 15, 15d supply, fill #0

## 2022-03-16 ENCOUNTER — Other Ambulatory Visit (HOSPITAL_COMMUNITY): Payer: Self-pay

## 2022-04-07 ENCOUNTER — Other Ambulatory Visit (HOSPITAL_COMMUNITY): Payer: Self-pay

## 2022-05-12 ENCOUNTER — Other Ambulatory Visit (HOSPITAL_COMMUNITY): Payer: Self-pay

## 2022-05-12 MED ORDER — ZOLPIDEM TARTRATE 5 MG PO TABS
5.0000 mg | ORAL_TABLET | Freq: Every day | ORAL | 1 refills | Status: DC
  Filled 2022-05-12: qty 15, 15d supply, fill #0

## 2022-05-13 ENCOUNTER — Other Ambulatory Visit (HOSPITAL_COMMUNITY): Payer: Self-pay

## 2022-05-27 ENCOUNTER — Encounter: Payer: Self-pay | Admitting: Adult Health

## 2022-05-29 ENCOUNTER — Encounter: Payer: Self-pay | Admitting: Adult Health

## 2022-06-16 ENCOUNTER — Other Ambulatory Visit (HOSPITAL_COMMUNITY): Payer: Self-pay

## 2022-06-16 MED ORDER — ATORVASTATIN CALCIUM 40 MG PO TABS
40.0000 mg | ORAL_TABLET | Freq: Every day | ORAL | 3 refills | Status: DC
  Filled 2022-06-16: qty 90, 90d supply, fill #0

## 2022-07-27 ENCOUNTER — Other Ambulatory Visit (HOSPITAL_COMMUNITY): Payer: Self-pay

## 2022-07-27 MED ORDER — KETOROLAC TROMETHAMINE 0.5 % OP SOLN
1.0000 [drp] | Freq: Four times a day (QID) | OPHTHALMIC | 1 refills | Status: DC
  Filled 2022-07-27: qty 10, 38d supply, fill #0
  Filled 2022-09-22: qty 10, 50d supply, fill #1
  Filled 2022-09-23: qty 10, 15d supply, fill #1

## 2022-07-27 MED ORDER — PREDNISOLONE ACETATE 1 % OP SUSP
1.0000 [drp] | Freq: Four times a day (QID) | OPHTHALMIC | 1 refills | Status: DC
  Filled 2022-07-27: qty 5, 19d supply, fill #0

## 2022-07-27 MED ORDER — GATIFLOXACIN 0.5 % OP SOLN
1.0000 [drp] | Freq: Four times a day (QID) | OPHTHALMIC | 1 refills | Status: DC
  Filled 2022-07-27 – 2022-07-28 (×2): qty 2.5, 10d supply, fill #0

## 2022-07-28 ENCOUNTER — Other Ambulatory Visit (HOSPITAL_COMMUNITY): Payer: Self-pay

## 2022-08-04 ENCOUNTER — Other Ambulatory Visit (HOSPITAL_COMMUNITY): Payer: Self-pay

## 2022-09-22 ENCOUNTER — Other Ambulatory Visit (HOSPITAL_COMMUNITY): Payer: Self-pay

## 2022-09-23 ENCOUNTER — Other Ambulatory Visit (HOSPITAL_COMMUNITY): Payer: Self-pay

## 2022-10-25 ENCOUNTER — Other Ambulatory Visit (HOSPITAL_COMMUNITY): Payer: Self-pay

## 2022-10-25 MED ORDER — DICLOFENAC SODIUM 1 % EX GEL
2.0000 g | Freq: Two times a day (BID) | CUTANEOUS | 1 refills | Status: AC
  Filled 2022-10-25: qty 100, 30d supply, fill #0

## 2022-10-25 MED ORDER — ACETAMINOPHEN ER 650 MG PO TBCR: 650.0000 mg | EXTENDED_RELEASE_TABLET | Freq: Three times a day (TID) | ORAL | 3 refills | Status: AC | PRN

## 2022-11-01 ENCOUNTER — Other Ambulatory Visit (HOSPITAL_COMMUNITY): Payer: Self-pay

## 2022-11-01 MED ORDER — MELOXICAM 15 MG PO TABS
15.0000 mg | ORAL_TABLET | Freq: Every day | ORAL | 1 refills | Status: DC
  Filled 2022-11-01: qty 30, 30d supply, fill #0

## 2023-01-23 ENCOUNTER — Other Ambulatory Visit (HOSPITAL_COMMUNITY): Payer: Self-pay

## 2023-01-23 ENCOUNTER — Emergency Department (HOSPITAL_COMMUNITY): Payer: Medicare PPO

## 2023-01-23 ENCOUNTER — Other Ambulatory Visit: Payer: Self-pay

## 2023-01-23 ENCOUNTER — Emergency Department (HOSPITAL_COMMUNITY)
Admission: EM | Admit: 2023-01-23 | Discharge: 2023-01-23 | Disposition: A | Discharge status: Home / Self Care | Payer: Medicare PPO | Attending: Emergency Medicine | Admitting: Emergency Medicine

## 2023-01-23 ENCOUNTER — Encounter (HOSPITAL_COMMUNITY): Payer: Self-pay

## 2023-01-23 DIAGNOSIS — R519 Headache, unspecified: Secondary | ICD-10-CM | POA: Diagnosis not present

## 2023-01-23 DIAGNOSIS — R55 Syncope and collapse: Secondary | ICD-10-CM | POA: Diagnosis not present

## 2023-01-23 DIAGNOSIS — E876 Hypokalemia: Secondary | ICD-10-CM | POA: Diagnosis not present

## 2023-01-23 DIAGNOSIS — Z853 Personal history of malignant neoplasm of breast: Secondary | ICD-10-CM | POA: Diagnosis not present

## 2023-01-23 DIAGNOSIS — I10 Essential (primary) hypertension: Secondary | ICD-10-CM | POA: Insufficient documentation

## 2023-01-23 DIAGNOSIS — R0789 Other chest pain: Secondary | ICD-10-CM | POA: Diagnosis not present

## 2023-01-23 DIAGNOSIS — Y900 Blood alcohol level of less than 20 mg/100 ml: Secondary | ICD-10-CM | POA: Diagnosis not present

## 2023-01-23 DIAGNOSIS — Z79899 Other long term (current) drug therapy: Secondary | ICD-10-CM | POA: Insufficient documentation

## 2023-01-23 LAB — CBC WITH DIFFERENTIAL/PLATELET
Abs Immature Granulocytes: 0.02 10*3/uL (ref 0.00–0.07)
Basophils Absolute: 0 10*3/uL (ref 0.0–0.1)
Basophils Relative: 0 %
Eosinophils Absolute: 0 10*3/uL (ref 0.0–0.5)
Eosinophils Relative: 0 %
HCT: 37.2 % (ref 36.0–46.0)
Hemoglobin: 13.5 g/dL (ref 12.0–15.0)
Immature Granulocytes: 0 %
Lymphocytes Relative: 27 %
Lymphs Abs: 2.4 10*3/uL (ref 0.7–4.0)
MCH: 29.7 pg (ref 26.0–34.0)
MCHC: 36.3 g/dL — ABNORMAL HIGH (ref 30.0–36.0)
MCV: 81.9 fL (ref 80.0–100.0)
Monocytes Absolute: 0.6 10*3/uL (ref 0.1–1.0)
Monocytes Relative: 6 %
Neutro Abs: 5.9 10*3/uL (ref 1.7–7.7)
Neutrophils Relative %: 67 %
Platelets: 320 10*3/uL (ref 150–400)
RBC: 4.54 MIL/uL (ref 3.87–5.11)
RDW: 13.8 % (ref 11.5–15.5)
WBC: 9 10*3/uL (ref 4.0–10.5)
nRBC: 0 % (ref 0.0–0.2)

## 2023-01-23 LAB — COMPREHENSIVE METABOLIC PANEL
ALT: 27 U/L (ref 0–44)
AST: 35 U/L (ref 15–41)
Albumin: 4.3 g/dL (ref 3.5–5.0)
Alkaline Phosphatase: 48 U/L (ref 38–126)
Anion gap: 14 (ref 5–15)
BUN: 9 mg/dL (ref 8–23)
CO2: 26 mmol/L (ref 22–32)
Calcium: 9.6 mg/dL (ref 8.9–10.3)
Chloride: 97 mmol/L — ABNORMAL LOW (ref 98–111)
Creatinine, Ser: 0.77 mg/dL (ref 0.44–1.00)
GFR, Estimated: >60 mL/min (ref >60)
Glucose, Bld: 108 mg/dL — ABNORMAL HIGH (ref 70–99)
Potassium: 3.2 mmol/L — ABNORMAL LOW (ref 3.5–5.1)
Sodium: 137 mmol/L (ref 135–145)
Total Bilirubin: 0.7 mg/dL (ref 0.3–1.2)
Total Protein: 7.9 g/dL (ref 6.5–8.1)

## 2023-01-23 LAB — TROPONIN I (HIGH SENSITIVITY)
Troponin I (High Sensitivity): 4 ng/L (ref <18)
Troponin I (High Sensitivity): 5 ng/L (ref <18)

## 2023-01-23 LAB — I-STAT CHEM 8, ED
BUN: 9 mg/dL (ref 8–23)
Calcium, Ion: 1.12 mmol/L — ABNORMAL LOW (ref 1.15–1.40)
Chloride: 100 mmol/L (ref 98–111)
Creatinine, Ser: 0.7 mg/dL (ref 0.44–1.00)
Glucose, Bld: 108 mg/dL — ABNORMAL HIGH (ref 70–99)
HCT: 41 % (ref 36.0–46.0)
Hemoglobin: 13.9 g/dL (ref 12.0–15.0)
Potassium: 3.1 mmol/L — ABNORMAL LOW (ref 3.5–5.1)
Sodium: 138 mmol/L (ref 135–145)
TCO2: 26 mmol/L (ref 22–32)

## 2023-01-23 LAB — APTT: aPTT: 32 seconds (ref 24–36)

## 2023-01-23 LAB — PROTIME-INR
INR: 1 (ref 0.8–1.2)
Prothrombin Time: 12.9 seconds (ref 11.4–15.2)

## 2023-01-23 LAB — CBG MONITORING, ED: Glucose-Capillary: 100 mg/dL — ABNORMAL HIGH (ref 70–99)

## 2023-01-23 LAB — ETHANOL: Alcohol, Ethyl (B): <10 mg/dL (ref <10)

## 2023-01-23 MED ORDER — SODIUM CHLORIDE 0.9% FLUSH: 3.0000 mL | Freq: Once | INTRAVENOUS | Status: DC

## 2023-01-23 MED ORDER — POTASSIUM CHLORIDE CRYS ER 20 MEQ PO TBCR
20.0000 meq | EXTENDED_RELEASE_TABLET | Freq: Every day | ORAL | 0 refills | Status: AC
Start: 2023-01-23 — End: ?
  Filled 2023-01-23: qty 3, 3d supply, fill #0

## 2023-01-23 MED ORDER — POTASSIUM CHLORIDE CRYS ER 20 MEQ PO TBCR
40.0000 meq | EXTENDED_RELEASE_TABLET | Freq: Once | ORAL | Status: AC
  Administered 2023-01-23: 40 meq via ORAL
  Filled 2023-01-23: qty 2

## 2023-01-23 MED ORDER — SODIUM CHLORIDE 0.9 % IV BOLUS
1000.0000 mL | Freq: Once | INTRAVENOUS | Status: AC
  Administered 2023-01-23: 1000 mL via INTRAVENOUS

## 2023-01-23 NOTE — ED Provider Notes (Signed)
[place] EMERGENCY DEPARTMENT AT [facility] Provider Note   CSN: [id number] Arrival date & time: 01/23/23  1029     History  Chief Complaint  Patient presents with   Headache    Marissa Williams is a 73 y.o. female.  The history is provided by the patient and medical records. No language interpreter was used.  Headache    72 year old female significant history of hypertension, breast cancer, presenting with complaints of headache and dizziness.  Patient report this morning when she woke up she endorsed feeling a bit lightheadedness and described more as feeling a bit unsteady on her feet.  Symptom is improving but not fully resolved.  She does not endorse any room spinning sensation.  She felt normal last night but did take some trazodone to help her with sleep.  She took her normal medication this morning but due to her symptoms she decided to come to ER for evaluation.  She endorses slight headache described more as tension to her temples. she does not endorse any fever chills runny nose sneezing coughing neck pain chest pain trouble breathing abdominal pain back pain nausea vomiting diarrhea dysuria focal numbness or focal weakness.  No history of stroke.  No recent medication changes.  She endorsed occasional indigestion sensation in her left chest for the past week and have occasional chest cramping but denies any exertional chest pain or cough.  Home Medications Prior to Admission medications   Medication Sig Start Date End Date Taking? Authorizing Provider  acetaminophen (TYLENOL 8 HOUR) 650 MG CR tablet Take 1 tablet (650 mg total) by mouth every 8 (eight) hours as needed. Not to exceed 3 tablets in 24 hours. 10/25/22   Karenann Cai, NP  amLODipine (NORVASC) 10 MG tablet Take 1 tablet (10 mg total) by mouth daily. 03/25/20 03/25/21  Bloomfield, Carley D, DO  amLODipine (NORVASC) 10 MG tablet TAKE 1 TABLET (10 MG) BY MOUTH DAILY 08/01/20 08/01/21  Hillery Aldo,  NP  amLODipine (NORVASC) 10 MG tablet TAKE 1 TABLET (10 MG TOTAL) BY MOUTH DAILY. 03/25/20 03/25/21  Bloomfield, Karma Ganja D, DO  anastrozole (ARIMIDEX) 1 MG tablet Take 1 tablet (1 mg total) by mouth daily. 04/21/20   Magrinat, Valentino Hue, MD  atorvastatin (LIPITOR) 40 MG tablet Take 1 tablet (40 mg total) by mouth daily. 02/12/20   Gardenia Phlegm, MD  atorvastatin (LIPITOR) 40 MG tablet Take 1 tablet (40 mg total) by mouth at bedtime. 07/31/21     atorvastatin (LIPITOR) 40 MG tablet Take 1 tablet (40 mg total) by mouth at bedtime. 06/16/22     atorvastatin (LIPITOR) 40 MG tablet TAKE 1 TABLET (40 MG) BY MOUTH DAILY AT BEDTIME 08/01/20 08/01/21  Hillery Aldo, NP  atorvastatin (LIPITOR) 40 MG tablet TAKE 1 TABLET (40 MG TOTAL) BY MOUTH DAILY. 02/12/20 02/11/21  Gardenia Phlegm, MD  Calcium Carb-Cholecalciferol (CALCIUM 500+D HIGH POTENCY) 500-400 MG-UNIT TABS Take 2 tablets by mouth daily. 12/17/20     diclofenac Sodium (GOODSENSE ARTHRITIS PAIN) 1 % GEL Apply 2 grams to left knee, twice a day as needed for joint pain. 10/25/22     gabapentin (NEURONTIN) 300 MG capsule Take 1 capsule (300 mg total) by mouth daily. 12/17/20     gatifloxacin (ZYMAXID) 0.5 % SOLN Place 1 drop into the right eye 4 (four) times daily as directed 07/27/22   Mia Creek, MD  hydrochlorothiazide (HYDRODIURIL) 25 MG tablet Take 1 tablet (25 mg total) by mouth daily. 05/15/20  Alphonzo Severance, MD  hydrochlorothiazide (HYDRODIURIL) 25 MG tablet TAKE 1 TABLET (25 MG) BY MOUTH ONCE A DAILY 08/01/20 08/01/21  Hillery Aldo, NP  hydrochlorothiazide (HYDRODIURIL) 25 MG tablet TAKE 1 TABLET (25 MG TOTAL) BY MOUTH DAILY. 05/15/20 05/15/21  Alphonzo Severance, MD  ketorolac (ACULAR) 0.5 % ophthalmic solution Place 1 drop into the right eye 4 (four) times daily as directed 07/27/22   Mia Creek, MD  Melatonin 5 MG CAPS Take 1-2 capsules (5-10 mg total) by mouth as needed. 02/23/21     meloxicam (MOBIC) 15 MG tablet Take 1 tablet (15 mg total) by mouth daily  with a meal/food 11/01/22     prednisoLONE acetate (PRED FORTE) 1 % ophthalmic suspension Place 1 drop into the right eye 4 (four) times daily as directed 07/27/22   Mia Creek, MD  Vitamin D, Ergocalciferol, (DRISDOL) 1.25 MG (50000 UT) CAPS capsule Take 50,000 Units by mouth every 7 (seven) days.    [provider]  zolpidem (AMBIEN) 5 MG tablet Take 1 tablet (5 mg total) by mouth daily as needed for insomnia 03/08/22     zolpidem (AMBIEN) 5 MG tablet Take 1 tablet (5 mg total) by mouth at bedtime as needed for insomnia 05/12/22         Allergies    Patient has no known allergies.    Review of Systems   Review of Systems  Neurological:  Positive for headaches.  All other systems reviewed and are negative.   Physical Exam Updated Vital Signs BP (!) 153/93 (BP Location: Right Arm)   Pulse 96   Temp 98.1 F (36.7 C) (Oral)   Resp 18   Ht 5\' 3"  (1.6 m)   Wt 72.8 kg   SpO2 100%   BMI 28.43 kg/m  Physical Exam Vitals and nursing note reviewed.  Constitutional:      General: She is not in acute distress.    Appearance: She is well-developed.  HENT:     Head: Normocephalic and atraumatic.     Right Ear: Tympanic membrane normal.     Left Ear: Tympanic membrane normal.  Eyes:     Extraocular Movements: Extraocular movements intact.     Conjunctiva/sclera: Conjunctivae normal.     Pupils: Pupils are equal, round, and reactive to light.  Cardiovascular:     Rate and Rhythm: Normal rate and regular rhythm.     Pulses: Normal pulses.     Heart sounds: Normal heart sounds.  Pulmonary:     Effort: Pulmonary effort is normal.  Abdominal:     Palpations: Abdomen is soft.  Musculoskeletal:     Cervical back: Normal range of motion and neck supple. No rigidity or tenderness.  Skin:    Findings: No rash.  Neurological:     Mental Status: She is alert and oriented to person, place, and time.     Comments: Neurologic exam:  Speech clear, pupils equal round reactive to  light, extraocular movements intact  Normal peripheral visual fields Cranial nerves III through XII normal including no facial droop Follows commands, moves all extremities x4, normal strength to bilateral upper and lower extremities at all major muscle groups including grip Sensation normal to light touch and pinprick Coordination intact, no limb ataxia, finger-nose-finger normal Rapid alternating movements normal No pronator drift Gait normal   Psychiatric:        Mood and Affect: Mood normal.     ED Results / Procedures / Treatments   Labs (all labs ordered are  listed, but only abnormal results are displayed) Labs Reviewed  COMPREHENSIVE METABOLIC PANEL - Abnormal; Notable for the following components:      Result Value   Potassium 3.2 (*)    Chloride 97 (*)    Glucose, Bld 108 (*)    All other components within normal limits  CBC WITH DIFFERENTIAL/PLATELET - Abnormal; Notable for the following components:   MCHC 36.3 (*)    All other components within normal limits  I-STAT CHEM 8, ED - Abnormal; Notable for the following components:   Potassium 3.1 (*)    Glucose, Bld 108 (*)    Calcium, Ion 1.12 (*)    All other components within normal limits  PROTIME-INR  APTT  ETHANOL  CBG MONITORING, ED    EKG EKG Interpretation Date/Time:  Sunday January 23 2023 10:13:32 EDT Ventricular Rate:  83 PR Interval:  146 QRS Duration:  86 QT Interval:  402 QTC Calculation: 472 R Axis:   82  Text Interpretation: Normal sinus rhythm Septal infarct , age undetermined Abnormal ECG When compared with ECG of 05-Oct-2021 18:59, PREVIOUS ECG IS PRESENT since last tracing no significant change Confirmed by Rolan Bucco [phone]) on 01/23/2023 1:16:34 PM  Radiology CT HEAD WO CONTRAST  Result Date: 01/23/2023 CLINICAL DATA:  Neuro deficit, acute, stroke suspected. Headache and dizziness upon waking today. Last known well was yesterday at 19:30. EXAM: CT HEAD WITHOUT CONTRAST TECHNIQUE:  Contiguous axial images were obtained from the base of the skull through the vertex without intravenous contrast. RADIATION DOSE REDUCTION: This exam was performed according to the departmental dose-optimization program which includes automated exposure control, adjustment of the mA and/or kV according to patient size and/or use of iterative reconstruction technique. COMPARISON:  CT head without contrast 03/06/2009 FINDINGS: Brain: No acute infarct, hemorrhage, or mass lesion is present. No significant white matter lesions are present. The ventricles are of normal size. No significant extraaxial fluid collection is present. The brainstem and cerebellum are within normal limits. Midline structures are within normal limits. Vascular: No hyperdense vessel or unexpected calcification. Skull: Calvarium is intact. No focal lytic or blastic lesions are present. No significant extracranial soft tissue lesion is present. Sinuses/Orbits: Right lens replacement is noted. Globes and orbits are otherwise within normal limits. IMPRESSION: Negative CT of the head. Electronically Signed   By: Marin Roberts M.D.   On: 01/23/2023 11:46    Procedures Procedures    Medications Ordered in ED Medications  sodium chloride flush (NS) 0.9 % injection 3 mL (3 mLs Intravenous Not Given 01/23/23 1239)  potassium chloride SA (KLOR-CON M) CR tablet 40 mEq (40 mEq Oral Given 01/23/23 1253)  sodium chloride 0.9 % bolus 1,000 mL (1,000 mLs Intravenous New Bag/Given 01/23/23 1454)    ED Course/ Medical Decision Making/ A&P                                 Medical Decision Making Amount and/or Complexity of Data Reviewed Labs: ordered. Radiology: ordered.  Risk Prescription drug management.   BP (!) 153/93 (BP Location: Right Arm)   Pulse 96   Temp 98.1 F (36.7 C) (Oral)   Resp 18   Ht 5\' 3"  (1.6 m)   Wt 72.8 kg   SpO2 100%   BMI 28.43 kg/m   14:76 PM  72 year old female significant history of hypertension,  breast cancer, presenting with complaints of headache and dizziness.  Patient report this morning  when she woke up she endorsed feeling a bit lightheadedness and described more as feeling a bit unsteady on her feet.  Symptom is improving but not fully resolved.  She does not endorse any room spinning sensation.  She felt normal last night but did take some trazodone to help her with sleep.  She took her normal medication this morning but due to her symptoms she decided to come to ER for evaluation.  She endorses slight headache described more as tension to her temples. she does not endorse any fever chills runny nose sneezing coughing neck pain chest pain trouble breathing abdominal pain back pain nausea vomiting diarrhea dysuria focal numbness or focal weakness.  No history of stroke.  No recent medication changes.  She endorsed occasional indigestion sensation in her left chest for the past week and have occasional chest cramping but denies any exertional chest pain or cough.  On exam this is a well-appearing female resting comfortably in bed appears to be in no acute discomfort.  Ear exam unremarkable no evidence of cerumen impaction ear infection.  Patient has no focal neurodeficit on exam.  She has a normal steady gait.  Heart with normal rate and rhythm, lungs clear to auscultation bilaterally abdomen is soft nontender no peripheral edema noted.  She is alert and oriented x 4.  No stroke sxs. No vertigo sxs. Pt does endorse occasional indigestion and left chest cramp.  Will also perform work up to r/o ACS.   -Labs ordered, independently viewed and interpreted by me.  Labs remarkable for K+ 3.2, supplementation given.  The remainder of her labs are reassuring -The patient was maintained on a cardiac monitor.  I personally viewed and interpreted the cardiac monitored which showed an underlying rhythm of: NSR -Imaging independently viewed and interpreted by me and I agree with radiologist's  interpretation.  Result remarkable for head CT unremarkable.  CXR reassuring.  -This patient presents to the ED for concern of lightheadedness, this involves an extensive number of treatment options, and is a complaint that carries with it a high risk of complications and morbidity.  The differential diagnosis includes cardiac arrhythmia, anemia, electrolytes derangement, hypoglycemia, dehydration, stroke, MI -Co morbidities that complicate the patient evaluation includes breast CA, HTn,  -Treatment includes potassium, IVF -Reevaluation of the patient after these medicines showed that the patient improved -PCP office notes or outside notes reviewed -Discussion with specialist attending Dr. Fredderick Phenix -Escalation to admission/observation considered: patients feels much better, is comfortable with discharge, and will follow up with PCP -Prescription medication considered, patient comfortable with potassium supplementation and outpt f/u -Social Determinant of Health considered          Final Clinical Impression(s) / ED Diagnoses Final diagnoses:  Near syncope  Hypokalemia    Rx / DC Orders ED Discharge Orders          Ordered    Ambulatory referral to Cardiology       Comments: If you have not heard from the Cardiology office within the next 72 hours please call [phone].   01/23/23 1559    potassium chloride SA (KLOR-CON M) 20 MEQ tablet  Daily        01/23/23 1608              Fayrene Helper, PA-C 01/23/23 1609    Rolan Bucco, MD 01/23/23 [phone]

## 2023-01-23 NOTE — Discharge Instructions (Addendum)
You have been evaluated for your symptoms.  Fortunately no concerning findings were noted on today's exam.  Please follow-up closely with your doctor for further care.  You may follow-up with cardiology for further evaluation of your near passing out episode.  You may take Tylenol as needed for headache.  Your potassium level is low today, take potassium supplementation as prescribed and have it rechecked by your doctor

## 2023-01-23 NOTE — ED Triage Notes (Signed)
Pt c/o HA and dizziness started at 0515 today upon waking. LKW 01/22/23 1930

## 2023-01-24 ENCOUNTER — Other Ambulatory Visit (HOSPITAL_COMMUNITY): Payer: Self-pay

## 2023-02-01 ENCOUNTER — Other Ambulatory Visit (HOSPITAL_COMMUNITY): Payer: Self-pay

## 2023-02-05 ENCOUNTER — Other Ambulatory Visit: Payer: Self-pay

## 2023-02-05 ENCOUNTER — Encounter (HOSPITAL_COMMUNITY): Payer: Self-pay | Admitting: Emergency Medicine

## 2023-02-05 ENCOUNTER — Ambulatory Visit (HOSPITAL_COMMUNITY)
Admission: EM | Admit: 2023-02-05 | Discharge: 2023-02-05 | Disposition: A | Discharge status: Home / Self Care | Payer: Medicare PPO | Source: Home / Self Care | Attending: Family Medicine | Admitting: Family Medicine

## 2023-02-05 DIAGNOSIS — R200 Anesthesia of skin: Secondary | ICD-10-CM

## 2023-02-05 NOTE — ED Triage Notes (Signed)
Pt c/o right side leg numbness that started today after she got out of work.

## 2023-02-09 ENCOUNTER — Encounter: Payer: Self-pay | Admitting: Cardiology

## 2023-02-09 ENCOUNTER — Ambulatory Visit: Payer: Medicare PPO | Attending: Cardiology | Admitting: Cardiology

## 2023-02-09 VITALS — BP 114/70 | HR 80 | Ht 63.0 in | Wt 151.4 lb

## 2023-02-09 DIAGNOSIS — I10 Essential (primary) hypertension: Secondary | ICD-10-CM

## 2023-02-09 DIAGNOSIS — I340 Nonrheumatic mitral (valve) insufficiency: Secondary | ICD-10-CM

## 2023-02-09 DIAGNOSIS — R42 Dizziness and giddiness: Secondary | ICD-10-CM | POA: Diagnosis not present

## 2023-02-09 NOTE — Patient Instructions (Signed)
Medication Instructions:  Your physician recommends that you continue on your current medications as directed. Please refer to the Current Medication list given to you today.  *If you need a refill on your cardiac medications before your next appointment, please call your pharmacy*   Lab Work: None.  If you have labs (blood work) drawn today and your tests are completely normal, you will receive your results only by: MyChart Message (if you have MyChart) OR A paper copy in the mail If you have any lab test that is abnormal or we need to change your treatment, we will call you to review the results.   Testing/Procedures: Your physician has requested that you have a coronary calcium score CT. This is a test that is used to detect your risk for blockages in your coronary arteries. The patient is cost is usually between $94-99.   Your physician has requested that you have an echocardiogram in one year. Echocardiography is a painless test that uses sound waves to create images of your heart. It provides your doctor with information about the size and shape of your heart and how well your heart's chambers and valves are working. This procedure takes approximately one hour. There are no restrictions for this procedure. Please do NOT wear cologne, perfume, aftershave, or lotions (deodorant is allowed). Please arrive 15 minutes prior to your appointment time.    Follow-Up: At [facility], you and your health needs are our priority.  As part of our continuing mission to provide you with exceptional heart care, we have created designated Provider Care Teams.  These Care Teams include your primary Cardiologist (physician) and Advanced Practice Providers (APPs -  Physician Assistants and Nurse Practitioners) who all work together to provide you with the care you need, when you need it.  We recommend signing up for the patient portal called "MyChart".  Sign up information is provided on this  After Visit Summary.  MyChart is used to connect with patients for Virtual Visits (Telemedicine).  Patients are able to view lab/test results, encounter notes, upcoming appointments, etc.  Non-urgent messages can be sent to your provider as well.   To learn more about what you can do with MyChart, go to [email or web address].    Your next appointment:   1 year(s)  Provider:   Dr. Armanda Magic, MD

## 2023-02-09 NOTE — ED Provider Notes (Signed)
[facility]   [id number] 02/05/23 Arrival Time: 1754  ASSESSMENT & PLAN:  1. Right leg numbness    No s/s worrisome for CVA. Describes mild "tingling sensation". Not getting worse. Some soreness at superior buttock. Ques nerve issue. Discussed.  Prefers OTC Tylenol as needed.  Normal neurological.  Reviewed expectations re: course of current medical issues. Questions answered. Outlined signs and symptoms indicating need for more acute intervention. Patient verbalized understanding. After Visit Summary given.  SUBJECTIVE: History from: patient. Marissa Williams is a 72 y.o. female who reports right side leg numbness described as "tingling" that started today after she got out of work. A little better since being here. Mostly over posterior upper R leg. Denies extremity weakness. Normal bowel/bladder habits.   Past Surgical History:  Procedure Laterality Date   ABDOMINAL HYSTERECTOMY  1980   uterine prolapse    BREAST LUMPECTOMY Left 2016   BREAST LUMPECTOMY WITH RADIOACTIVE SEED AND SENTINEL LYMPH NODE BIOPSY Left 02/06/2015   Procedure: LEFT BREAST LUMPECTOMY WITH RADIOACTIVE SEED AND LEFT AXILLARY SENTINEL LYMPH NODE BIOPSY;  Surgeon: Glenna Fellows, MD;  Location: [facility];  Service: General;  Laterality: Left;   TUBAL LIGATION  1970      OBJECTIVE:  Vitals:   02/05/23 1825  BP: (!) 151/78  Pulse: 91  Resp: 18  Temp: 98.3 F (36.8 C)  TempSrc: Oral  SpO2: 96%    General appearance: alert; no distress HEENT: [place]; AT Neck: supple with FROM Resp: unlabored respirations Extremities: RLE: completely normal exam of RLE with normal distal sensation and normal relexes CV: brisk extremity capillary refill of RLE; 2+ DP pulse of RLE. Skin: warm and dry; no visible rashes Neurologic: gait normal; normal sensation and strength of bilateral LE Psychological: alert and cooperative; normal mood and affect  Imaging: No results  found.    No Known Allergies  Past Medical History:  Diagnosis Date   Breast cancer of upper-inner quadrant of left female breast (HCC) 01/27/2015   Hypertension Dx 2008   Osteopenia    Personal history of radiation therapy    Radiation 03/19/15-05/02/15   left breast   Therapeutic opioid induced constipation 10/10/2019   Social History   Socioeconomic History   Marital status: Divorced    Spouse name: Not on file   Number of children: 4   Years of education: 9    Highest education level: Not on file  Occupational History   Occupation: Consulting civil engineer     Comment: GED   Occupation: Assited Living     Comment: [employer]   Tobacco Use   Smoking status: Never   Smokeless tobacco: Never  Substance and Sexual Activity   Alcohol use: Yes    Alcohol/week: 21.0 standard drinks of alcohol    Types: 21 Cans of beer per week   Drug use: Not Currently    Comment: in the past cocaine. none since 2010.    Sexual activity: Yes    Birth control/protection: Surgical  Other Topics Concern   Not on file  Social History Narrative   Had 4 children.    3 living.   One child died at 16 months, SIDs.    Lives alone.    Children in town.    Social Determinants of Health   Financial Resource Strain: Not on file  Food Insecurity: Not on file  Transportation Needs: Not on file  Physical Activity: Not on file  Stress: Not on file  Social Connections: Not on  file   Family History  Problem Relation Age of Onset   Alzheimer's disease Father    Cancer Mother        throat cancer- smoker and ETOH    Heart disease Neg Hx    Diabetes Neg Hx    Past Surgical History:  Procedure Laterality Date   ABDOMINAL HYSTERECTOMY  1980   uterine prolapse    BREAST LUMPECTOMY Left 2016   BREAST LUMPECTOMY WITH RADIOACTIVE SEED AND SENTINEL LYMPH NODE BIOPSY Left 02/06/2015   Procedure: LEFT BREAST LUMPECTOMY WITH RADIOACTIVE SEED AND LEFT AXILLARY SENTINEL LYMPH NODE BIOPSY;  Surgeon: Glenna Fellows,  MD;  Location: [facility];  Service: General;  Laterality: Left;   TUBAL LIGATION  1970       Mardella Layman, MD 02/09/23 [phone]

## 2023-02-09 NOTE — Addendum Note (Signed)
Addended by: Luellen Pucker on: 02/09/2023 09:21 AM   Modules accepted: Orders

## 2023-02-09 NOTE — Progress Notes (Signed)
Cardiology CONSULT Note    Date:  02/09/2023   ID:  Marissa, Williams [birth year], MRN [id number]  PCP:  Hillery Aldo, NP  Cardiologist:  Armanda Magic, MD   No chief complaint on file.   Patient Profile: Marissa Williams is a 72 y.o. female who is being seen today for the evaluation of lightheaded at the request of Hillery Aldo, NP.  History of Present Illness:  Marissa Williams is a 72 y.o. female who is being seen today for the evaluation lightheaded at the request of Hillery Aldo, NP.  This is a 72 year old female with a history of hypertension and breast cancer.  She has no hx of cardiac disease.  On the day of admission she felt lightheaded when she got out of bed.  There was no spinning sensation and did not feel off balance.  In the ER EKG showed NSR with septal infarct. She was found to have a K+ of 3.2 that was repleted and otherwise normal.  She saw her PCP who was not sure why the ER referred her to Cardiology.  A 2D echo was done which showed normal LVF with mild LVF, mild to moderate MR.   She tells me that she has never had Chest pain or pressure, SOB, DOE, LE edema or palpitations.  She felt fine after she got her potassium repleted and has not had any further dizziness.   Past Medical History:  Diagnosis Date   Breast cancer of upper-inner quadrant of left female breast (HCC) 01/27/2015   Hypertension Dx 2008   Osteopenia    Personal history of radiation therapy    Radiation 03/19/15-05/02/15   left breast   Therapeutic opioid induced constipation 10/10/2019    Past Surgical History:  Procedure Laterality Date   ABDOMINAL HYSTERECTOMY  1980   uterine prolapse    BREAST LUMPECTOMY Left 2016   BREAST LUMPECTOMY WITH RADIOACTIVE SEED AND SENTINEL LYMPH NODE BIOPSY Left 02/06/2015   Procedure: LEFT BREAST LUMPECTOMY WITH RADIOACTIVE SEED AND LEFT AXILLARY SENTINEL LYMPH NODE BIOPSY;  Surgeon: Glenna Fellows, MD;  Location: [facility];   Service: General;  Laterality: Left;   TUBAL LIGATION  1970    Current Medications: Current Meds  Medication Sig   acetaminophen (TYLENOL 8 HOUR) 650 MG CR tablet Take 1 tablet (650 mg total) by mouth every 8 (eight) hours as needed. Not to exceed 3 tablets in 24 hours.   atorvastatin (LIPITOR) 40 MG tablet Take 1 tablet (40 mg total) by mouth daily.   Calcium Carb-Cholecalciferol (CALCIUM 500+D HIGH POTENCY) 500-400 MG-UNIT TABS Take 2 tablets by mouth daily.   diclofenac Sodium (GOODSENSE ARTHRITIS PAIN) 1 % GEL Apply 2 grams to left knee, twice a day as needed for joint pain.   hydrochlorothiazide (HYDRODIURIL) 25 MG tablet Take 1 tablet (25 mg total) by mouth daily.   zolpidem (AMBIEN) 5 MG tablet Take 1 tablet (5 mg total) by mouth daily as needed for insomnia    Allergies:   Patient has no known allergies.   Social History   Socioeconomic History   Marital status: Divorced    Spouse name: Not on file   Number of children: 4   Years of education: 9    Highest education level: Not on file  Occupational History   Occupation: Student     Comment: GED   Occupation: Assited Living     Comment: [employer]   Tobacco Use   Smoking status:  Never   Smokeless tobacco: Never  Substance and Sexual Activity   Alcohol use: Yes    Alcohol/week: 21.0 standard drinks of alcohol    Types: 21 Cans of beer per week   Drug use: Not Currently    Comment: in the past cocaine. none since 2010.    Sexual activity: Yes    Birth control/protection: Surgical  Other Topics Concern   Not on file  Social History Narrative   Had 4 children.    3 living.   One child died at 97 months, SIDs.    Lives alone.    Children in town.    Social Determinants of Health   Financial Resource Strain: Not on file  Food Insecurity: Not on file  Transportation Needs: Not on file  Physical Activity: Not on file  Stress: Not on file  Social Connections: Not on file     Family History:  The patient's  family history includes Alzheimer's disease in her father; Cancer in her mother.   ROS:   Please see the history of present illness.    ROS All other systems reviewed and are negative.      No data to display             PHYSICAL EXAM:   VS:  BP 114/70 (BP Location: Right Arm, Patient Position: Sitting, Cuff Size: Normal)   Pulse 80   Ht 5\' 3"  (1.6 m)   Wt 151 lb 6.4 oz (68.7 kg)   SpO2 98%   BMI 26.82 kg/m    GEN: Well nourished, well developed, in no acute distress  HEENT: normal  Neck: no JVD, carotid bruits, or masses Cardiac: RRR; no murmurs, rubs, or gallops,no edema.  Intact distal pulses bilaterally.  Respiratory:  clear to auscultation bilaterally, normal work of breathing GI: soft, nontender, nondistended, + BS MS: no deformity or atrophy  Skin: warm and dry, no rash Neuro:  Alert and Oriented x 3, Strength and sensation are intact Psych: euthymic mood, full affect  Wt Readings from Last 3 Encounters:  02/09/23 151 lb 6.4 oz (68.7 kg)  01/23/23 160 lb 7.9 oz (72.8 kg)  05/26/21 160 lb 6.4 oz (72.8 kg)      Studies/Labs Reviewed:           Recent Labs: 01/23/2023: ALT 27; BUN 9; Creatinine, Ser 0.70; Hemoglobin 13.9; Platelets 320; Potassium 3.1; Sodium 138   Lipid Panel    Component Value Date/Time   CHOL 124 04/19/2018 0904   TRIG 59 04/19/2018 0904   HDL 62 04/19/2018 0904   CHOLHDL 2.0 04/19/2018 0904   CHOLHDL 4 02/16/2017 0840   VLDL 14.6 02/16/2017 0840   LDLCALC 50 04/19/2018 0904      ASSESSMENT:    1. Dizziness   2. Essential hypertension   3. Nonrheumatic mitral valve regurgitation      PLAN:  In order of problems listed above:  Dizziness -this was her only sx when she went to the ER and her K+ was low>>repleted and was given IVF with resolution of sx and has never had any further dizziness>>suspect she was dehydrated -2D echo with normal LVF  2.  Hypertension -BP controlled on exam today -continue prescription drug  management with Amlodipine 10mg  daily, hydrochlorothiazide 25mg  daily with PRN refills -recommend coronary Ca score to determine future cardiac risk given her HTN, age, remote tobacco use and HLD>>she will let me know when she can afford to get it done  3.  Mitral Regurgitation -  mild to moderate on echo -will repeat echo in 1 year  Time Spent: 20 minutes total time of encounter, including 15 minutes spent in face-to-face patient care on the date of this encounter. This time includes coordination of care and counseling regarding above mentioned problem list. Remainder of non-face-to-face time involved reviewing chart documents/testing relevant to the patient encounter and documentation in the medical record. I have independently reviewed documentation from referring provider  Followup:  1 year  Medication Adjustments/Labs and Tests Ordered: Current medicines are reviewed at length with the patient today.  Concerns regarding medicines are outlined above.  Medication changes, Labs and Tests ordered today are listed in the Patient Instructions below.  There are no Patient Instructions on file for this visit.   Signed, Armanda Magic, MD  02/09/2023 9:14 AM    [facility] [address] [place], [place], [place]  [address] Phone: [phone]; Fax: [phone]

## 2023-04-11 ENCOUNTER — Other Ambulatory Visit (HOSPITAL_COMMUNITY): Payer: Medicare PPO

## 2023-05-02 ENCOUNTER — Other Ambulatory Visit (HOSPITAL_COMMUNITY): Payer: Self-pay

## 2023-05-02 MED ORDER — ATORVASTATIN CALCIUM 40 MG PO TABS
40.0000 mg | ORAL_TABLET | Freq: Every day | ORAL | 3 refills | Status: DC
  Filled 2023-05-02: qty 90, 90d supply, fill #0

## 2023-06-20 ENCOUNTER — Ambulatory Visit (HOSPITAL_COMMUNITY): Payer: Medicare PPO | Attending: Cardiology

## 2023-09-05 ENCOUNTER — Encounter: Payer: Self-pay | Admitting: Adult Health

## 2023-09-12 ENCOUNTER — Ambulatory Visit (HOSPITAL_COMMUNITY)
Admission: RE | Admit: 2023-09-12 | Discharge: 2023-09-12 | Disposition: A | Discharge status: Home / Self Care | Source: Ambulatory Visit | Attending: Cardiology | Admitting: Cardiology

## 2023-09-12 DIAGNOSIS — R42 Dizziness and giddiness: Secondary | ICD-10-CM | POA: Insufficient documentation

## 2023-09-14 ENCOUNTER — Telehealth: Payer: Self-pay

## 2023-09-14 NOTE — Telephone Encounter (Signed)
-----   Message from Armanda Magic sent at 09/12/2023  2:09 PM EDT ----- No coronary calcium was noted on coronary calcium scoring

## 2023-09-14 NOTE — Telephone Encounter (Signed)
 Pt called and it was forwarded to me. The patient was notified of the result and verbalized understanding.  All questions (if any) were answered. Erick Alley, RN 09/14/2023 9:41 AM

## 2023-12-05 ENCOUNTER — Other Ambulatory Visit (HOSPITAL_COMMUNITY): Payer: Self-pay

## 2023-12-05 MED ORDER — ZOLPIDEM TARTRATE 5 MG PO TABS
5.0000 mg | ORAL_TABLET | Freq: Every evening | ORAL | 3 refills | Status: AC | PRN
Start: 2023-12-05 — End: ?
  Filled 2023-12-05: qty 30, 30d supply, fill #0
  Filled 2024-01-11: qty 30, 30d supply, fill #1
  Filled 2024-02-10: qty 30, 30d supply, fill #2
  Filled 2024-03-02 – 2024-03-13 (×2): qty 30, 30d supply, fill #3

## 2023-12-08 ENCOUNTER — Other Ambulatory Visit (HOSPITAL_COMMUNITY): Payer: Self-pay

## 2024-01-09 ENCOUNTER — Telehealth: Payer: Self-pay

## 2024-01-09 DIAGNOSIS — R42 Dizziness and giddiness: Secondary | ICD-10-CM

## 2024-01-09 DIAGNOSIS — I340 Nonrheumatic mitral (valve) insufficiency: Secondary | ICD-10-CM

## 2024-01-09 DIAGNOSIS — I10 Essential (primary) hypertension: Secondary | ICD-10-CM

## 2024-01-09 NOTE — Telephone Encounter (Signed)
 Echo reordered as original echo from [facility] in 01/2023 expired.

## 2024-01-09 NOTE — Telephone Encounter (Signed)
-----   Message from Olam ORN sent at 01/09/2024 12:07 PM EDT ----- Please update or create a new order for echo-- old one has expired.

## 2024-01-12 ENCOUNTER — Other Ambulatory Visit: Payer: Self-pay

## 2024-01-16 ENCOUNTER — Other Ambulatory Visit (HOSPITAL_COMMUNITY): Payer: Self-pay

## 2024-01-19 ENCOUNTER — Other Ambulatory Visit: Payer: Self-pay

## 2024-01-19 ENCOUNTER — Ambulatory Visit (HOSPITAL_COMMUNITY): Admission: EM | Admit: 2024-01-19 | Discharge: 2024-01-19 | Disposition: A | Discharge status: Home / Self Care

## 2024-01-19 ENCOUNTER — Encounter (HOSPITAL_COMMUNITY): Payer: Self-pay | Admitting: *Deleted

## 2024-01-19 DIAGNOSIS — R002 Palpitations: Secondary | ICD-10-CM

## 2024-01-19 DIAGNOSIS — T43615A Adverse effect of caffeine, initial encounter: Secondary | ICD-10-CM | POA: Diagnosis not present

## 2024-01-19 NOTE — Discharge Instructions (Signed)
  1. Palpitations (Primary) 2. Caffeine adverse reaction, initial encounter - ED EKG performed in UC shows normal sinus rhythm with ventricular rate of 93 bpm, normal EKG, no STEMI. - Heart palpitations most likely secondary to excessive caffeine intake today. -Continue to monitor symptoms for any change in severity if there is any escalation of current symptoms or development of new symptoms follow-up in ER for further evaluation and management.

## 2024-01-19 NOTE — ED Provider Notes (Signed)
 [facility]  Note:  This document was prepared using Dragon voice recognition software and may include unintentional dictation errors.  MRN: [id number] DOB: 1950/08/28  Subjective:   Marissa Williams is a 73 y.o. female presenting for elevated heart rate and palpitations that occurred earlier today after drinking espresso in her coffee.  Patient reports that she felt a fluttering feeling and increased heart rate after drinking coffee with espresso.  Patient reports that she normally does not drink coffee but had a drink today while working and felt funny afterwards.  Patient denies any symptoms currently but was concerned due to onset of symptoms that occurred earlier.  Patient also reports she has recently been taking B12 supplements and was concerned that maybe that was causing palpitations.  Denies any shortness of breath, chest pain, weakness, dizziness.  No current facility-administered medications for this encounter.  Current Outpatient Medications:    acetaminophen  (TYLENOL  8 HOUR) 650 MG CR tablet, Take 1 tablet (650 mg total) by mouth every 8 (eight) hours as needed. Not to exceed 3 tablets in 24 hours., Disp: 270 tablet, Rfl: 3   amLODipine  (NORVASC ) 10 MG tablet, Take 1 tablet (10 mg total) by mouth daily., Disp: 90 tablet, Rfl: 3   atorvastatin  (LIPITOR) 40 MG tablet, Take 1 tablet (40 mg total) by mouth daily., Disp: 90 tablet, Rfl: 3   hydrochlorothiazide  (HYDRODIURIL ) 25 MG tablet, Take 1 tablet (25 mg total) by mouth daily., Disp: 90 tablet, Rfl: 1   zolpidem  (AMBIEN ) 5 MG tablet, Take 1 tablet (5 mg total) by mouth at bedtime as needed for insomnia., Disp: 30 tablet, Rfl: 3   amLODipine  (NORVASC ) 10 MG tablet, TAKE 1 TABLET (10 MG) BY MOUTH DAILY, Disp: 90 tablet, Rfl: 3   amLODipine  (NORVASC ) 10 MG tablet, TAKE 1 TABLET (10 MG TOTAL) BY MOUTH DAILY., Disp: 90 tablet, Rfl: 3   anastrozole  (ARIMIDEX ) 1 MG tablet, Take 1 tablet (1 mg total) by mouth daily. (Patient  not taking: Reported on 02/09/2023), Disp: 90 tablet, Rfl: 0   atorvastatin  (LIPITOR) 40 MG tablet, Take 1 tablet (40 mg total) by mouth at bedtime. (Patient not taking: Reported on 02/09/2023), Disp: 90 tablet, Rfl: 3   atorvastatin  (LIPITOR) 40 MG tablet, Take 1 tablet (40 mg total) by mouth at bedtime. (Patient not taking: Reported on 02/09/2023), Disp: 90 tablet, Rfl: 3   atorvastatin  (LIPITOR) 40 MG tablet, TAKE 1 TABLET (40 MG) BY MOUTH DAILY AT BEDTIME, Disp: 90 tablet, Rfl: 3   atorvastatin  (LIPITOR) 40 MG tablet, TAKE 1 TABLET (40 MG TOTAL) BY MOUTH DAILY., Disp: 90 tablet, Rfl: 3   atorvastatin  (LIPITOR) 40 MG tablet, Take 1 tablet (40 mg total) by mouth once daily at bedtime., Disp: 90 tablet, Rfl: 3   Calcium  Carb-Cholecalciferol  (CALCIUM  500+D HIGH POTENCY) 500-400 MG-UNIT TABS, Take 2 tablets by mouth daily., Disp: 180 tablet, Rfl: 3   diclofenac  Sodium (GOODSENSE ARTHRITIS PAIN) 1 % GEL, Apply 2 grams to left knee, twice a day as needed for joint pain., Disp: 100 g, Rfl: 1   gabapentin  (NEURONTIN ) 300 MG capsule, Take 1 capsule (300 mg total) by mouth daily. (Patient not taking: Reported on 02/09/2023), Disp: 90 capsule, Rfl: 3   gatifloxacin  (ZYMAXID ) 0.5 % SOLN, Place 1 drop into the right eye 4 (four) times daily as directed (Patient not taking: Reported on 02/09/2023), Disp: 5 mL, Rfl: 1   hydrochlorothiazide  (HYDRODIURIL ) 25 MG tablet, TAKE 1 TABLET (25 MG) BY MOUTH ONCE A DAILY, Disp: 90  tablet, Rfl: 3   hydrochlorothiazide  (HYDRODIURIL ) 25 MG tablet, TAKE 1 TABLET (25 MG TOTAL) BY MOUTH DAILY., Disp: 90 tablet, Rfl: 1   ketorolac  (ACULAR ) 0.5 % ophthalmic solution, Place 1 drop into the right eye 4 (four) times daily as directed (Patient not taking: Reported on 02/09/2023), Disp: 10 mL, Rfl: 1   Melatonin 5 MG CAPS, Take 1-2 capsules (5-10 mg total) by mouth as needed. (Patient not taking: Reported on 02/09/2023), Disp: 60 capsule, Rfl: 0   meloxicam  (MOBIC ) 15 MG tablet, Take 1 tablet  (15 mg total) by mouth daily with a meal/food (Patient not taking: Reported on 02/09/2023), Disp: 30 tablet, Rfl: 1   potassium chloride  SA (KLOR-CON  M) 20 MEQ tablet, Take 1 tablet (20 mEq total) by mouth daily. (Patient not taking: Reported on 02/09/2023), Disp: 3 tablet, Rfl: 0   prednisoLONE  acetate (PRED FORTE ) 1 % ophthalmic suspension, Place 1 drop into the right eye 4 (four) times daily as directed (Patient not taking: Reported on 02/09/2023), Disp: 5 mL, Rfl: 1   Vitamin D , Ergocalciferol , (DRISDOL) 1.25 MG (50000 UT) CAPS capsule, Take 50,000 Units by mouth every 7 (seven) days. (Patient not taking: Reported on 02/09/2023), Disp: , Rfl:    zolpidem  (AMBIEN ) 5 MG tablet, Take 1 tablet (5 mg total) by mouth daily as needed for insomnia, Disp: 15 tablet, Rfl: 1   zolpidem  (AMBIEN ) 5 MG tablet, Take 1 tablet (5 mg total) by mouth at bedtime as needed for insomnia (Patient not taking: Reported on 02/09/2023), Disp: 15 tablet, Rfl: 1   No Known Allergies  Past Medical History:  Diagnosis Date   Breast cancer of upper-inner quadrant of left female breast (HCC) 01/27/2015   Hypertension Dx 2008   Osteopenia    Personal history of radiation therapy    Radiation 03/19/15-05/02/15   left breast   Therapeutic opioid induced constipation 10/10/2019     Past Surgical History:  Procedure Laterality Date   ABDOMINAL HYSTERECTOMY  1980   uterine prolapse    BREAST LUMPECTOMY Left 2016   BREAST LUMPECTOMY WITH RADIOACTIVE SEED AND SENTINEL LYMPH NODE BIOPSY Left 02/06/2015   Procedure: LEFT BREAST LUMPECTOMY WITH RADIOACTIVE SEED AND LEFT AXILLARY SENTINEL LYMPH NODE BIOPSY;  Surgeon: Morene Olives, MD;  Location: [facility];  Service: General;  Laterality: Left;   TUBAL LIGATION  1970    Family History  Problem Relation Age of Onset   Alzheimer's disease Father    Cancer Mother        throat cancer- smoker and ETOH    Heart disease Neg Hx    Diabetes Neg Hx     Social  History   Tobacco Use   Smoking status: Never   Smokeless tobacco: Never  Substance Use Topics   Alcohol use: Yes    Alcohol/week: 21.0 standard drinks of alcohol    Types: 21 Cans of beer per week   Drug use: Not Currently    Comment: in the past cocaine. none since 2010.     ROS Refer to HPI for ROS details.  Objective:   Vitals: BP (!) 145/82   Pulse 97   Temp 97.9 F (36.6 C)   Resp 20   SpO2 100%   Physical Exam Vitals and nursing note reviewed.  Constitutional:      General: She is not in acute distress.    Appearance: Normal appearance. She is well-developed. She is not ill-appearing or toxic-appearing.  HENT:     Head: Normocephalic  and atraumatic.  Cardiovascular:     Rate and Rhythm: Normal rate and regular rhythm.     Heart sounds: Normal heart sounds. No murmur heard. Pulmonary:     Effort: Pulmonary effort is normal. No respiratory distress.     Breath sounds: Normal breath sounds. No stridor. No wheezing, rhonchi or rales.  Chest:     Chest wall: No tenderness.  Skin:    General: Skin is warm and dry.  Neurological:     General: No focal deficit present.     Mental Status: She is alert and oriented to person, place, and time.  Psychiatric:        Mood and Affect: Mood normal.        Behavior: Behavior normal.     Procedures  No results found for this or any previous visit (from the past 24 hours).  No results found.   Assessment and Plan :     Discharge Instructions       1. Palpitations (Primary) 2. Caffeine adverse reaction, initial encounter - ED EKG performed in UC shows normal sinus rhythm with ventricular rate of 93 bpm, normal EKG, no STEMI. - Heart palpitations most likely secondary to excessive caffeine intake today. -Continue to monitor symptoms for any change in severity if there is any escalation of current symptoms or development of new symptoms follow-up in ER for further evaluation and management.      Alzina Golda B  Emyah Roznowski   Kert Shackett, [place], [place] 01/19/24 1624

## 2024-01-19 NOTE — ED Triage Notes (Signed)
 PT reports she felt her heart racing all day. Pt went to work around 0930 and she felt her hear racing. Pt drank coffee with espresso in it.

## 2024-02-10 ENCOUNTER — Other Ambulatory Visit: Payer: Self-pay

## 2024-02-14 ENCOUNTER — Other Ambulatory Visit (HOSPITAL_COMMUNITY): Payer: Self-pay

## 2024-02-15 ENCOUNTER — Ambulatory Visit: Payer: Self-pay | Admitting: Cardiology

## 2024-02-15 ENCOUNTER — Other Ambulatory Visit: Payer: Self-pay | Admitting: Cardiology

## 2024-02-15 ENCOUNTER — Ambulatory Visit (HOSPITAL_COMMUNITY)
Admission: RE | Admit: 2024-02-15 | Discharge: 2024-02-15 | Disposition: A | Discharge status: Home / Self Care | Source: Ambulatory Visit | Attending: Internal Medicine | Admitting: Internal Medicine

## 2024-02-15 DIAGNOSIS — I340 Nonrheumatic mitral (valve) insufficiency: Secondary | ICD-10-CM

## 2024-02-15 DIAGNOSIS — R42 Dizziness and giddiness: Secondary | ICD-10-CM | POA: Diagnosis present

## 2024-02-15 DIAGNOSIS — Z01812 Encounter for preprocedural laboratory examination: Secondary | ICD-10-CM

## 2024-02-15 DIAGNOSIS — I10 Essential (primary) hypertension: Secondary | ICD-10-CM | POA: Diagnosis not present

## 2024-02-15 LAB — ECHOCARDIOGRAM SQUAT TO STAND
Area-P 1/2: 3.31 cm2
S' Lateral: 2.3 cm

## 2024-02-16 NOTE — Telephone Encounter (Signed)
 Call to patient to advise that echo showed normal pumping function of the heart muscle EF 65% with increased stiffness of the heart muscle called diastolic dysfunction.  There is a possible abnormal placement of a papillary musclehat results in a  band of muscle tissue across the left ventricular outflow tract where the blood is pushed out of the heart across the aortic valve. Patient agrees to  cardiac MRI with contrast to further assess.

## 2024-02-16 NOTE — Telephone Encounter (Signed)
-----   Message from Wilbert Bihari sent at 02/15/2024  7:53 PM EDT ----- Echo showed normal pumping function of the heart muscle EF 65% with increase stiffness of the heart muscle called diastolic dysfunction.  There is a possible abnormal placement of a papillary muscle  that results in a  band of muscle tissue across the left ventricular outflow tract where the blood is pushed out of the heart across the aortic valve.  Please order a cardiac MRI with contrast to  further assess ----- Message ----- From: Interface, Three One Seven Sent: 02/15/2024   7:16 PM EDT To: Wilbert JONELLE Bihari, MD

## 2024-02-20 ENCOUNTER — Encounter (HOSPITAL_COMMUNITY): Payer: Self-pay

## 2024-02-21 ENCOUNTER — Other Ambulatory Visit: Payer: Self-pay | Admitting: Cardiology

## 2024-02-21 ENCOUNTER — Ambulatory Visit (HOSPITAL_COMMUNITY)
Admission: RE | Admit: 2024-02-21 | Discharge: 2024-02-21 | Disposition: A | Discharge status: Home / Self Care | Source: Ambulatory Visit | Attending: Cardiology | Admitting: Cardiology

## 2024-02-21 ENCOUNTER — Ambulatory Visit: Payer: Self-pay | Admitting: Cardiology

## 2024-02-21 DIAGNOSIS — I340 Nonrheumatic mitral (valve) insufficiency: Secondary | ICD-10-CM | POA: Diagnosis not present

## 2024-02-21 MED ORDER — GADOBUTROL 1 MMOL/ML IV SOLN
10.0000 mL | Freq: Once | INTRAVENOUS | Status: AC | PRN
  Administered 2024-02-21: 10 mL via INTRAVENOUS

## 2024-02-22 ENCOUNTER — Other Ambulatory Visit: Payer: Self-pay

## 2024-02-22 DIAGNOSIS — I421 Obstructive hypertrophic cardiomyopathy: Secondary | ICD-10-CM

## 2024-02-24 NOTE — Telephone Encounter (Signed)
-----   Message from Wilbert Bihari sent at 02/21/2024  2:41 PM EDT ----- Cardiac MRI showed normal heart function with increased thickness of heart muscle in the basal septum.  The normal anterior papillary muscle in the LV that was of concern in the normal echo is  prominent but not obstructing blood flow out of the heart.  I would like her to be seen in HOCM clinic just for Dr. Santo to give his blessing that this is not atypical HOCM ----- Message ----- From: Interface, Rad Results In Sent: 02/21/2024   1:38 PM EDT To: Wilbert JONELLE Bihari, MD

## 2024-02-24 NOTE — Telephone Encounter (Signed)
 Call to patient to advise Cardiac MRI showed normal heart function with increased thickness of heart muscle in the basal septum. The normal anterior papillary muscle in the LV that was of concern in the normal echo is prominent but not obstructing blood flow out of the heart. Pateint agrees to be seen in HOCM clinic just for Dr. Santo to give his blessing that this is not atypical HOCM, referral placed.

## 2024-03-02 ENCOUNTER — Other Ambulatory Visit (HOSPITAL_COMMUNITY): Payer: Self-pay

## 2024-03-13 ENCOUNTER — Other Ambulatory Visit: Payer: Self-pay

## 2024-03-23 ENCOUNTER — Emergency Department (HOSPITAL_COMMUNITY)

## 2024-03-23 ENCOUNTER — Emergency Department (HOSPITAL_COMMUNITY)
Admission: EM | Admit: 2024-03-23 | Discharge: 2024-03-23 | Disposition: A | Discharge status: Home / Self Care | Attending: Emergency Medicine | Admitting: Emergency Medicine

## 2024-03-23 DIAGNOSIS — S8992XA Unspecified injury of left lower leg, initial encounter: Secondary | ICD-10-CM | POA: Diagnosis present

## 2024-03-23 DIAGNOSIS — S8392XA Sprain of unspecified site of left knee, initial encounter: Secondary | ICD-10-CM

## 2024-03-23 DIAGNOSIS — W010XXA Fall on same level from slipping, tripping and stumbling without subsequent striking against object, initial encounter: Secondary | ICD-10-CM | POA: Insufficient documentation

## 2024-03-23 NOTE — ED Triage Notes (Signed)
 Fell on left knee yesterday with pain and swelling. Difficulty bearing weight and bending the extremity.

## 2024-03-23 NOTE — ED Notes (Signed)
 Reviewed discharge instructions with pt who verbalized understanding.

## 2024-03-23 NOTE — Discharge Instructions (Addendum)
 You may use ibuprofen, Tylenol , ice to help with your left knee sprain.  If you notice new or worsening pain, inability to walk or bear weight, or any other new/concerning symptoms then return to the ER.

## 2024-03-23 NOTE — ED Provider Notes (Signed)
 [place] EMERGENCY DEPARTMENT AT [facility] Provider Note   CSN: [id number] Arrival date & time: 03/23/24  9055     Patient presents with: Knee Pain   Marissa Williams is a 73 y.o. female.   HPI 73 year old female presents with left knee injury.  Yesterday she tripped and fell and landed on her left knee.  No other injuries.  No numbness or weakness to her leg.  She is having difficulty bearing weight because of the pain it causes.  She also has a hard time flexing her knee.  It is also swollen.  Prior to Admission medications   Medication Sig Start Date End Date Taking? Authorizing Provider  acetaminophen  (TYLENOL  8 HOUR) 650 MG CR tablet Take 1 tablet (650 mg total) by mouth every 8 (eight) hours as needed. Not to exceed 3 tablets in 24 hours. 10/25/22   Sharyne Harlene CROME, NP  amLODipine  (NORVASC ) 10 MG tablet Take 1 tablet (10 mg total) by mouth daily. 03/25/20 01/19/24  Bloomfield, Rumaldo D, DO  amLODipine  (NORVASC ) 10 MG tablet TAKE 1 TABLET (10 MG) BY MOUTH DAILY 08/01/20 08/01/21  Campbell Reynolds, NP  amLODipine  (NORVASC ) 10 MG tablet TAKE 1 TABLET (10 MG TOTAL) BY MOUTH DAILY. 03/25/20 03/25/21  Bloomfield, Carley D, DO  anastrozole  (ARIMIDEX ) 1 MG tablet Take 1 tablet (1 mg total) by mouth daily. Patient not taking: Reported on 02/09/2023 04/21/20   Magrinat, Sandria BROCKS, MD  atorvastatin  (LIPITOR) 40 MG tablet Take 1 tablet (40 mg total) by mouth daily. 02/12/20   Golda Lynwood PARAS, MD  atorvastatin  (LIPITOR) 40 MG tablet Take 1 tablet (40 mg total) by mouth at bedtime. Patient not taking: Reported on 02/09/2023 07/31/21     atorvastatin  (LIPITOR) 40 MG tablet Take 1 tablet (40 mg total) by mouth at bedtime. Patient not taking: Reported on 02/09/2023 06/16/22     atorvastatin  (LIPITOR) 40 MG tablet TAKE 1 TABLET (40 MG) BY MOUTH DAILY AT BEDTIME 08/01/20 08/01/21  Campbell Reynolds, NP  atorvastatin  (LIPITOR) 40 MG tablet TAKE 1 TABLET (40 MG TOTAL) BY MOUTH DAILY. 02/12/20 02/11/21   Golda Lynwood PARAS, MD  atorvastatin  (LIPITOR) 40 MG tablet Take 1 tablet (40 mg total) by mouth once daily at bedtime. 05/02/23     Calcium  Carb-Cholecalciferol  (CALCIUM  500+D HIGH POTENCY) 500-400 MG-UNIT TABS Take 2 tablets by mouth daily. 12/17/20     diclofenac  Sodium (GOODSENSE ARTHRITIS PAIN) 1 % GEL Apply 2 grams to left knee, twice a day as needed for joint pain. 10/25/22     gabapentin  (NEURONTIN ) 300 MG capsule Take 1 capsule (300 mg total) by mouth daily. Patient not taking: Reported on 02/09/2023 12/17/20     gatifloxacin  (ZYMAXID ) 0.5 % SOLN Place 1 drop into the right eye 4 (four) times daily as directed Patient not taking: Reported on 02/09/2023 07/27/22   Lavonia Lye, MD  hydrochlorothiazide  (HYDRODIURIL ) 25 MG tablet Take 1 tablet (25 mg total) by mouth daily. 05/15/20   Mathew Bread, MD  hydrochlorothiazide  (HYDRODIURIL ) 25 MG tablet TAKE 1 TABLET (25 MG) BY MOUTH ONCE A DAILY 08/01/20 08/01/21  Campbell Reynolds, NP  hydrochlorothiazide  (HYDRODIURIL ) 25 MG tablet TAKE 1 TABLET (25 MG TOTAL) BY MOUTH DAILY. 05/15/20 05/15/21  Mathew Bread, MD  ketorolac  (ACULAR ) 0.5 % ophthalmic solution Place 1 drop into the right eye 4 (four) times daily as directed Patient not taking: Reported on 02/09/2023 07/27/22   Lavonia Lye, MD  Melatonin 5 MG CAPS Take 1-2 capsules (5-10 mg total) by  mouth as needed. Patient not taking: Reported on 02/09/2023 02/23/21     meloxicam  (MOBIC ) 15 MG tablet Take 1 tablet (15 mg total) by mouth daily with a meal/food Patient not taking: Reported on 02/09/2023 11/01/22     potassium chloride  SA (KLOR-CON  M) 20 MEQ tablet Take 1 tablet (20 mEq total) by mouth daily. Patient not taking: Reported on 02/09/2023 01/23/23   Nivia Colon, PA-C  prednisoLONE  acetate (PRED FORTE ) 1 % ophthalmic suspension Place 1 drop into the right eye 4 (four) times daily as directed Patient not taking: Reported on 02/09/2023 07/27/22   Lavonia Lye, MD  Vitamin D , Ergocalciferol , (DRISDOL) 1.25 MG  (50000 UT) CAPS capsule Take 50,000 Units by mouth every 7 (seven) days. Patient not taking: Reported on 02/09/2023    [provider]  zolpidem  (AMBIEN ) 5 MG tablet Take 1 tablet (5 mg total) by mouth daily as needed for insomnia 03/08/22     zolpidem  (AMBIEN ) 5 MG tablet Take 1 tablet (5 mg total) by mouth at bedtime as needed for insomnia Patient not taking: Reported on 02/09/2023 05/12/22     zolpidem  (AMBIEN ) 5 MG tablet Take 1 tablet (5 mg total) by mouth at bedtime as needed for insomnia. 12/05/23       Allergies: Patient has no known allergies.    Review of Systems  Musculoskeletal:  Positive for arthralgias and joint swelling.  Neurological:  Negative for weakness and numbness.    Updated Vital Signs BP (!) 144/73 (BP Location: Left Arm)   Pulse 95   Temp 98.1 F (36.7 C) (Oral)   Resp 18   SpO2 100%   Physical Exam Vitals and nursing note reviewed.  Constitutional:      Appearance: She is well-developed.  HENT:     Head: Normocephalic and atraumatic.  Cardiovascular:     Rate and Rhythm: Normal rate and regular rhythm.     Pulses:          Dorsalis pedis pulses are 2+ on the left side.  Pulmonary:     Effort: Pulmonary effort is normal.  Musculoskeletal:     Left hip: No tenderness. Normal range of motion.     Left upper leg: No tenderness.     Left knee: Swelling present. No deformity. Decreased range of motion. Tenderness present.     Left lower leg: No tenderness.     Left ankle: No tenderness.     Comments: Is able to straight leg raise without difficulty  Skin:    General: Skin is warm and dry.  Neurological:     Mental Status: She is alert.     (all labs ordered are listed, but only abnormal results are displayed) Labs Reviewed - No data to display  EKG: None  Radiology: DG Knee Complete 4 Views Left Result Date: 03/23/2024 EXAM: 4 or more VIEW(S) XRAY OF THE LEFT KNEE 03/23/2024 COMPARISON: 01/28/2020 CLINICAL HISTORY: Fall with  swelling. FINDINGS: BONES AND JOINTS: No acute fracture. No focal osseous lesion. No joint dislocation. Small effusion in the suprapatellar bursa. Mild narrowing of the articular cartilage in the medial compartment. SOFT TISSUES: The soft tissues are unremarkable. IMPRESSION: 1. Small suprapatellar joint effusion. 2. Mild medial compartment articular cartilage narrowing. Electronically signed by: Dayne Hassell MD 03/23/2024 11:16 AM EDT RP Workstation: [id number]     Procedures   Medications Ordered in the ED - No data to display  Medical Decision Making Amount and/or Complexity of Data Reviewed Radiology: ordered and independent interpretation performed.    Details: No fracture   Patient likely has a mild knee sprain.  She is able to ambulate but it is painful.  She was given ice but declined thing for pain.  She has declined anything such as an Ace wrap.  No ligament laxity appreciated.  Discussed follow-up with PCP if not improving, otherwise can use ice, Tylenol , NSAIDs.  Will give return precautions, appears stable for discharge.  Seems like a traumatic injury, very low suspicion for a septic joint.     Final diagnoses:  Sprain of left knee, unspecified ligament, initial encounter    ED Discharge Orders     None          Freddi Hamilton, MD 03/23/24 1219

## 2024-04-16 ENCOUNTER — Ambulatory Visit: Admitting: Genetic Counselor

## 2024-04-16 ENCOUNTER — Ambulatory Visit: Attending: Internal Medicine | Admitting: Internal Medicine

## 2024-04-16 ENCOUNTER — Encounter: Admitting: Genetic Counselor

## 2024-04-16 VITALS — BP 130/70 | HR 73 | Ht 63.0 in | Wt 149.0 lb

## 2024-04-16 DIAGNOSIS — I421 Obstructive hypertrophic cardiomyopathy: Secondary | ICD-10-CM

## 2024-04-16 DIAGNOSIS — I10 Essential (primary) hypertension: Secondary | ICD-10-CM

## 2024-04-16 DIAGNOSIS — I700 Atherosclerosis of aorta: Secondary | ICD-10-CM | POA: Insufficient documentation

## 2024-04-16 DIAGNOSIS — Q248 Other specified congenital malformations of heart: Secondary | ICD-10-CM | POA: Diagnosis not present

## 2024-04-16 NOTE — Progress Notes (Signed)
 Cardiology Office Note:  .    Date:  04/16/2024  ID:  Marissa Williams, DOB 1951/02/07, MRN [id number] PCP: [place] Reynolds, NP  [facility] Providers Cardiologist:  Stanly DELENA Leavens, MD     CC: Papillary muscle abnormality Consulted for the evaluation of HCM at the behest of Dr. Shlomo   History of Present Illness: .    Marissa Williams is a 73 y.o. female  who presents for evaluation of inducible gradient and papillary muscle abnormalities. She was referred by her primary doctor for evaluation of heart-related concerns following a test conducted three years ago.  She has a history of left ventricular hypertrophy and aortic atherosclerosis. She has undergone prior cardiac workups. She underwent radiation therapy for breast cancer in 2016, receiving a total dose of over 30 gray. Three years ago, she was informed about a heart condition, which led to her current cardiology evaluation.  No symptoms such as chest pain, palpitations, or syncope. She remains active, working part-time and engaging in activities like dancing with her family. She recently injured her knee while running but reports no significant issues from the fall.  Her current medications include atorvastatin , amlodipine , and hydrochlorothiazide . Her last cholesterol check was in 2019.  No family history of heart problems or sudden cardiac events. Her mother had throat cancer.  Discussed the use of AI scribe software for clinical note transcription with the patient, who gave verbal consent to proceed.   Relevant histories: .  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 Has three living children, one deceased child, seven grandchildren, and is expecting a fifth great-grandchild.  ROS: As per HPI.   Studies Reviewed: .     Cardiac Studies & Procedures    ______________________________________________________________________________________________          CT SCANS  CT CARDIAC SCORING (SELF PAY ONLY) 09/12/2023  Addendum 09/18/2023 10:23 PM ADDENDUM REPORT: 09/18/2023 22:21  EXAM: OVER-READ INTERPRETATION  CT CHEST  The following report is an over-read performed by radiologist Dr. Andrea Gasman of [facility] on 09/18/2023. This over-read does not include interpretation of cardiac or coronary anatomy or pathology. The coronary calcium  score interpretation by the cardiologist is attached.  COMPARISON:  None.  FINDINGS: Vascular: Mild aortic atherosclerosis. The ascending aorta is upper normal at 3.8 cm.  Mediastinum/nodes: No adenopathy or mass. Unremarkable esophagus.  Lungs: Subsegmental atelectasis/scarring in the lingula. No pulmonary nodule. No pleural fluid. The included airways are patent.  Upper abdomen: No acute or unexpected findings.  Musculoskeletal: There are no acute or suspicious osseous abnormalities.  IMPRESSION: Aortic Atherosclerosis (ICD10-I70.0).   Electronically Signed By: Andrea Gasman M.D. On: 09/18/2023 22:21  Narrative CLINICAL DATA:  Risk stratification  EXAM: Coronary Calcium  Score  TECHNIQUE: The patient was scanned on a Siemens Somatom 64 slice scanner. Axial non-contrast 3mm slices were carried out through the heart. The data set was analyzed on a dedicated work station and scored using the Agatson method.  FINDINGS: Non-cardiac: No significant non cardiac findings on limited lung and soft tissue windows. See separate report from [facility].  Ascending Aorta: Mildly dilated 3.8 cm  Pericardium: Normal  Coronary arteries: No calcium  noted  IMPRESSION: Coronary calcium  score of 0.  Maude Emmer  Electronically Signed: By: Maude Emmer M.D. On: 09/12/2023 11:28   CARDIAC MRI  MR CARDIAC MORPHOLOGY W WO CONTRAST  02/21/2024  Narrative CLINICAL DATA:  LV outflow obstruction, abnormal papillary muscle by echo  EXAM: CARDIAC MRI  TECHNIQUE: The patient was scanned on a 1.5 Tesla GE magnet. A dedicated cardiac coil was used. Functional imaging was done using Fiesta sequences. 2,3, and 4 chamber views were done to assess for RWMA's. Modified Simpson's rule using a short axis stack was used to calculate an ejection fraction on a dedicated work [employer]. The patient received 8 cc of Gadavist . After 10 minutes inversion recovery sequences were used to assess for infiltration and scar tissue.  FINDINGS: Limited images of the lung fields showed no gross abnormalities.  Normal left ventricular size with mild focal basal septal hypertrophy. The anterolateral papillary muscle is prominent, does not appear obstructive on this study. Vigorous LV systolic function with normal wall motion, LV EF 68%. Normal right ventricular size with RV EF 54%. Normal left and right atrial sizes. Trileaflet aortic valve with no significant stenosis or regurgitation. By regurgitant fraction (<5%), there was minimal mitral regurgitation.  On delayed enhancement imaging, there was no myocardial late gadolinium enhancement (LGE).  Measurements: LVEDV 96 mL  LVEDVi 55 mL/m2  LVSV 66 mL  LVEF 68%  RVEDV 97 mL  RVEDVi 56 mL/m2  RVSV 53 mL RVEF 54%  Aortic forward volume 65 mL  Aortic regurgitant fraction 1%  Global T1 994, ECV 28%  Global T2 50 (normal)  IMPRESSION: 1. Normal LV size with mild focal basal septal hypertrophy. Vigorous systolic function with LV EF 68%. The anterolateral papillary muscle is somewhat prominent in appearance, but I do not see evidence for significant obstruction in this study (no turbulent flow in LV outflow tract, papillary muscle does not appear to move into the LVOT).  2.  Normal RV size and systolic function, RV EF 54%.  3. No myocardial LGE, so  no evidence for prior MI, infiltrative disease, or myocarditis.  4.  Mitral regurgitation looks minimal.  5.  Normal extracellular volume percentage.  Cannot rule out benign-appearing hypertrophic cardiomyopathy variant, but morphology of LV could alternatively be due to long-standing hypertension.  Dalton Mclean   Electronically Signed By: Ezra Shuck M.D. On: 02/21/2024 13:36   ______________________________________________________________________________________________        Physical Exam:    VS:  BP 130/70   Pulse 73   Ht 5' 3 (1.6 m)   Wt 149 lb (67.6 kg)   SpO2 98%   BMI 26.39 kg/m    Wt Readings from Last 3 Encounters:  04/16/24 149 lb (67.6 kg)  02/09/23 151 lb 6.4 oz (68.7 kg)  01/23/23 160 lb 7.9 oz (72.8 kg)    Gen: no distress   Neck: No JVD Cardiac: No Rubs or Gallops, systolic murmur worse with Valsalva, RRR +2 radial pulses Respiratory: Clear to auscultation bilaterally, normal effort, normal  respiratory rate GI: Soft, nontender, non-distended  MS: No  edema;  moves all extremities Integument: Skin feels warm Neuro:  At time of evaluation, alert and oriented to person/place/time/situation  Psych: Normal affect, patient feels ok     ASSESSMENT AND PLAN: .    Hypertrophic Cardiomyopathy (possible) - Maximal thickness 13 mm on PSAX, 14.5 on 3 chamber CMR;  - peak gradient 27 mm Hg  - with abnormal papillary muscle with hypertrophy - suspicion of Fabry's/Danon/Noonan's or other mimics of HCM: high - Gene variant: Pending - NYHA I  - Non HCM Contributors to disease/status  Hyperlipidemia Aortic atherosclerosis Managed with atorvastatin . Last cholesterol labs from 2019. Importance of maintaining cholesterol control  emphasized, especially given history of breast cancer and radiation therapy. - Will review cholesterol labs after PCP visit - Will adjust atorvastatin  dosage if cholesterol levels remain elevated to 80 mg - reviewed CT with  patient  Hypertension Well controlled with current medications (amlodipine  and hydrochlorothiazide ).  Aortic atherosclerosis HLD Present without coronary artery calcifications. No immediate intervention required. Importance of cholesterol management emphasized to prevent progression. - Continue current management and monitor cholesterol levels.  SCD  Assessment - SCD risk estimated to be 0.61 at 5 years SDM: we have discussed age greater than 56, we do not plan to repeat testing  Atrial fibrillation Assessment - HCM-AF score 17 - Atrial arrhythmia management: heart monitor in 2026  Medication symptom plan - Abnormal papillary muscle anatomy with possible nonobstructive hypertrophic cardiomyopathy. No severe obstruction on squat-stand imaging. Asymptomatic with no family history of sudden cardiac death. Low risk of serious complications due to age and lack of symptoms. Differential includes hypertrophic cardiomyopathy, but not definitive. Conservative approach of presuming hypertrophic cardiomyopathy is reasonable. Gene testing may clarify diagnosis but unlikely to change management. Family screening recommended if hypertrophic cardiomyopathy is confirmed. - Will consider genetic testing for hypertrophic cardiomyopathy. - Monitor for symptoms such as shortness of breath or syncope. - Educated family on potential need for screening if hypertrophic cardiomyopathy is confirmed. - Provided educational materials on hypertrophic cardiomyopathy.  One year with me or Dr. Shlomo  Time:   I have spent a total of 42 minutes with the patient reviewing notes, imaging, EKGs, labs, and examining the patient as well as establishing an assessment and plan that was discussed personally with the patient. Discussed disease state education, using shared decision making tools and cardiac modeling. Reviewed care and plan in collaboration with clinical genetics    Stanly Leavens, MD FASE  [facility] Cardiologist [facility]  [address], [address] [place], [place] [address] [phone]  2:06 PM

## 2024-04-16 NOTE — Patient Instructions (Signed)
 Medication Instructions:  Your physician recommends that you continue on your current medications as directed. Please refer to the Current Medication list given to you today.  *If you need a refill on your cardiac medications before your next appointment, please call your pharmacy*  Follow-Up: At [facility], you and your health needs are our priority.  As part of our continuing mission to provide you with exceptional heart care, our providers are all part of one team.  This team includes your primary Cardiologist (physician) and Advanced Practice Providers or APPs (Physician Assistants and Nurse Practitioners) who all work together to provide you with the care you need, when you need it.  Your next appointment:   1 year  Provider:   Stanly DELENA Leavens, MD

## 2024-04-17 NOTE — Progress Notes (Signed)
 Pre Test Genetic Consult  Referral Reason  Marissa Williams, a new HCM patient, is referred for genetic consult and testing of hypertrophic cardiomyopathy.   Personal Medical Information Marissa Williams (III.1 on pedigree) is a 73 year old African American who works at [employer]. She was referred to cardiology in 2024 by her PCP for lightheadedness. She had an echocardiogram (07/13/2022) that detected mild concentric hypertrophy with LVEF of 55%- 60%, mild aortic valve leaflet thickening and no aortic valve stenosis.  She reports going to the ER complaining of heart palpitations that occurred after drinking espresso. 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  Cardiac MRI (02/21/24) demonstrated mild focal basal septal hypertrophy with prominent anterolateral papillary muscle, LVEF 68% and no scar burden.  She denies having symptoms or any functional limitations.  Traditional Risk Factors Marissa Williams was diagnosed with HTN at 45 and states that it is well-controlled with medication.  Family history  Relation to Proband Pedigree # Current age Heart condition/age of onset Notes  Daughter IV.1 39 None Echo/EKG to be done  Sons, 2 IV.2, IV.3 57, 55 None Echo/EKG to be done  Grandchildren, 6 V.1-V.6 40s- 28 None         Father II.1 Deceased None Died @ 41- Alzheimer's disease  Paternal grandfather 1.1 Deceased None Died @ ? of  ?  Paternal grandmother I.2 Deceased None Died @ ? - childbirth        Mother II.2 Deceased None Died @ 75- throat cancer  Maternal aunt II.3 Deceased None Died @ of ?  Maternal grandfather I.3 Deceased None Died @ 4s old age  Maternal grandmother I.4 Deceased None Died @ ? of  ?   Genetics Marissa Williams was counseled on the genetics of hypertrophic cardiomyopathy (HCM). I explained to the patient that this is an autosomal dominant condition with incomplete  penetrance i.e. not all individuals harboring the HCM mutation will present clinically with HCM, and age-related penetrance where clinical presentation of HCM increases with advanced age. Variability in clinical expression is also seen in families with HCM with affected family members presenting clinically at different ages and with symptoms ranging from mild to severe.  Since HCM is an autosomal dominant condition, first degree-relatives are at a 50% risk of inheriting this condition. They should seek regular surveillance for HCM.  First-degree relatives include her daughter and two sons. At this time her grandchildren do not need to undergo screening- they can be screened if they are symptomatic or if their parent is found to have HCM.    Clinical screening of first-degree relatives involves echocardiogram and EKG at regular intervals, frequency is typically determined by age, with children undergoing screening every year until the age of 32 and those over the age of 8 getting screened every 3-5 years until the age of 49. Patient verbalized understanding of this.  Also briefly discussed the inheritance pattern and treatment /management plans for the infiltrative cardiomyopathies that present as HCM phenocopies. About 8-10% of HCM patients can have compound and digenic sarcomeric mutations for HCM  Patient should be aware that genetic testing is a probabilistic test dependent upon age and severity of presentation, presence of risk factors for HCM and importantly family history of HCM or sudden death in first-degree relatives. The potential outcomes of genetic testing and subsequent management of at-risk family members is listed below-  If a mutation is not identified  then it is important that he understands that HCM is a genetic condition and can be passed down to his children. All first-degree relatives should undergo regular screening for HCM.  A negative test result can be due to limitations of the  genetic test.   There is also the likelihood of identifying a "Variant of unknown significance". This result means that the variant has not been detected in a statistically significant number of HCM patients and/or functional studies have not been performed to verify its pathogenicity. This VUS can be tested in the family to see if it segregates with disease. If a VUS is found, first-degree relatives should undergo regular clinical screening for HCM, but genetic testing for the VUS is otherwise not warranted.  If a pathogenic variant is reported, then first-degree family members can get tested for this variant. If they test positive, it is likely they will develop HCM. In light of variable expression and incomplete penetrance associated with HCM, it is not possible to predict when they will manifest clinically with HCM. It is recommended that family members that test positive for the familial pathogenic variant pursue clinical screening for HCM. Family members that test negative for the familial mutation need not pursue periodic screening for HCM, but seek care if symptoms develop.   Impression  Marissa Williams was found to have cardiac wall thickness suggestive of HCM at age 21 with LV morphology indicative of long-standing HTN. There is no family history of HCM or sudden death.   Genetic testing is recommended to confirm her diagnosis. This test should include the major sarcomeric genes involved in HCM, namely MYBPPC3, MYH7, TNNI3, TNNT2, TPM1, ACTC1, MYL2 and MYL3. It should also include the genes involved in HCM phenocopies as cardiac-predominant forms of these conditions present clinically as HCM. These include genes for Fabry disease (GLA), Danon disease (LAMP2), WPW syndrome (PRKAG2), Familial transthyretin amyloidosis (TTR) and the genes definitively associated with HCM, namely phospholamban (PLN) and desmin (DES).  In addition, patient should be aware of protections afforded by the Genetic Information  Non-Discrimination Act (GINA). GINA protects a patient from losing their employment or health insurance based on their genotype. However, these protections do not cover life insurance and disability. Explained to the patient that family members that are found to have the familial genetic mutation will be denied life insurance even if they are asymptomatic and do not exhibit clinical signs of HCM. Patient verbalized understanding and will discuss this with their family.    Please note that the patient has not been counseled in this visit on other personal, cultural or ethical issues that patient may face due to their heart condition.   Plan After a thorough discussion of the risk and benefits of genetic testing for HCM, Marissa Williams declines genetic testing due to insurance non-coverage for her HCM test. She will discuss HCM screening guidelines and procuring life insurance with her kids.     Danford Pac, Ph.D, [facility] Clinical Molecular Geneticist
